# Patient Record
Sex: Male | Born: 1935 | Race: White | Hispanic: No | Marital: Married | Smoking: Former smoker
Health system: Southern US, Community
[De-identification: ages and names within clinical notes are randomized; demographics above are authoritative.]

## PROBLEM LIST (undated history)

## (undated) DIAGNOSIS — I10 Essential (primary) hypertension: Secondary | ICD-10-CM

## (undated) DIAGNOSIS — E785 Hyperlipidemia, unspecified: Secondary | ICD-10-CM

## (undated) DIAGNOSIS — K648 Other hemorrhoids: Secondary | ICD-10-CM

## (undated) DIAGNOSIS — N400 Enlarged prostate without lower urinary tract symptoms: Secondary | ICD-10-CM

## (undated) DIAGNOSIS — K449 Diaphragmatic hernia without obstruction or gangrene: Secondary | ICD-10-CM

## (undated) DIAGNOSIS — K219 Gastro-esophageal reflux disease without esophagitis: Secondary | ICD-10-CM

## (undated) DIAGNOSIS — J189 Pneumonia, unspecified organism: Secondary | ICD-10-CM

## (undated) DIAGNOSIS — K635 Polyp of colon: Secondary | ICD-10-CM

## (undated) DIAGNOSIS — R3129 Other microscopic hematuria: Secondary | ICD-10-CM

## (undated) HISTORY — PX: NOSE SURGERY: SHX723

## (undated) HISTORY — DX: Benign prostatic hyperplasia without lower urinary tract symptoms: N40.0

## (undated) HISTORY — DX: Pneumonia, unspecified organism: J18.9

## (undated) HISTORY — DX: Other microscopic hematuria: R31.29

## (undated) HISTORY — DX: Hyperlipidemia, unspecified: E78.5

## (undated) HISTORY — PX: TONSILLECTOMY: SUR1361

## (undated) HISTORY — DX: Diaphragmatic hernia without obstruction or gangrene: K44.9

## (undated) HISTORY — DX: Polyp of colon: K63.5

## (undated) HISTORY — PX: WISDOM TOOTH EXTRACTION: SHX21

## (undated) HISTORY — DX: Essential (primary) hypertension: I10

## (undated) HISTORY — DX: Other hemorrhoids: K64.8

## (undated) HISTORY — PX: COLONOSCOPY: SHX174

## (undated) HISTORY — DX: Gastro-esophageal reflux disease without esophagitis: K21.9

---

## 1996-08-25 ENCOUNTER — Encounter: Payer: Self-pay | Admitting: Internal Medicine

## 1999-03-27 DIAGNOSIS — R3129 Other microscopic hematuria: Secondary | ICD-10-CM

## 1999-03-27 HISTORY — DX: Other microscopic hematuria: R31.29

## 1999-09-04 ENCOUNTER — Encounter: Payer: Self-pay | Admitting: Internal Medicine

## 1999-10-05 ENCOUNTER — Encounter: Payer: Self-pay | Admitting: Internal Medicine

## 2001-03-26 HISTORY — PX: UPPER GASTROINTESTINAL ENDOSCOPY: SHX188

## 2001-06-23 ENCOUNTER — Encounter: Payer: Self-pay | Admitting: Internal Medicine

## 2001-07-03 ENCOUNTER — Encounter (INDEPENDENT_AMBULATORY_CARE_PROVIDER_SITE_OTHER): Payer: Self-pay | Admitting: Gastroenterology

## 2001-08-15 ENCOUNTER — Encounter: Payer: Self-pay | Admitting: Internal Medicine

## 2002-09-30 ENCOUNTER — Encounter: Payer: Self-pay | Admitting: Internal Medicine

## 2004-02-04 ENCOUNTER — Ambulatory Visit: Payer: Self-pay | Admitting: Internal Medicine

## 2004-03-14 ENCOUNTER — Ambulatory Visit: Payer: Self-pay | Admitting: Internal Medicine

## 2004-07-24 ENCOUNTER — Ambulatory Visit: Payer: Self-pay | Admitting: Internal Medicine

## 2004-12-11 ENCOUNTER — Ambulatory Visit: Payer: Self-pay | Admitting: Gastroenterology

## 2004-12-28 ENCOUNTER — Ambulatory Visit: Payer: Self-pay | Admitting: Gastroenterology

## 2005-01-04 ENCOUNTER — Ambulatory Visit: Payer: Self-pay | Admitting: Internal Medicine

## 2005-04-10 ENCOUNTER — Ambulatory Visit: Payer: Self-pay | Admitting: Internal Medicine

## 2005-04-11 ENCOUNTER — Encounter: Payer: Self-pay | Admitting: Internal Medicine

## 2005-04-11 ENCOUNTER — Encounter: Admission: RE | Admit: 2005-04-11 | Discharge: 2005-04-11 | Payer: Self-pay | Admitting: Internal Medicine

## 2005-05-14 ENCOUNTER — Ambulatory Visit: Payer: Self-pay | Admitting: Internal Medicine

## 2005-05-18 ENCOUNTER — Encounter: Admission: RE | Admit: 2005-05-18 | Discharge: 2005-05-18 | Payer: Self-pay | Admitting: Internal Medicine

## 2006-01-16 ENCOUNTER — Ambulatory Visit: Payer: Self-pay | Admitting: Internal Medicine

## 2006-02-11 ENCOUNTER — Ambulatory Visit: Payer: Self-pay | Admitting: Internal Medicine

## 2006-02-11 LAB — CONVERTED CEMR LAB
ALT: 90 units/L — ABNORMAL HIGH (ref 0–40)
AST: 74 units/L — ABNORMAL HIGH (ref 0–37)
Albumin: 3.2 g/dL — ABNORMAL LOW (ref 3.5–5.2)
Basophils Absolute: 0 10*3/uL (ref 0.0–0.1)
Hemoglobin: 12.3 g/dL — ABNORMAL LOW (ref 13.0–17.0)
Lymphocytes Relative: 21.6 % (ref 12.0–46.0)
MCHC: 32.8 g/dL (ref 30.0–36.0)
MCV: 93.2 fL (ref 78.0–100.0)
Monocytes Relative: 9.5 % (ref 3.0–11.0)
Neutro Abs: 3 10*3/uL (ref 1.4–7.7)
Platelets: 218 10*3/uL (ref 150–400)
RBC: 4.04 M/uL — ABNORMAL LOW (ref 4.22–5.81)
WBC: 4.8 10*3/uL (ref 4.5–10.5)

## 2006-02-18 ENCOUNTER — Ambulatory Visit: Payer: Self-pay | Admitting: Internal Medicine

## 2006-02-18 LAB — CONVERTED CEMR LAB
ALT: 41 units/L — ABNORMAL HIGH (ref 0–40)
Albumin: 3.3 g/dL — ABNORMAL LOW (ref 3.5–5.2)
Alkaline Phosphatase: 64 units/L (ref 39–117)
Basophils Absolute: 0.1 10*3/uL (ref 0.0–0.1)
Eosinophil percent: 4 % (ref 0.0–5.0)
HCT: 38.2 % — ABNORMAL LOW (ref 39.0–52.0)
Hemoglobin: 13 g/dL (ref 13.0–17.0)
Lymphocytes Relative: 29.4 % (ref 12.0–46.0)
MCHC: 34 g/dL (ref 30.0–36.0)
Monocytes Relative: 10.3 % (ref 3.0–11.0)
Neutro Abs: 3.3 10*3/uL (ref 1.4–7.7)
Neutrophils Relative %: 54.3 % (ref 43.0–77.0)
Platelets: 430 10*3/uL — ABNORMAL HIGH (ref 150–400)
RBC: 4.09 M/uL — ABNORMAL LOW (ref 4.22–5.81)
Total Bilirubin: 1 mg/dL (ref 0.3–1.2)
Total Protein: 6.2 g/dL (ref 6.0–8.3)

## 2006-03-07 ENCOUNTER — Ambulatory Visit: Payer: Self-pay | Admitting: Internal Medicine

## 2006-07-15 DIAGNOSIS — Z8709 Personal history of other diseases of the respiratory system: Secondary | ICD-10-CM | POA: Insufficient documentation

## 2006-07-15 DIAGNOSIS — I10 Essential (primary) hypertension: Secondary | ICD-10-CM | POA: Insufficient documentation

## 2006-07-15 DIAGNOSIS — E782 Mixed hyperlipidemia: Secondary | ICD-10-CM | POA: Insufficient documentation

## 2006-07-16 ENCOUNTER — Ambulatory Visit: Payer: Self-pay | Admitting: Internal Medicine

## 2006-07-16 LAB — CONVERTED CEMR LAB
Alkaline Phosphatase: 56 units/L (ref 39–117)
BUN: 26 mg/dL — ABNORMAL HIGH (ref 6–23)
Basophils Relative: 0 % (ref 0.0–1.0)
Bilirubin, Direct: 0.1 mg/dL (ref 0.0–0.3)
CO2: 30 meq/L (ref 19–32)
Chloride: 108 meq/L (ref 96–112)
Cholesterol: 156 mg/dL (ref 0–200)
Creatinine, Ser: 1.1 mg/dL (ref 0.4–1.5)
Eosinophils Absolute: 0.1 10*3/uL (ref 0.0–0.6)
Eosinophils Relative: 2.4 % (ref 0.0–5.0)
Folate: >20 ng/mL
GFR calc Af Amer: 84 mL/min
GFR calc non Af Amer: 70 mL/min
Glucose, Bld: 99 mg/dL (ref 70–99)
HCT: 41.2 % (ref 39.0–52.0)
HDL: 45 mg/dL (ref >39.0)
Iron: 137 ug/dL (ref 42–165)
MCV: 93.2 fL (ref 78.0–100.0)
Neutrophils Relative %: 46.5 % (ref 43.0–77.0)
PSA: 0.48 ng/mL (ref 0.10–4.00)
Platelets: 241 10*3/uL (ref 150–400)
Potassium: 4.6 meq/L (ref 3.5–5.1)
RBC: 4.42 M/uL (ref 4.22–5.81)
Saturation Ratios: 45.2 % (ref 20.0–50.0)
Sodium: 143 meq/L (ref 135–145)
Total CHOL/HDL Ratio: 3.5
Transferrin: 216.5 mg/dL (ref >=212.0)
VLDL: 13 mg/dL (ref 0–40)
WBC: 4.8 10*3/uL (ref 4.5–10.5)

## 2006-12-16 ENCOUNTER — Telehealth (INDEPENDENT_AMBULATORY_CARE_PROVIDER_SITE_OTHER): Payer: Self-pay | Admitting: *Deleted

## 2007-01-21 ENCOUNTER — Ambulatory Visit: Payer: Self-pay | Admitting: Internal Medicine

## 2007-01-30 ENCOUNTER — Ambulatory Visit: Payer: Self-pay | Admitting: Internal Medicine

## 2007-02-24 ENCOUNTER — Telehealth (INDEPENDENT_AMBULATORY_CARE_PROVIDER_SITE_OTHER): Payer: Self-pay | Admitting: *Deleted

## 2007-02-25 ENCOUNTER — Ambulatory Visit: Payer: Self-pay | Admitting: Internal Medicine

## 2007-02-26 ENCOUNTER — Encounter: Payer: Self-pay | Admitting: Internal Medicine

## 2007-07-09 ENCOUNTER — Ambulatory Visit: Payer: Self-pay | Admitting: Internal Medicine

## 2007-07-09 DIAGNOSIS — F528 Other sexual dysfunction not due to a substance or known physiological condition: Secondary | ICD-10-CM | POA: Insufficient documentation

## 2007-07-09 LAB — CONVERTED CEMR LAB
Bilirubin Urine: NEGATIVE
Cholesterol, target level: 200 mg/dL
Ketones, urine, test strip: NEGATIVE
LDL Goal: 130 mg/dL
Specific Gravity, Urine: 1.01
Urobilinogen, UA: NEGATIVE
pH: 6

## 2007-07-13 LAB — CONVERTED CEMR LAB
AST: 20 units/L (ref 0–37)
BUN: 21 mg/dL (ref 6–23)
Basophils Absolute: 0 10*3/uL (ref 0.0–0.1)
Basophils Relative: 0 % (ref 0.0–1.0)
Eosinophils Absolute: 0.1 10*3/uL (ref 0.0–0.7)
Eosinophils Relative: 2 % (ref 0.0–5.0)
HCT: 40.5 % (ref 39.0–52.0)
Hemoglobin: 13.8 g/dL (ref 13.0–17.0)
Lymphocytes Relative: 39.4 % (ref 12.0–46.0)
MCHC: 34 g/dL (ref 30.0–36.0)
Monocytes Absolute: 0.4 10*3/uL (ref 0.1–1.0)
Monocytes Relative: 10.2 % (ref 3.0–12.0)
PSA: 0.46 ng/mL (ref 0.10–4.00)
Potassium: 4.1 meq/L (ref 3.5–5.1)
RBC: 4.34 M/uL (ref 4.22–5.81)
TSH: 2.06 microintl units/mL (ref 0.35–5.50)
Total Bilirubin: 1 mg/dL (ref 0.3–1.2)
Triglycerides: 58 mg/dL (ref 0–149)
VLDL: 12 mg/dL (ref 0–40)

## 2007-07-14 ENCOUNTER — Encounter (INDEPENDENT_AMBULATORY_CARE_PROVIDER_SITE_OTHER): Payer: Self-pay | Admitting: *Deleted

## 2008-01-01 ENCOUNTER — Encounter (INDEPENDENT_AMBULATORY_CARE_PROVIDER_SITE_OTHER): Payer: Self-pay | Admitting: *Deleted

## 2008-03-26 DIAGNOSIS — K449 Diaphragmatic hernia without obstruction or gangrene: Secondary | ICD-10-CM

## 2008-03-26 HISTORY — DX: Diaphragmatic hernia without obstruction or gangrene: K44.9

## 2008-08-06 ENCOUNTER — Telehealth (INDEPENDENT_AMBULATORY_CARE_PROVIDER_SITE_OTHER): Payer: Self-pay | Admitting: *Deleted

## 2008-09-15 ENCOUNTER — Ambulatory Visit: Payer: Self-pay | Admitting: Internal Medicine

## 2008-09-15 DIAGNOSIS — R930 Abnormal findings on diagnostic imaging of skull and head, not elsewhere classified: Secondary | ICD-10-CM | POA: Insufficient documentation

## 2008-09-17 ENCOUNTER — Ambulatory Visit: Payer: Self-pay | Admitting: Internal Medicine

## 2008-09-20 ENCOUNTER — Telehealth (INDEPENDENT_AMBULATORY_CARE_PROVIDER_SITE_OTHER): Payer: Self-pay | Admitting: *Deleted

## 2008-09-23 ENCOUNTER — Encounter (INDEPENDENT_AMBULATORY_CARE_PROVIDER_SITE_OTHER): Payer: Self-pay | Admitting: *Deleted

## 2008-10-15 ENCOUNTER — Ambulatory Visit: Payer: Self-pay | Admitting: Internal Medicine

## 2008-10-15 DIAGNOSIS — R1319 Other dysphagia: Secondary | ICD-10-CM | POA: Insufficient documentation

## 2008-10-15 DIAGNOSIS — K219 Gastro-esophageal reflux disease without esophagitis: Secondary | ICD-10-CM | POA: Insufficient documentation

## 2008-10-28 ENCOUNTER — Ambulatory Visit: Payer: Self-pay | Admitting: Internal Medicine

## 2008-11-01 ENCOUNTER — Telehealth (INDEPENDENT_AMBULATORY_CARE_PROVIDER_SITE_OTHER): Payer: Self-pay | Admitting: *Deleted

## 2009-07-28 ENCOUNTER — Ambulatory Visit: Payer: Self-pay | Admitting: Internal Medicine

## 2009-07-29 ENCOUNTER — Ambulatory Visit: Payer: Self-pay | Admitting: Internal Medicine

## 2009-07-29 ENCOUNTER — Telehealth: Payer: Self-pay | Admitting: Internal Medicine

## 2009-07-29 LAB — CONVERTED CEMR LAB
Eosinophils Absolute: 0.1 10*3/uL (ref 0.0–0.7)
Eosinophils Relative: 2.6 % (ref 0.0–5.0)
HCT: 39.9 % (ref 39.0–52.0)
Lymphocytes Relative: 32.1 % (ref 12.0–46.0)
Lymphs Abs: 1.7 10*3/uL (ref 0.7–4.0)
MCHC: 34.7 g/dL (ref 30.0–36.0)
Monocytes Relative: 9.9 % (ref 3.0–12.0)
WBC: 5.2 10*3/uL (ref 4.5–10.5)

## 2009-08-01 ENCOUNTER — Ambulatory Visit: Payer: Self-pay | Admitting: Internal Medicine

## 2009-08-01 ENCOUNTER — Telehealth (INDEPENDENT_AMBULATORY_CARE_PROVIDER_SITE_OTHER): Payer: Self-pay | Admitting: *Deleted

## 2009-08-01 ENCOUNTER — Encounter: Admission: RE | Admit: 2009-08-01 | Discharge: 2009-08-01 | Payer: Self-pay | Admitting: Internal Medicine

## 2009-08-01 LAB — CONVERTED CEMR LAB
BUN: 24 mg/dL — ABNORMAL HIGH (ref 6–23)
Creatinine, Ser: 1.1 mg/dL (ref 0.4–1.5)

## 2009-08-09 ENCOUNTER — Ambulatory Visit: Payer: Self-pay | Admitting: Internal Medicine

## 2009-08-09 DIAGNOSIS — J479 Bronchiectasis, uncomplicated: Secondary | ICD-10-CM | POA: Insufficient documentation

## 2009-08-09 DIAGNOSIS — R918 Other nonspecific abnormal finding of lung field: Secondary | ICD-10-CM | POA: Insufficient documentation

## 2009-08-09 DIAGNOSIS — J984 Other disorders of lung: Secondary | ICD-10-CM

## 2009-10-03 ENCOUNTER — Ambulatory Visit: Payer: Self-pay | Admitting: Internal Medicine

## 2009-10-03 DIAGNOSIS — M545 Low back pain, unspecified: Secondary | ICD-10-CM | POA: Insufficient documentation

## 2009-10-04 ENCOUNTER — Telehealth: Payer: Self-pay | Admitting: Internal Medicine

## 2009-11-25 ENCOUNTER — Encounter: Payer: Self-pay | Admitting: Internal Medicine

## 2009-12-31 ENCOUNTER — Encounter: Payer: Self-pay | Admitting: Internal Medicine

## 2010-04-23 LAB — CONVERTED CEMR LAB
ALT: 19 units/L (ref 0–53)
ALT: 22 units/L (ref 0–53)
AST: 23 units/L (ref 0–37)
Albumin: 4.2 g/dL (ref 3.5–5.2)
Alkaline Phosphatase: 47 units/L (ref 39–117)
Alkaline Phosphatase: 54 units/L (ref 39–117)
BUN: 25 mg/dL — ABNORMAL HIGH (ref 6–23)
Basophils Relative: 1 % (ref 0.0–3.0)
Bilirubin, Direct: 0.1 mg/dL (ref 0.0–0.3)
CO2: 26 meq/L (ref 19–32)
Calcium: 9.3 mg/dL (ref 8.4–10.5)
Calcium: 9.3 mg/dL (ref 8.4–10.5)
Chloride: 107 meq/L (ref 96–112)
Cholesterol: 168 mg/dL (ref 0–200)
Creatinine, Ser: 1 mg/dL (ref 0.4–1.5)
Eosinophils Relative: 3 % (ref 0.0–5.0)
Glucose, Bld: 99 mg/dL (ref 70–99)
HDL: 44.1 mg/dL (ref >39.00)
Ketones, urine, test strip: NEGATIVE
LDL Cholesterol: 109 mg/dL — ABNORMAL HIGH (ref 0–99)
Lymphocytes Relative: 33.5 % (ref 12.0–46.0)
Lymphocytes Relative: 38 % (ref 12.0–46.0)
MCHC: 34.8 g/dL (ref 30.0–36.0)
MCV: 94 fL (ref 78.0–100.0)
Monocytes Relative: 10.2 % (ref 3.0–12.0)
Monocytes Relative: 9.3 % (ref 3.0–12.0)
Neutro Abs: 2.6 10*3/uL (ref 1.4–7.7)
Neutrophils Relative %: 49 % (ref 43.0–77.0)
Neutrophils Relative %: 52 % (ref 43.0–77.0)
Nitrite: NEGATIVE
Protein, U semiquant: NEGATIVE
RBC: 4.42 M/uL (ref 4.22–5.81)
RDW: 13.9 % (ref 11.5–14.6)
TSH: 1.52 microintl units/mL (ref 0.35–5.50)
TSH: 1.9 microintl units/mL (ref 0.35–5.50)
Total Bilirubin: 0.9 mg/dL (ref 0.3–1.2)
Total Bilirubin: 1.2 mg/dL (ref 0.3–1.2)
Total Protein: 6.4 g/dL (ref 6.0–8.3)
Total Protein: 7 g/dL (ref 6.0–8.3)
Triglycerides: 54 mg/dL (ref 0.0–149.0)
Triglycerides: 58 mg/dL (ref 0.0–149.0)
Urobilinogen, UA: NEGATIVE
VLDL: 11.6 mg/dL (ref 0.0–40.0)
WBC: 5 10*3/uL (ref 4.5–10.5)

## 2010-04-26 NOTE — Progress Notes (Signed)
----   Converted from flag ---- ---- 08/01/2009 8:55 AM, Okey Regal [place] wrote: ...08/01/09 called patient to schedule - patient wife said he was scheduled [id number] lab scheduled [id number]  ---- 08/01/2009 8:55 AM, Shonna Chock wrote: Please scheduled patient for labs DX:V72.84  ---- 08/01/2009 7:29 AM, Marga Melnick MD wrote: he would need BUN , creat  pre CT with contrast ------------------------------

## 2010-04-26 NOTE — Assessment & Plan Note (Signed)
Summary: rto after finished meds.cbs   Vital Signs:  Patient profile:   77 year old male Weight:      164 pounds Temp:     98.4 degrees F oral Pulse rate:   60 / minute Resp:     14 per minute BP sitting:   126 / 88  (left arm)  Vitals Entered By: Jeremy Johann CMA (Aug 09, 2009 4:32 PM) CC: f/u med complete Comments REVIEWED MED LIST, PATIENT AGREED DOSE AND INSTRUCTION CORRECT    Primary Care Provider:  Marga Melnick, MD  CC:  f/u med complete.  History of Present Illness: Bronchitis has resolved; residual PNDrainage which is chronic. CT scan done due to possible descending thoracic aneurysmon routine CXray. This revealed RUL nodules & RML & lingular bronchiectasis. Findings discussed; PET scan not recommended due to concept of "doubling time" of nodes.  Allergies (verified): No Known Drug Allergies  Review of Systems General:  Denies chills, fever, and sweats. ENT:  Denies nasal congestion and sinus pressure; Frontal headache, facial pain or purulence . Resp:  Complains of wheezing; denies chest pain with inspiration, cough, coughing up blood, shortness of breath, and sputum productive; Low grade wheezing.  Physical Exam  General:  Thin , appears younger than age,well-nourished,in no acute distress; alert,appropriate and cooperative throughout examination Ears:  External ear exam shows no significant lesions or deformities.  Otoscopic examination reveals clear canals, tympanic membranes are intact bilaterally without bulging, retraction, inflammation or discharge. Hearingaid on R Nose:  External nasal examination shows no deformity or inflammation. Nasal mucosa are pink and moist without lesions or exudates. Mouth:  Oral mucosa and oropharynx without lesions or exudates.  Teeth in good repair. Lungs:  normal respiratory effort, no intercostal retractions, no accessory muscle use, normal breath sounds, no dullness, no fremitus, no crackles, and no wheezes.  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 Once daily-need office visit 2)  Pravastatin Sodium 40 Mg Tabs (Pravastatin sodium) .... Once daily 3)  Tgt Omeprazole 20 Mg Tbec (Omeprazole) .Marland Kitchen.. 1 qam 30 min ac b'fast 4)  Adult Aspirin Low Strength 81 Mg Tbdp (Aspirin) .Marland Kitchen.. 1 by mouth once daily 5)  Claritin 10 Mg Tabs (Loratadine) .Marland Kitchen.. 1 by mouth once daily 6)  Multivitamins Tabs (Multiple vitamin) .Marland Kitchen.. 1 by mouth once daily 7)  Viagra 100 Mg Tabs (Sildenafil citrate) .... Once daily prn 8)  Zovirax 5 % Crea (Acyclovir) .... Apply as directed  Patient Instructions: 1)  Annual Flu shot & Pneumovax every 10 years. Treat lung infections early

## 2010-04-26 NOTE — Assessment & Plan Note (Signed)
Summary: cpx/kdc   Vital Signs:  Patient profile:   77 year old male Height:      70.5 inches Weight:      166.0 pounds BMI:     23.57 Temp:     98.3 degrees F oral Pulse rate:   64 / minute Resp:     14 per minute BP sitting:   128 / 80  (left arm) Cuff size:   large  Vitals Entered By: Shonna Chock (October 03, 2009 8:25 AM) CC: CPX with fasting labs , Back pain, Lipid Management Comments REVIEWED MED LIST, PATIENT AGREED DOSE AND INSTRUCTION CORRECT   **Patient states TD Vaccine UTD, Up dated at urgent care**   Primary Care Provider:  Marga Melnick, MD  CC:  CPX with fasting labs , Back pain, and Lipid Management.  History of Present Illness: Here for Medicare AWV: 1.Risk factors based on Past M, S, F history: dyslipidemia, HTN, LBP(active issue) 2.Physical Activities: see Entry data 3.Depression/mood: denied 4.Hearing: aid on R  from  [facility] ,seen annually 5.ADL's: no limitations 6.Fall Risk: none 7.Home Safety: no risk 8.Height, weight, &visual acuity:see VS;vision intact with lenses (read wall chart); Dr Hazle Quant seen annually 9.Counseling: Guidelines as to treatment of  LBP requested 10.Labs ordered based on risk factors: see Orders 11.Referral Coordination: see Orders 12.Care Plan: see Instructions; POA in place 13.Cognitive Assessment:Oriented X 3; memory & recall intact; mood /affect normal Hyperlipidemia Follow-Up      This is a 77 year old man who presents for Hyperlipidemia follow-up.  The patient denies muscle aches, GI upset, abdominal pain, flushing, itching, constipation, diarrhea, and fatigue.  The patient denies the following symptoms: chest pain/pressure, exercise intolerance, dypsnea, palpitations, syncope, and pedal edema.  Compliance with medications (by patient report) has been near 100%.  Dietary compliance has been good.  Adjunctive measures currently used by the patient include ASA.   Hypertension Follow-Up      The patient also presents for  Hypertension follow-up.  The patient denies lightheadedness, urinary frequency, headaches, and rash.  Compliance with medications (by patient report) has been near 100%.  Adjunctive measures currently used by the patient include salt restriction.  Back Pain      The patient also presents with Back pain for 3-5 months.  The patient reports rest pain, but denies fever, chills, weakness, loss of sensation, fecal incontinence, urinary incontinence, urinary retention, dysuria, and inability to care for self.  The pain is located in the mid low back.  The pain began at home and gradually.  The pain radiates to the right leg below the knee and left leg above  the knee.  The pain is made worse by lying down.  The pain is made better by activity and NSAID medications (ADVIL).  Risk factors for serious underlying conditions include age >= 50 years.    Lipid Management History:      Positive NCEP/ATP III risk factors include male age 47 years old or older and hypertension.  Negative NCEP/ATP III risk factors include non-diabetic, no family history for ischemic heart disease, non-tobacco-user status, no ASHD (atherosclerotic heart disease), no prior stroke/TIA, no peripheral vascular disease, and no history of aortic aneurysm.     Preventive Screening-Counseling & Management  Alcohol-Tobacco     Alcohol drinks/day: <1     Smoking Status: quit > 6 months     Packs/Day: 1.0     Year Started: 1953     Year Quit: 1967  Caffeine-Diet-Exercise  Caffeine use/day: 2 cups / day     Diet Comments: no specific diet     Does Patient Exercise: yes     Type of exercise: calisthentics, walking     Exercise (avg: min/session): 30 min     Times/week: 4     Exercise Counseling: not indicated; exercise is adequate  Hep-HIV-STD-Contraception     Dental Visit-last 6 months yes  Safety-Violence-Falls     Seat Belt Use: yes     Firearms in the Home: firearms in the home     Firearm Counseling: not indicated; uses  recommended firearm safety measures     Smoke Detectors: yes     Violence in the Home: no risk noted     Sexual Abuse: no     Fall Risk: none      Sexual History:  currently monogamous.        Drug Use:  never.        Blood Transfusions:  no.    Allergies (verified): No Known Drug Allergies  Past History:  Past Medical History: Hyperlipidemia: [place] Study LDL goal = < 130 Hypertension Benign prostatic hypertrophy GERD Pneumonia, hx of, (High School) with residual scarring on CXray (tiny nodules on CT scan 2007) Hemorrhoids  Past Surgical History: Endo : ERD 2003 Colonoscopy negative  2001 & 2006, Dr Victorino Dike ;Sinus surgery; Tonsillectomy  Social History: Caffeine use/day:  2 cups / day Smoking Status:  quit > 6 months Packs/Day:  1.0 Dental Care w/in 6 mos.:  yes Seat Belt Use:  yes Fall Risk:  none Sexual History:  currently monogamous Drug Use:  never Blood Transfusions:  no  Review of Systems  The patient denies anorexia, weight loss, weight gain, hoarseness, prolonged cough, hemoptysis, abdominal pain, melena, hematochezia, severe indigestion/heartburn, hematuria, incontinence, suspicious skin lesions, unusual weight change, abnormal bleeding, enlarged lymph nodes, and angioedema.    Physical Exam  General:  Appears younger than age,well-nourished; alert,appropriate and cooperative throughout examination Head:  Normocephalic and atraumatic without obvious abnormalities. No apparent alopecia  Eyes:  No corneal or conjunctival inflammation noted. Perrla. Funduscopic exam benign, without hemorrhages, exudates or papilledema. Vision grossly normal. Ears:  External ear exam shows no significant lesions or deformities.  Otoscopic examination reveals clear canals, tympanic membranes are intact bilaterally without bulging, retraction, inflammation or discharge. Aid on R Nose:  External nasal examination shows no deformity or inflammation. Nasal mucosa are pink  and moist without lesions or exudates. Mouth:  Oral mucosa and oropharynx without lesions or exudates.  Teeth in good repair. Neck:  No deformities, masses, or tenderness noted. Lungs:  Normal respiratory effort, chest expands symmetrically. Lungs are clear to auscultation, no crackles or wheezes. Heart:  Normal rate and regular rhythm. S1 and S2 normal without gallop, murmur, click, rub . S4 with slurring Abdomen:  Bowel sounds positive,abdomen soft and non-tender without masses, organomegaly or hernias noted. Rectal:  external hemorrhoidal tags Genitalia:  Testes bilaterally descended without nodularity, tenderness or masses. No scrotal masses or lesions. No penis lesions or urethral discharge. Prostate:  no nodules, no induration, and 1.5+ enlarged.   Pulses:  R and L carotid,radial,dorsalis pedis and posterior tibial pulses are full and equal bilaterally Extremities:  No clubbing, cyanosis, edema, or deformity noted with normal full range of motion of all joints.  Neg SLR Neurologic:  alert & oriented X3, strength normal in all extremities, gait normal, and DTRs symmetrical and normal.   Skin:  Intact without suspicious lesions or rashes Cervical  Nodes:  No lymphadenopathy noted Axillary Nodes:  No palpable lymphadenopathy Inguinal Nodes:  No significant adenopathy Psych:  memory intact for recent and remote, normally interactive, good eye contact, not anxious appearing, and not depressed appearing.     Impression & Recommendations:  Problem # 1:  PREVENTIVE HEALTH CARE (ICD-V70.0)  Orders: Subsequent annual wellness visit with prevention plan (Z6109) TLB-CBC Platelet - w/Differential (85025-CBCD) UA Dipstick W/ Micro (manual) (60454)  Problem # 2:  LOW BACK PAIN SYNDROME (ICD-724.2) No neuromuscular deficit; no evidence of disc rupture clinically,chronic recurrent LB Syndrome suggested. Imaging & referral  not indicated based on benign findings. Such will be pursued with change in  condition or findings. His updated medication list for this problem includes:    Adult Aspirin Low Strength 81 Mg Tbdp (Aspirin) .Marland Kitchen... 1 by mouth once daily    Tramadol Hcl 50 Mg Tabs (Tramadol hcl) .Marland Kitchen... 1 every 6 hrs as needed pain  Problem # 3:  HYPERTENSION (ICD-401.9)  controlled His updated medication list for this problem includes:    Amlodipine Besylate 5 Mg Tabs (Amlodipine besylate) ..... Once daily  Orders: EKG w/ Interpretation (93000) Venipuncture (09811) TLB-BMP (Basic Metabolic Panel-BMET) (80048-METABOL)  Problem # 4:  HYPERLIPIDEMIA (ICD-272.4)  His updated medication list for this problem includes:    Pravastatin Sodium 40 Mg Tabs (Pravastatin sodium) .Marland Kitchen... At bedtime  Orders: Venipuncture (91478) TLB-Lipid Panel (80061-LIPID) TLB-Hepatic/Liver Function Pnl (80076-HEPATIC) TLB-TSH (Thyroid Stimulating Hormone) (84443-TSH)  Complete Medication List: 1)  Amlodipine Besylate 5 Mg Tabs (Amlodipine besylate) .... Once daily 2)  Pravastatin Sodium 40 Mg Tabs (Pravastatin sodium) .... At bedtime 3)  Tgt Omeprazole 20 Mg Tbec (Omeprazole) .Marland Kitchen.. 1 qam 30 min ac b'fast 4)  Adult Aspirin Low Strength 81 Mg Tbdp (Aspirin) .Marland Kitchen.. 1 by mouth once daily 5)  Claritin 10 Mg Tabs (Loratadine) .Marland Kitchen.. 1 by mouth once daily 6)  Multivitamins Tabs (Multiple vitamin) .Marland Kitchen.. 1 by mouth once daily 7)  Viagra 100 Mg Tabs (Sildenafil citrate) .... Once daily prn 8)  Zovirax 5 % Crea (Acyclovir) .... Apply as directed 9)  Tramadol Hcl 50 Mg Tabs (Tramadol hcl) .Marland Kitchen.. 1 every 6 hrs as needed pain  Other Orders: Pneumococcal Vaccine (29562) Admin 1st Vaccine (13086)  Lipid Assessment/Plan:      Based on NCEP/ATP III, the patient's risk factor category is "0-1 risk factors".  The patient's lipid goals are as follows: Total cholesterol goal is 200; LDL cholesterol goal is 130; HDL cholesterol goal is 40; Triglyceride goal is 150.  His LDL cholesterol goal has been met.    Patient  Instructions: 1)  Complete stool cards.Call if back flares.It is important that you exercise regularly at least 20 minutes 5 times a week. If you develop chest pain, have severe difficulty breathing, or feel very tired , stop exercising immediately and seek medical attention. 2)  Take an 81 mg COATED  Aspirin every day with breakfast. call if the BP progresses; Xrays & referral would be considered. Prescriptions: TRAMADOL HCL 50 MG TABS (TRAMADOL HCL) 1 every 6 hrs as needed pain  #30 x 2   Entered and Authorized by:   Marga Melnick MD   Signed by:   Marga Melnick MD on 10/03/2009   Method used:   Faxed to ...       [facility]  [address] [phone]* (retail)       [address]       [place], [place]  [address]  Ph: [phone]       Fax: [phone]   RxID:   [id number] VIAGRA 100 MG  TABS (SILDENAFIL CITRATE) once daily prn  #6 x 6   Entered and Authorized by:   Marga Melnick MD   Signed by:   Marga Melnick MD on 10/03/2009   Method used:   Faxed to ...       [facility]  [address] [phone]* (retail)       [address]       [place], [place]  [address]       Ph: [phone]       Fax: [phone]   RxID:   [id number] OMEPRAZOLE 20 MG  TBEC (OMEPRAZOLE) 1 qam 30 min ac b'fast  #90 x 3   Entered and Authorized by:   Marga Melnick MD   Signed by:   Marga Melnick MD on 10/03/2009   Method used:   Faxed to ...       [facility]  [address] [phone]* (retail)       [address]       [place], [place]  [address]       Ph: [phone]       Fax: [phone]   RxID:   [id number] PRAVASTATIN SODIUM 40 MG TABS (PRAVASTATIN SODIUM) at bedtime  #90 x 3   Entered and Authorized by:   Marga Melnick MD   Signed by:   Marga Melnick MD on 10/03/2009   Method used:   Faxed to ...       [facility]  [address] [phone]* (retail)       [address]       [place], [place]  [address]       Ph: [phone]       Fax: [phone]   RxID:   [id number] AMLODIPINE BESYLATE 5 MG TABS (AMLODIPINE  BESYLATE) once daily  #90 x 3   Entered and Authorized by:   Marga Melnick MD   Signed by:   Marga Melnick MD on 10/03/2009   Method used:   Faxed to ...       [facility]  [address] [phone]* (retail)       [address]       [place], [place]  [address]       Ph: [phone]       Fax: [phone]   RxID:   [id number]    Immunizations Administered:  Pneumonia Vaccine:    Vaccine Type: Pneumovax (Medicare)    Site: right deltoid    Mfr: Merck    Dose: 0.5 ml    Route: IM    Given by: Shonna Chock    Exp. Date: 04/12/2011    Lot #: [id number]    Laboratory Results   Urine Tests   Date/Time Reported: October 03, 2009 11:13 AM   Routine Urinalysis   Color: yellow Appearance: Clear Glucose: negative   (Normal Range: Negative) Bilirubin: negative   (Normal Range: Negative) Ketone: negative   (Normal Range: Negative) Spec. Gravity: 1.020   (Normal Range: 1.003-1.035) Blood: negative   (Normal Range: Negative) pH: 5.0   (Normal Range: 5.0-8.0) Protein: negative   (Normal Range: Negative) Urobilinogen: negative   (Normal Range: 0-1) Nitrite: negative   (Normal Range: Negative) Leukocyte Esterace: negative   (Normal Range: Negative)    Comments: Floydene Flock  October 03, 2009 11:13 AM

## 2010-04-26 NOTE — Letter (Signed)
Summary: Colonoscopy Letter  [facility]  [address] [place], [place] [address]   Phone: [phone]  Fax: [phone]      November 25, 2009 MRN: [id number]   AKIA DESROCHES [address] [place], [place]  [address]   Dear Mr. DASS,   According to your medical record, it is time for you to schedule a Colonoscopy. The American Cancer Society recommends this procedure as a method to detect early colon cancer. Patients with a family history of colon cancer, or a personal history of colon polyps or inflammatory bowel disease are at increased risk.  This letter has been generated based on the recommendations made at the time of your procedure. If you feel that in your particular situation this may no longer apply, please contact our office.  Please call our office at [phone] to schedule this appointment or to update your records at your earliest convenience.  Thank you for cooperating with [place] to provide you with the very best care possible.   Sincerely,  Wilhemina Bonito. Marina Goodell, M.D.  [facility] [phone]

## 2010-04-26 NOTE — Progress Notes (Signed)
Summary: TD update  Phone Note Call from Patient Call back at Home Phone [phone]   Summary of Call: Patient called to notify that his last TD was 07/23/05 at [facility]. Updated chart and closed phone note. Initial call taken by: Lucious Groves,  October 04, 2009 2:29 PM          Preventive Care Screening  Last Tetanus Booster:    Date:  07/23/2005    Results:  Historical

## 2010-04-26 NOTE — Assessment & Plan Note (Signed)
Summary: spit up blood last night/cbs   Vital Signs:  Patient profile:   77 year old male Weight:      170 pounds BMI:     23.80 Temp:     98.5 degrees F oral Pulse rate:   64 / minute Resp:     17 per minute BP sitting:   124 / 86  (left arm) Cuff size:   large  Vitals Entered By: Shonna Chock (Jul 28, 2009 12:20 PM) CC: Spit up blood (bright red)-no other symptoms. Onse last Night Comments REVIEWED MED LIST, PATIENT AGREED DOSE AND INSTRUCTION CORRECT    Primary Care Provider:  Marga Melnick, MD  CC:  Spit up blood (bright red)-no other symptoms. Onse last Night.  History of Present Illness: Last night he noted deep thought congestion  ; when he had frank hemoptysis when  he cleared his throat.Total was 2 TBSP. He was not having any pulmonary symptoms. Similar episode 4 years ago in context of  RTI. he quit smoking in 1967. He smoked 14 years up to 1 ppd.PMH of PNA; he has had pneumovax in 2008. On 81 mg ASA once daily , no other anticoagulation  Allergies (verified): No Known Drug Allergies  Review of Systems General:  Denies chills, fever, sweats, and weight loss. ENT:  Complains of postnasal drainage and sinus pressure; denies nasal congestion and nosebleeds; No frontal headache, facial pain or purulence. CV:  Denies chest pain or discomfort. Resp:  Denies chest pain with inspiration, pleuritic, and shortness of breath; Occasional wheezing. No PMH of asthma. GI:  Denies bloody stools and dark tarry stools. GU:  Denies hematuria. Heme:  Denies abnormal bruising and bleeding.  Physical Exam  General:  Thin but well-nourished,in no acute distress; alert,appropriate and cooperative throughout examination; appears younger than age Ears:  External ear exam shows no significant lesions or deformities.  Otoscopic examination reveals clear canals, tympanic membranes are intact bilaterally without bulging, retraction, inflammation or discharge. Hearing aid on R Nose:  External  nasal examination shows no deformity or inflammation. Nasal mucosa are pink and moist without lesions or exudates. Mouth:  Oral mucosa and oropharynx without lesions or exudates.  Teeth in good repair. Lungs:  Normal respiratory effort, chest expands symmetrically. Lungs are clear to auscultation, no crackles or wheezes. Heart:  Normal rate and regular rhythm. S1 and S2 normal without gallop, murmur, click, rub. S4 with slurring Abdomen:  Bowel sounds positive,abdomen soft and non-tender without  organomegaly or hernias noted. Aorta palp[able, ? 3.5 cm @ umbilicus Skin:  Intact without suspicious lesions or rashes Cervical Nodes:  No lymphadenopathy noted Axillary Nodes:  No palpable lymphadenopathy Psych:  memory intact for recent and remote, normally interactive, and good eye contact.     Impression & Recommendations:  Problem # 1:  HEMOPTYSIS UNSPECIFIED (ICD-786.30)  Orders: Venipuncture (16109) TLB-CBC Platelet - w/Differential (85025-CBCD) TLB-PT (Protime) (85610-PTP) TLB-PTT (85730-PTTL) T-2 View CXR (71020TC) Prescription Created Electronically [phone])  Complete Medication List: 1)  Amlodipine Besylate 5 Mg Tabs (Amlodipine besylate) .... Once daily-need office visit 2)  Pravastatin Sodium 40 Mg Tabs (Pravastatin sodium) .... Once daily 3)  Tgt Omeprazole 20 Mg Tbec (Omeprazole) .Marland Kitchen.. 1 qam 30 min ac b'fast 4)  Adult Aspirin Low Strength 81 Mg Tbdp (Aspirin) .Marland Kitchen.. 1 by mouth once daily 5)  Claritin 10 Mg Tabs (Loratadine) .Marland Kitchen.. 1 by mouth once daily 6)  Multivitamins Tabs (Multiple vitamin) .Marland Kitchen.. 1 by mouth once daily 7)  Viagra 100 Mg Tabs (Sildenafil  citrate) .... Once daily prn 8)  Zovirax 5 % Crea (Acyclovir) .... Apply as directed 9)  Azithromycin 250 Mg Tabs (Azithromycin) .... As per pack  Patient Instructions: 1)  To ER if symptoms recur or progress. Stop aspirin . 2)  Drink as much fluid as you can tolerate for the next few days. Prescriptions: AZITHROMYCIN 250 MG  TABS (AZITHROMYCIN) as per pack  #1 x 0   Entered and Authorized by:   Marga Melnick MD   Signed by:   Marga Melnick MD on 07/28/2009   Method used:   Faxed to ...       [facility]  [address] [phone]* (retail)       [address]       [place], [place]  [address]       Ph: [phone]       Fax: [phone]   RxID:   [id number]

## 2010-04-26 NOTE — Progress Notes (Signed)
Summary: Lab Results  Phone Note Outgoing Call Call back at [facility] Phone [phone]   Call placed by: Shonna Chock,  Jul 29, 2009 7:54 AM Call placed to: Patient Summary of Call: Spoke with patient, Blood counts & clotting studies are normal. Hasn't he had his chest Xray yet?  Patient aware labs normal and said he didnt get the chance to have chest xray yesterday and will go get it done today.   Ricardo Sloan  Jul 29, 2009 7:54 AM     ..  Follow-up for Phone Call        noted Follow-up by: Marga Melnick MD,  Jul 29, 2009 1:17 PM

## 2010-04-26 NOTE — Miscellaneous (Signed)
Summary: Flu Vaccine / [facility]  Flu Vaccine / [facility]   Imported By: Lennie Odor 01/27/2010 16:13:42  _____________________________________________________________________  External Attachment:    Type:   Image     Comment:   External Document

## 2010-04-26 NOTE — Miscellaneous (Signed)
Summary: Orders Update  Clinical Lists Changes  Orders: Added new Referral order of Misc. Referral (Misc. Ref) - Signed 

## 2010-08-11 NOTE — Assessment & Plan Note (Signed)
[facility]                        [place] OFFICE NOTE   Ricardo Sloan                     MRN:          [id number]  DATE:07/16/2006                            DOB:          08-11-1931    Ricardo Sloan was seen July 16, 2006 for a comprehensive medical  evaluation.  Active symptoms included mild intermittent abdominal pain.  In February he had several weeks of abdominal discomfort extending from  the pelvic rim up to the ribs and to the flank.  It was not severe  enough to prompt an office visit and he felt it had similar symptoms  over the years which had resolved spontaneously.  The symptoms have  essentially resolved.  At this time he has no genitourinary or GI  symptoms.   He was evaluated in November 2007 for rash and fever associated with  elevated hepatic enzymes.  The rash and enzyme elevation resolved;  Hemoccult studies were negative at that time.  He did exhibit a  borderline anemia with a hematocrit 38.2.   PAST MEDICAL HISTORY:  1. Nasal surgery in the 60s.  2. Tonsillectomy.  3. Pneumonia as an outpatient in high school.  4. Two negative colonoscopies.  5. He did exhibit esophageal reflux findings on endoscopy in 2003.  6. He has had history of prostatic hypertrophy and dyslipidemia.   FAMILY HISTORY:  Includes diabetes in his mother in her 15s.  His  daughter unfortunately died of a subarachnoid hemorrhage.   He quit smoking in 1967; he drinks minimally.  He is physically active,  particularly seasonally.  He will walk 2 miles per day, 3-5 days per  week with no cardiopulmonary symptoms.  He will work in his yard up to 6  hours.   He denies any constitutional symptoms.   He also denies polyuria, polyphagia, polydipsia.  He will have nocturia  one time nightly.   There are no neurologic or musculoskeletal symptoms of significance.   1. Eighty one mg of aspirin daily.  2. Multivitamin.  3. Pravachol  40 mg one half daily.  4. He does take Tagamet if needed.  5. Claritin as needed.   HE HAS NO KNOWN DRUG ALLERGIES.   He is 5 foot 11, weighs 171; this is down approximately 5 pounds.  Pulse  was 72, respiratory rate 14, and blood pressure 120/80.  There are AV  crossing changes on fundal exam.  He wears his hearing aid on the right.  There is some dullness of the tympanic membranes.  Dental hygiene is  excellent.  The thyroid is normal to palpation;there is no  lymphadenopathy about the head, neck, or axilla.  He has a slow heart rhythm with no significant murmurs or gallops.  CHEST:  Clear.  He has no abdominal tenderness, masses.  EXTERNAL GENITALIA:  Normal.  Prostate is within normal limits; Hemoccult testing is negative.  He has mild crepitus in the knees but no significant joint symptoms.  NEUROPSYCHIATRIC:  Evaluation is negative.  SKIN:  Clear of any significant lesions.  EKG does reveal a sinus bradycardia.   In addition to  the routine labs, a B-12 level, folate, and iron studies  will be performed because of the mild anemia noted in November 2007.   Further recommendations will depend on the findings from this exam.  If  the abdominal pain or flank pain recurs then imaging will be pursued  with possible CT urogram.  Routine urinalysis will be collected.     Ricardo Sloan. Ricardo Ren, MD,FACP,FCCP  Electronically Signed    [facility]  DD: 07/16/2006  DT: 07/16/2006  Job #: [id number]

## 2010-08-11 NOTE — Assessment & Plan Note (Signed)
[facility]                        [place] OFFICE NOTE   Ricardo Sloan, Ricardo Sloan                     MRN:          [id number]  DATE:02/11/2006                            DOB:          10-Feb-1932    [name] was seen February 11, 2006 for evaluation of chills,  arthralgias, myalgias and fever.  This occurred while he was in [place]  with onset February 07, 2006 as achiness in his head and neck associated  with chills and fever that afternoon.  He did not check his temperature  when he had the chills, but felt hot.  He also had intermittent earache.  He denied any purulence from the respiratory tract, either upper or  lower.  He had no pulmonary symptoms.  He also developed a diffuse rash  on February 09, 2006, which was progressive.  It was non-pruritic, and  had begun to improve when seen February 11, 2006.  He denied any tick  exposure. He was taking Airborne and aspirin, up to 3 to 4 aspirin per  day.   Weight was actually up 8 pounds to 176.  He was afebrile at 98.6.  Pulse  72 and regular.  Respiratory rate was 15 and unlabored.  Blood pressure  was 130/64.  He had full extraocular motion.  He had no scleral icterus.  There was  no jaundice.  He wore a hearing aid on the right.  There were no  significant tympanic membrane changes.  He had no conjunctival lesions,  and no nailbed changes to suggest endocarditis.  Neck was supple.  No murmur was noted.  He had no organomegaly or lymphadenopathy.  Brudzinski's and Kernig's signs were negative.  There was a diffuse, faint, erythematous rash over his extremities which  blanched with pressure.   The findings suggest a recent infectious process associated with  vasculitis, which was improving.   CBC and differential revealed a white count of 4800.  His hematocrit was  37.6; in February it had been 41.5.  He had eosinophil count of 6.2;  this was unchanged from a value of 6.1 in  February.  Sed rate was mildly  elevated at 32.  Significantly, SGOT was 74 and SGPT 90.  These had been  normal in February.  Albumin was slightly low at 3.2.   These findings were discussed by phone on February 16, 2006.  He stated  that the rash had essentially resolved since his visit February 11, 2006.On February 12, 2006 he began to have some lumbosacral pain with  extension into the iliosacral joints.  He also had the pain on February 13, 2006.  He had no low back pain on February 16, 2006 or the 25th.  He  denied any Coca [employer]a-colored urine or white stool.  He also denied  melena.   Followup labs will be scheduled for February 18, 2006 to include hepatic  function, BUN, creatinine, potassium, CBC and differential, and  hepatitis panel as well as stool cards.     Titus Dubin. Alwyn Ren, MD,FACP,FCCP  Electronically Signed    [facility]  DD: 02/16/2006  DT: 02/16/2006  Job #: [id number]

## 2010-09-30 ENCOUNTER — Encounter: Payer: Self-pay | Admitting: Internal Medicine

## 2010-10-05 ENCOUNTER — Ambulatory Visit (INDEPENDENT_AMBULATORY_CARE_PROVIDER_SITE_OTHER): Payer: Medicare Other | Admitting: Internal Medicine

## 2010-10-05 ENCOUNTER — Encounter: Payer: Self-pay | Admitting: Internal Medicine

## 2010-10-05 VITALS — BP 130/80 | HR 64 | Temp 98.3°F | Resp 12 | Ht 70.5 in | Wt 161.8 lb

## 2010-10-05 DIAGNOSIS — M545 Low back pain, unspecified: Secondary | ICD-10-CM

## 2010-10-05 DIAGNOSIS — K219 Gastro-esophageal reflux disease without esophagitis: Secondary | ICD-10-CM

## 2010-10-05 DIAGNOSIS — Z0000 Encounter for general adult medical examination without abnormal findings: Secondary | ICD-10-CM

## 2010-10-05 DIAGNOSIS — E785 Hyperlipidemia, unspecified: Secondary | ICD-10-CM

## 2010-10-05 DIAGNOSIS — R1031 Right lower quadrant pain: Secondary | ICD-10-CM

## 2010-10-05 DIAGNOSIS — I10 Essential (primary) hypertension: Secondary | ICD-10-CM

## 2010-10-05 DIAGNOSIS — J984 Other disorders of lung: Secondary | ICD-10-CM

## 2010-10-05 LAB — CBC WITH DIFFERENTIAL/PLATELET
Basophils Absolute: 0 10*3/uL (ref 0.0–0.1)
Eosinophils Relative: 4.2 % (ref 0.0–5.0)
MCHC: 34.1 g/dL (ref 30.0–36.0)
MCV: 94.7 fl (ref 78.0–100.0)
Neutro Abs: 2.2 10*3/uL (ref 1.4–7.7)
Neutrophils Relative %: 44.7 % (ref 43.0–77.0)
Platelets: 203 10*3/uL (ref 150.0–400.0)
RBC: 4.21 Mil/uL — ABNORMAL LOW (ref 4.22–5.81)
RDW: 13.8 % (ref 11.5–14.6)
WBC: 4.9 10*3/uL (ref 4.5–10.5)

## 2010-10-05 LAB — LIPID PANEL
Cholesterol: 164 mg/dL (ref 0–200)
LDL Cholesterol: 107 mg/dL — ABNORMAL HIGH (ref 0–99)
VLDL: 7.4 mg/dL (ref 0.0–40.0)

## 2010-10-05 LAB — HEPATIC FUNCTION PANEL: Total Protein: 6.8 g/dL (ref 6.0–8.3)

## 2010-10-05 NOTE — Patient Instructions (Addendum)
Preventive Health Care: Exercise at least 30-45 minutes a day,  3-4 days a week.  Eat a low-fat diet with lots of fruits and vegetables, up to 7-9 servings per day. Consume less than 40 grams of sugar per day from foods & drinks with High Fructose Corn Sugar as # 1,2,3 or # 4 on label. Please complete stool cards.Screening Colonoscopy as per GI

## 2010-10-05 NOTE — Progress Notes (Signed)
Subjective:    Patient ID: Ricardo Sloan, male    DOB: [birth year], 77 y.o.   MRN: [id number]  HPI Medicare Wellness Visit:  The following psychosocial & medical history were reviewed as required by Medicare.   Social history: caffeine: 2 cups coffee/ day , alcohol:  2-3 drinks/ month ,  tobacco use : quit 1967  & exercise : gardening & calisthentics.   Home & personal  safety / fall risk: no issues, activities of daily living: no limitations , seatbelt use : yes , and smoke alarm employment : yes .  Power of Attorney/Living Will status : in place  Vision ( as recorded per Nurse) & Hearing  evaluation :  Wall chart read @ 6 ft;hearing aid on R, decreased hearing on L to whisper @ 6 ft(he declined aid on L). Orientation :oriented X3 , memory & recall :excellent, spelling or math testing: good,and mood & affect : normal . Depression / anxiety: denied Travel history : 2007 [place] , immunization status :up to date , transfusion history:  no, and preventive health surveillance ( colonoscopies, BMD , etc as per protocol/ [facility]): he has received recall for colonoscopy, Dental care:  Every 6 mos . Chart reviewed &  Updated. Active issues reviewed & addressed.       Review of Systems Patient reports no vision  changes,anorexia, weight change, fever ,adenopathy, persistant / recurrent hoarseness, swallowing issues, chest pain,palpitations, edema,persistant / recurrent cough, hemoptysis, dyspnea(rest, exertional, paroxysmal nocturnal), gastrointestinal  bleeding (melena, rectal bleeding),  excessive heart burn, GU symptoms( dysuria, hematuria, pyuria, voiding/incontinence  issues) syncope, focal weakness, memory loss,numbness & tingling, skin/hair/nail changes,depression, anxiety, or abnormal bruising/bleeding.   He describes intermittent burning discomfort in the right lower quadrant. This hs been a  problem for years but worse in the last several months. This is in the context of right lumbosacral/flank  discomfort. He's had chronic low back symptoms associated with some discomfort into the anterior  thighs. As noted he has no significant GI symptoms. He's been contacted for followup colonoscopy. The 2 prior colonoscopies in 2001 2006 were negative except for internal hemorrhoids.     Objective:   Physical Exam Gen.: Thin but healthy in appearance. Alert, appropriate and cooperative throughout exam. Head: Normocephalic without obvious abnormalities;  no alopecia  Eyes: No corneal or conjunctival inflammation noted. Pupils equal round reactive to light and accommodation. Fundal exam is benign without hemorrhages, exudate, papilledema. Extraocular motion intact.  Ears: External  ear exam reveals no significant lesions or deformities. Canals clear .TMs normal. Hearing as noted above Nose: External nasal exam reveals no deformity or inflammation. Nasal mucosa are pink and moist. No lesions or exudates noted.  Mouth: Oral mucosa and oropharynx reveal no lesions or exudates. Teeth in good repair. Neck: No deformities, masses, or tenderness noted. Range of motion & . Thyroid normal. Lungs: Normal respiratory effort; chest expands symmetrically. Lungs are clear to auscultation without rales, wheezes, or increased work of breathing. Heart: Normal rate and rhythm. Normal S1 and S2. No gallop, click, or rub. S4 w/o  murmur. Abdomen: Bowel sounds normal; abdomen soft and nontender. No masses, organomegaly or hernias noted. Genitalia/DRE: Mild prostatic hypertrophy is present; prostate is soft without nodules. There is no right adnexal tenderness or mass.  Marland Kitchen  Musculoskeletal/extremities: No deformity or scoliosis noted of  the thoracic or lumbar spine but asymmetry of thoracic muscles, R > L. No clubbing, cyanosis, edema, or deformity noted. Range of motion  normal .Neg SLR.Tone & strength  Normal.Joints:minor OA DIP changes R > L..  Nail health  good. Vascular: Carotid, radial artery, dorsalis pedis and  posterior tibial pulses are full and equal. No bruits present. Neurologic: Alert and oriented x3. Deep tendon reflexes symmetrical and normal.         Skin: Intact without suspicious lesions or rashes. Lymph: No cervical, axillary, or inguinal lymphadenopathy present. Psych: Mood and affect are normal. Normally interactive                                                                                         Assessment & Plan:  #1 Medicare Wellness Exam; criteria met ; data entered #2 Problem List reviewed ; Assessment/ Recommendations made  #3 right lower quadrant burning in the context of chronic low back syndrome; probable spinal stenosis with lumbar radiculopathy in the L2-L3 area. Plan: see Orders  . If the right lower quadrant and thigh symptoms progress, imaging would be pursued. Tramadol  has been of benefit and this will be continued.

## 2010-10-06 ENCOUNTER — Ambulatory Visit (INDEPENDENT_AMBULATORY_CARE_PROVIDER_SITE_OTHER)
Admission: RE | Admit: 2010-10-06 | Discharge: 2010-10-06 | Disposition: A | Discharge status: Home / Self Care | Payer: Medicare Other | Source: Ambulatory Visit | Attending: Internal Medicine | Admitting: Internal Medicine

## 2010-10-06 DIAGNOSIS — J984 Other disorders of lung: Secondary | ICD-10-CM

## 2010-10-24 ENCOUNTER — Other Ambulatory Visit: Payer: Self-pay | Admitting: Internal Medicine

## 2011-01-03 ENCOUNTER — Other Ambulatory Visit: Payer: Self-pay | Admitting: Internal Medicine

## 2011-02-02 ENCOUNTER — Other Ambulatory Visit: Payer: Self-pay | Admitting: Internal Medicine

## 2011-10-04 ENCOUNTER — Other Ambulatory Visit: Payer: Self-pay | Admitting: Internal Medicine

## 2011-10-04 NOTE — Telephone Encounter (Signed)
Refill done. Pt has future appt.  

## 2011-10-08 ENCOUNTER — Ambulatory Visit (INDEPENDENT_AMBULATORY_CARE_PROVIDER_SITE_OTHER)
Admission: RE | Admit: 2011-10-08 | Discharge: 2011-10-08 | Disposition: A | Discharge status: Home / Self Care | Payer: Medicare Other | Source: Ambulatory Visit | Attending: Internal Medicine | Admitting: Internal Medicine

## 2011-10-08 ENCOUNTER — Ambulatory Visit (INDEPENDENT_AMBULATORY_CARE_PROVIDER_SITE_OTHER): Payer: Medicare Other | Admitting: Internal Medicine

## 2011-10-08 ENCOUNTER — Encounter: Payer: Self-pay | Admitting: Internal Medicine

## 2011-10-08 VITALS — BP 128/84 | HR 62 | Temp 97.6°F | Resp 12 | Ht 70.5 in | Wt 161.8 lb

## 2011-10-08 DIAGNOSIS — R918 Other nonspecific abnormal finding of lung field: Secondary | ICD-10-CM

## 2011-10-08 DIAGNOSIS — Z0000 Encounter for general adult medical examination without abnormal findings: Secondary | ICD-10-CM

## 2011-10-08 DIAGNOSIS — K219 Gastro-esophageal reflux disease without esophagitis: Secondary | ICD-10-CM

## 2011-10-08 DIAGNOSIS — I10 Essential (primary) hypertension: Secondary | ICD-10-CM

## 2011-10-08 DIAGNOSIS — R1310 Dysphagia, unspecified: Secondary | ICD-10-CM

## 2011-10-08 DIAGNOSIS — R9389 Abnormal findings on diagnostic imaging of other specified body structures: Secondary | ICD-10-CM

## 2011-10-08 DIAGNOSIS — E785 Hyperlipidemia, unspecified: Secondary | ICD-10-CM

## 2011-10-08 LAB — LIPID PANEL
Cholesterol: 173 mg/dL (ref 0–200)
VLDL: 13.4 mg/dL (ref 0.0–40.0)

## 2011-10-08 LAB — CBC WITH DIFFERENTIAL/PLATELET
Basophils Absolute: 0.1 10*3/uL (ref 0.0–0.1)
Basophils Relative: 0.9 % (ref 0.0–3.0)
HCT: 43 % (ref 39.0–52.0)
Hemoglobin: 14.2 g/dL (ref 13.0–17.0)
Lymphs Abs: 2.3 10*3/uL (ref 0.7–4.0)
MCHC: 33 g/dL (ref 30.0–36.0)
Monocytes Relative: 8.9 % (ref 3.0–12.0)
Neutro Abs: 2.7 10*3/uL (ref 1.4–7.7)
RDW: 14 % (ref 11.5–14.6)

## 2011-10-08 LAB — BASIC METABOLIC PANEL
CO2: 29 mEq/L (ref 19–32)
GFR: 70.99 mL/min (ref >60.00)
Glucose, Bld: 99 mg/dL (ref 70–99)
Potassium: 4.6 mEq/L (ref 3.5–5.1)
Sodium: 142 mEq/L (ref 135–145)

## 2011-10-08 LAB — HEPATIC FUNCTION PANEL
AST: 21 U/L (ref 0–37)
Albumin: 4.3 g/dL (ref 3.5–5.2)
Total Protein: 7 g/dL (ref 6.0–8.3)

## 2011-10-08 NOTE — Assessment & Plan Note (Signed)
He describes dysphagia; Gastroenterology followup indicated

## 2011-10-08 NOTE — Assessment & Plan Note (Signed)
Blood pressure is well controlled; no change in medicines indicated.

## 2011-10-08 NOTE — Patient Instructions (Addendum)
Preventive Health Care: Exercise at least 30-45 minutes a day,  3-4 days a week.  Eat a low-fat diet with lots of fruits and vegetables, up to 7-9 servings per day.  Consume less than 40 grams of sugar per day from foods & drinks with High Fructose Corn Sugar as # 1,2,3 or # 4 on label. The triggers for reflux  include stress; the "aspirin family" ; alcohol; peppermint; and caffeine (coffee, tea, cola, and chocolate). The aspirin family would include aspirin and the nonsteroidal agents such as ibuprofen &  Naproxen. Tylenol would not cause reflux. If having symptoms ; food & drink should be avoided for @ least 2 hours before going to bed.  Please try to go on My Chart within the next 24 hours to allow me to release the results directly to you.

## 2011-10-08 NOTE — Assessment & Plan Note (Signed)
Fasting lipids, TSH, and liver functions will be checked.

## 2011-10-08 NOTE — Progress Notes (Signed)
Subjective:    Patient ID: MUAD NOGA, male    DOB: Jul 24, 1931, 78 y.o.   MRN: [id number]  HPI Medicare Wellness Visit:  The following psychosocial & medical history were reviewed as required by Medicare.   Social history: caffeine: 2 cups coffee/ day , alcohol:  rarely ,  tobacco use : quit 1967 & exercise : yard work, physically active.   Home & personal  safety / fall risk: no issues, activities of daily living: no limitations , seatbelt use : yes , and smoke alarm employment : yes .  Power of Attorney/Living Will status : in place  Vision ( as recorded per Nurse) & Hearing  evaluation :  Ophth exam < 6 mos ago; hearing aid checked & cleaned 2 mos ago. Orientation :oriented X 3 , memory & recall :good,  math testing: good,and mood & affect : normal. Depression / anxiety: denied Travel history : last 32  [place] , immunization status :up to date , transfusion history:  no, and preventive health surveillance ( colonoscopies, BMD , etc as per protocol/ [facility]): last colonoscopy 2006, Dental care:  Every 6 mos . Chart reviewed &  Updated. Active issues reviewed & addressed.       Review of Systems HYPERTENSION: Disease Monitoring: Blood pressure range-128-134/78-84  Chest pain, palpitations- rare fluttering       Dyspnea- no Medications: Compliance- yes  Lightheadedness,Syncope-no   Edema- no  HYPERLIPIDEMIA: Disease Monitoring: See symptoms for Hypertension Medications: Compliance- yes  Abd pain, bowel changes-no   Muscle aches- no  GERD: He describes dysphagia with dry foods such as cereal or bread. He's had no unexplained weight loss,abdominal pain , melena, rectal bleeding. He has had an endoscopy in the past; it did not reveal a stricture       Objective:   Physical Exam Gen.: Appears younger than stated age ;thin but  well-nourished in appearance. Alert, appropriate and cooperative throughout exam. Head: Normocephalic without obvious abnormalities;  no alopecia    Eyes: No corneal or conjunctival inflammation noted.  Extraocular motion intact. Vision grossly normal with lenses. Ears: External  ear exam reveals no significant lesions or deformities.  Hearing aid on RNose: External nasal exam reveals no deformity or inflammation. Nasal mucosa are pink and  moist. No lesions or exudates noted.  Mouth: Oral mucosa and oropharynx reveal no lesions or exudates. Teeth in good repair. Neck: No deformities, masses, or tenderness noted. Range of motion & Thyroid normal Lungs: Normal respiratory effort; chest expands symmetrically. Lungs are clear to auscultation without rales, wheezes, or increased work of breathing. Heart: Slow rate and regular rhythm. Normal S1 and S2. No gallop, click, or rub. S4 w/o  murmur. Abdomen: Bowel sounds normal; abdomen soft and nontender. No masses, organomegaly or hernias noted. Genitalia/ DRE: Genitalia normal except for small left varices . There is mild asymmetry of the prostate; right lobe minimally larger than the left. No nodularity present. Musculoskeletal/extremities: No deformity or scoliosis noted of  the thoracic or lumbar spine;but there is minimmal asymmetry of the posterior thoracic musculature suggesting possible mild  occult scoliosis. . No clubbing, cyanosis, edema, or deformity noted. Range of motion  normal .Tone & strength  normal.Joints normal. Nail health  good. Vascular: Carotid, radial artery, dorsalis pedis and  posterior tibial pulses are full and equal. No bruits present. Neurologic: Alert and oriented x3. Deep tendon reflexes symmetrical and normal.          Skin: Intact without suspicious lesions or rashes.Keratosis L  posterior thorax Lymph: No cervical, axillary, or inguinal lymphadenopathy present. Psych: Mood and affect are normal. Normally interactive                                                                                         Assessment & Plan:  #1 Medicare Wellness Exam; criteria met  ; data entered #2 Problem List reviewed ; Assessment/ Recommendations made Plan: see Orders

## 2011-10-08 NOTE — Addendum Note (Signed)
Addended by: Maurice Small on: 10/08/2011 10:59 AM   Modules accepted: Orders

## 2011-10-09 ENCOUNTER — Encounter: Payer: Self-pay | Admitting: Internal Medicine

## 2011-10-14 ENCOUNTER — Other Ambulatory Visit: Payer: Self-pay | Admitting: Internal Medicine

## 2011-10-20 ENCOUNTER — Other Ambulatory Visit: Payer: Self-pay | Admitting: Internal Medicine

## 2011-11-08 ENCOUNTER — Encounter: Payer: Self-pay | Admitting: Internal Medicine

## 2011-11-08 ENCOUNTER — Ambulatory Visit (INDEPENDENT_AMBULATORY_CARE_PROVIDER_SITE_OTHER): Payer: Medicare Other | Admitting: Internal Medicine

## 2011-11-08 VITALS — BP 128/64 | HR 62 | Ht 70.5 in | Wt 164.0 lb

## 2011-11-08 DIAGNOSIS — Z860100 Personal history of colon polyps, unspecified: Secondary | ICD-10-CM

## 2011-11-08 DIAGNOSIS — K219 Gastro-esophageal reflux disease without esophagitis: Secondary | ICD-10-CM

## 2011-11-08 DIAGNOSIS — R1310 Dysphagia, unspecified: Secondary | ICD-10-CM

## 2011-11-08 MED ORDER — OMEPRAZOLE 40 MG PO CPDR: 40.0000 mg | DELAYED_RELEASE_CAPSULE | Freq: Every day | ORAL | Status: DC

## 2011-11-08 NOTE — Patient Instructions (Addendum)
You have been scheduled for a Barium Esophogram at [facility] (1st floor of the hospital) on 11-14-11 at 9:30. Please arrive 15 minutes prior to your appointment for registration.  If you need to reschedule for any reason, please contact radiology at [phone] to do so.

## 2011-11-11 ENCOUNTER — Encounter: Payer: Self-pay | Admitting: Internal Medicine

## 2011-11-11 NOTE — Progress Notes (Signed)
HISTORY OF PRESENT ILLNESS:  Ricardo Sloan is a 78 y.o. male with hypertension, hyperlipidemia, and GERD. He presents today regarding dysphagia. He recently saw Dr. Alwyn Ren for a wellness visit in mid July. I have reviewed encounter. He complained of dysphagia. Referral made. Review of laboratories shows normal CBC and comprehensive metabolic panel. Patient was evaluated in July of 2010 regarding a similar complaint of intermittent solid food dysphagia that seems to hang up in the right side of his neck. Previous GI care with Dr. Corinda Gubler. Upper endoscopy in 2003 was unremarkable. Repeat upper endoscopy 10/28/2008 was normal except for a sliding hiatal hernia. Due to the nature of his complaint, a barium esophagram was recommended. He decided not to follow through with this recommendation. He states that his problems seem to resolve until the past several months. I reviewed his our office note with him. It seems a complaints are the same as that time. He does take omeprazole 20 mg daily for GERD. Does report some breakthrough symptoms as well as some pharyngeal symptoms such as throat clearing.Marland Kitchen He also had a remote history of a diminutive colon polyp which was destroyed (no pathology) in 2001. Negative exam in 2006. He did receive a recall letter in 2011, but did not follow through. No documented history of adenomatous polyps. Review of systems is otherwise entirely negative. His chronic medical problems are reported to be stable.  REVIEW OF SYSTEMS:  All non-GI ROS negative except for back pain and hearing problems (hearing aid)  Past Medical History  Diagnosis Date  . Hyperlipidemia   . Hypertension   . BPH (benign prostatic hypertrophy)   . GERD (gastroesophageal reflux disease)     Upper endoscopy 2003, Dr. Corinda Gubler  . Hemorrhoids   . Microscopic hematuria 2001    Dr.  Vic Blackbird  . Pneumonia      X 2 in high school  . Hiatal hernia 2010  . Internal hemorrhoids     Past Surgical  History  Procedure Date  . Tonsillectomy        . Colonoscopy [id number]    Internal hemorrhoids, Dr. Corinda Gubler  . Nose surgery     Submucosal resection in the 1960s  . Upper gastrointestinal endoscopy 2003    GERD  . Wisdom tooth extraction     Social History SHYNE LEHRKE  reports that he quit smoking about 46 years ago. He has never used smokeless tobacco. He reports that he drinks alcohol. He reports that he does not use illicit drugs.  family history includes Diabetes in his mother; Lung cancer in his brother; and Subarachnoid hemorrhage in his daughter.  There is no history of Heart disease, and Stroke, and Hypertension, and Colon cancer, .  No Known Allergies     PHYSICAL EXAMINATION:  Vital signs: BP 128/64  Pulse 62  Ht 5' 10.5" (1.791 m)  Wt 164 lb (74.39 kg)  BMI 23.20 kg/m2 General: Well-developed, well-nourished, no acute distress HEENT: Sclerae are anicteric, conjunctiva pink. Oral mucosa intact. Normal posterior pharynx.. Normal neck with no adenopathy. Lungs: Clear Heart: Regular Abdomen: soft, nontender, nondistended, no obvious ascites, no peritoneal signs, normal bowel sounds. No organomegaly. Extremities: No edema Psychiatric: alert and oriented x3. Cooperative     ASSESSMENT:  #1. Vague dysphagia as described. Rule out Zenker's diverticulum. Rule out hypertensive cricopharyngeus. Prior negative endoscopies as outlined #2. GERD. Some breakthrough symptoms as well as pharyngeal symptoms despite omeprazole 20 mg daily. Possibly could explain some of his swallowing complaints, or  certainly pharyngeal symptoms such as throat clearing #3. Colon cancer screening. Up-to-date. Last exam 2006 negative. Asymptomatic with normal laboratories   PLAN:  #1. Barium swallow with tablet to further evaluate swallowing complaints #2. Increase omeprazole from 20 mg daily to 40 mg daily to address occasional breakthrough symptoms as well as pharyngeal complaints.  Prescribed #3. The plans for further routine screening colonoscopy given his age and the results of his exam in 2006 #4. Ongoing general medical care with Dr. Alwyn Ren

## 2011-11-14 ENCOUNTER — Telehealth (HOSPITAL_COMMUNITY): Payer: Self-pay | Admitting: Radiology

## 2011-11-14 ENCOUNTER — Ambulatory Visit (HOSPITAL_COMMUNITY)
Admission: RE | Admit: 2011-11-14 | Discharge: 2011-11-14 | Disposition: A | Discharge status: Home / Self Care | Payer: Medicare Other | Source: Ambulatory Visit | Attending: Internal Medicine | Admitting: Internal Medicine

## 2011-11-14 DIAGNOSIS — K219 Gastro-esophageal reflux disease without esophagitis: Secondary | ICD-10-CM

## 2011-11-14 DIAGNOSIS — R1310 Dysphagia, unspecified: Secondary | ICD-10-CM | POA: Insufficient documentation

## 2011-12-18 ENCOUNTER — Encounter: Payer: Self-pay | Admitting: Internal Medicine

## 2011-12-21 ENCOUNTER — Ambulatory Visit (INDEPENDENT_AMBULATORY_CARE_PROVIDER_SITE_OTHER): Payer: Medicare Other

## 2011-12-21 DIAGNOSIS — Z23 Encounter for immunization: Secondary | ICD-10-CM

## 2012-04-16 ENCOUNTER — Telehealth: Payer: Self-pay | Admitting: Internal Medicine

## 2012-04-16 MED ORDER — AMLODIPINE BESYLATE 5 MG PO TABS: ORAL_TABLET | ORAL | Status: DC

## 2012-04-16 NOTE — Telephone Encounter (Signed)
RX sent

## 2012-04-16 NOTE — Telephone Encounter (Signed)
Refill: Amlodipine besylate 5 mg tab. Take 1 tablet by mouth once daily. #90. *Request sent from [facility]*

## 2012-07-23 ENCOUNTER — Telehealth: Payer: Self-pay | Admitting: Internal Medicine

## 2012-07-23 MED ORDER — PRAVASTATIN SODIUM 40 MG PO TABS: ORAL_TABLET | ORAL | Status: DC

## 2012-07-23 NOTE — Telephone Encounter (Signed)
Would like refill to go to [facility] [address] [facility] for Pravastatine 40 mg tablets NOT [facility]

## 2012-07-23 NOTE — Telephone Encounter (Signed)
RX sent

## 2012-08-31 ENCOUNTER — Other Ambulatory Visit: Payer: Self-pay | Admitting: Internal Medicine

## 2012-10-12 ENCOUNTER — Other Ambulatory Visit: Payer: Self-pay | Admitting: Internal Medicine

## 2012-10-15 ENCOUNTER — Ambulatory Visit (INDEPENDENT_AMBULATORY_CARE_PROVIDER_SITE_OTHER)
Admission: RE | Admit: 2012-10-15 | Discharge: 2012-10-15 | Disposition: A | Discharge status: Home / Self Care | Payer: Medicare Other | Source: Ambulatory Visit | Attending: Internal Medicine | Admitting: Internal Medicine

## 2012-10-15 ENCOUNTER — Other Ambulatory Visit: Payer: Medicare Other

## 2012-10-15 ENCOUNTER — Ambulatory Visit (INDEPENDENT_AMBULATORY_CARE_PROVIDER_SITE_OTHER): Payer: Medicare Other | Admitting: Internal Medicine

## 2012-10-15 ENCOUNTER — Encounter: Payer: Self-pay | Admitting: Internal Medicine

## 2012-10-15 VITALS — BP 126/72 | HR 61 | Temp 97.8°F | Resp 12 | Ht 70.5 in | Wt 159.0 lb

## 2012-10-15 DIAGNOSIS — R9389 Abnormal findings on diagnostic imaging of other specified body structures: Secondary | ICD-10-CM

## 2012-10-15 DIAGNOSIS — E785 Hyperlipidemia, unspecified: Secondary | ICD-10-CM

## 2012-10-15 DIAGNOSIS — R918 Other nonspecific abnormal finding of lung field: Secondary | ICD-10-CM

## 2012-10-15 DIAGNOSIS — M25819 Other specified joint disorders, unspecified shoulder: Secondary | ICD-10-CM

## 2012-10-15 DIAGNOSIS — Z0000 Encounter for general adult medical examination without abnormal findings: Secondary | ICD-10-CM

## 2012-10-15 DIAGNOSIS — I10 Essential (primary) hypertension: Secondary | ICD-10-CM

## 2012-10-15 DIAGNOSIS — K219 Gastro-esophageal reflux disease without esophagitis: Secondary | ICD-10-CM

## 2012-10-15 DIAGNOSIS — M7542 Impingement syndrome of left shoulder: Secondary | ICD-10-CM

## 2012-10-15 LAB — BASIC METABOLIC PANEL
BUN: 16 mg/dL (ref 6–23)
CO2: 27 mEq/L (ref 19–32)
Calcium: 9.4 mg/dL (ref 8.4–10.5)
Creatinine, Ser: 1.1 mg/dL (ref 0.4–1.5)
Glucose, Bld: 92 mg/dL (ref 70–99)

## 2012-10-15 LAB — CBC WITH DIFFERENTIAL/PLATELET
Basophils Absolute: 0 10*3/uL (ref 0.0–0.1)
Eosinophils Absolute: 0.2 10*3/uL (ref 0.0–0.7)
Lymphocytes Relative: 39.7 % (ref 12.0–46.0)
MCHC: 33.5 g/dL (ref 30.0–36.0)
MCV: 95 fl (ref 78.0–100.0)
Monocytes Absolute: 0.5 10*3/uL (ref 0.1–1.0)
Neutrophils Relative %: 47.3 % (ref 43.0–77.0)
RDW: 14 % (ref 11.5–14.6)

## 2012-10-15 LAB — HEPATIC FUNCTION PANEL
Albumin: 4.2 g/dL (ref 3.5–5.2)
Alkaline Phosphatase: 59 U/L (ref 39–117)
Bilirubin, Direct: 0.1 mg/dL (ref 0.0–0.3)

## 2012-10-15 LAB — LIPID PANEL
HDL: 48.9 mg/dL (ref >39.00)
Total CHOL/HDL Ratio: 3
Triglycerides: 57 mg/dL (ref 0.0–149.0)
VLDL: 11.4 mg/dL (ref 0.0–40.0)

## 2012-10-15 LAB — TSH: TSH: 1.84 u[IU]/mL (ref 0.35–5.50)

## 2012-10-15 MED ORDER — PRAVASTATIN SODIUM 40 MG PO TABS: ORAL_TABLET | ORAL | Status: DC

## 2012-10-15 MED ORDER — AMLODIPINE BESYLATE 5 MG PO TABS: ORAL_TABLET | ORAL | Status: DC

## 2012-10-15 MED ORDER — OMEPRAZOLE 40 MG PO CPDR: DELAYED_RELEASE_CAPSULE | ORAL | Status: DC

## 2012-10-15 NOTE — Patient Instructions (Addendum)
Order for x-rays entered into  the computer; these will be performed at [address] across from [facility]. No appointment is necessary. 

## 2012-10-15 NOTE — Progress Notes (Signed)
Subjective:    Patient ID: Ricardo Sloan, male    DOB: 1931/08/23, 79 y.o.   MRN: [id number]  HPI  Medicare Wellness Visit:  Psychosocial & medical history were reviewed as required by Medicare (abuse,antisocial behavioral risks,firearm risk).  Social history: caffeine:2 cups/ day  , alcohol: rarely   ,  tobacco use:  Quit 1968  Exercise :  See below Home & personal  safety / fall risk: no Limitations of activities of daily living: no Seatbelt  and smoke alarm use: yes  Power of Attorney/Living Will status : in place Ophthalmology exam status : current Hearing evaluation status: current Orientation :oriented X 3 Memory & recall : good Math testing: good Active depression / anxiety: denied Foreign travel history : last 49 [place] Immunization status for Shingles /Flu/ PNA/ tetanus : current Transfusion history:  no Preventive health surveillance status of colonoscopy as per protocol/ SOC: current Dental care:   Every 6 mos Chart reviewed &  Updated. Active issues reviewed & addressed.      Review of Systems He is on a heart healthy diet; he exercises in yard & house work daily without symptoms. Specifically he denies chest pain, palpitations, dyspnea, or claudication. Family history is negative for premature coronary disease. Compliance with statin w/o GI , memory or myalgias except @ L shoulder & trapezius.  ? Injured lifting mower in April.    Objective:   Physical Exam Gen.: Thin but healthy and well-nourished in appearance. Alert, appropriate and cooperative throughout exam.Appears younger than stated age  Head: Normocephalic without obvious abnormalities; no alopecia  Eyes: No corneal or conjunctival inflammation noted.  Extraocular motion intact. Vision grossly normal with lenses Ears: External  ear exam reveals no significant lesions or deformities. Hearing aid on R. Nose: External nasal exam reveals no deformity or inflammation. Nasal mucosa are pink and moist. No  lesions or exudates noted.   Mouth: Oral mucosa and oropharynx reveal no lesions or exudates. Teeth in good repair. Neck: No deformities, masses, or tenderness noted. Range of motion & Thyroid normal. Lungs: Normal respiratory effort; chest expands symmetrically. Lungs are clear to auscultation without rales, wheezes, or increased work of breathing. Heart: Normal rate and rhythm. Normal S1 and S2. No gallop, click, or rub.  S 4 w/o murmur. Abdomen: Bowel sounds normal; abdomen soft and nontender. No masses, organomegaly or hernias noted. Genitalia: deferred due to age                             Musculoskeletal/extremities: No deformity or scoliosis noted of  the thoracic or lumbar spine.  No clubbing, cyanosis, edema, or significant extremity  deformity noted. Pain with range of motion L shoulder .Tone & strength  Normal. Joints  reveal mild  DJD DIP changes. Nail health good. Able to lie down & sit up w/o help. Negative SLR bilaterally Vascular: Carotid, radial artery, dorsalis pedis and  posterior tibial pulses are full and equal. No bruits present. Neurologic: Alert and oriented x3. Deep tendon reflexes symmetrical and normal.       Skin: Intact without suspicious lesions or rashes. Lymph: No cervical, axillary lymphadenopathy present. Psych: Mood and affect are normal. Normally interactive  Assessment & Plan:  #1 Medicare Wellness Exam; criteria met ; data entered #2 Problem List/Diagnoses reviewed Plan:  Assessments made/ Orders entered  

## 2012-12-19 ENCOUNTER — Ambulatory Visit (INDEPENDENT_AMBULATORY_CARE_PROVIDER_SITE_OTHER): Payer: Medicare Other | Admitting: Radiology

## 2012-12-19 DIAGNOSIS — Z23 Encounter for immunization: Secondary | ICD-10-CM

## 2013-04-09 ENCOUNTER — Other Ambulatory Visit: Payer: Self-pay | Admitting: Internal Medicine

## 2013-04-09 ENCOUNTER — Telehealth: Payer: Self-pay | Admitting: *Deleted

## 2013-04-09 ENCOUNTER — Other Ambulatory Visit: Payer: Self-pay | Admitting: *Deleted

## 2013-04-09 DIAGNOSIS — R911 Solitary pulmonary nodule: Secondary | ICD-10-CM

## 2013-04-09 NOTE — Telephone Encounter (Signed)
Dr. Linna Darner decided to set patient up for CT scan. Patient contacted and informed via phone. JG//CMA

## 2013-04-09 NOTE — Telephone Encounter (Signed)
Dr. Linna Darner verbalized that a chest x-ray needs to be repeated first. JG//CMA

## 2013-04-09 NOTE — Telephone Encounter (Signed)
Ok to make referral

## 2013-04-09 NOTE — Telephone Encounter (Signed)
Patient called and stated that back in July he had chest x-ray done and they found a nodule on his right upper lobe. He stated that dr hopper wanted him to have a scan done and he would like to get that set up. Also a refill request on his Viagra medication.   Pharmacy [id number] - [place], [place]

## 2013-04-14 ENCOUNTER — Ambulatory Visit
Admission: RE | Admit: 2013-04-14 | Discharge: 2013-04-14 | Disposition: A | Discharge status: Home / Self Care | Payer: Medicare HMO | Source: Ambulatory Visit | Attending: Internal Medicine | Admitting: Internal Medicine

## 2013-04-14 DIAGNOSIS — R911 Solitary pulmonary nodule: Secondary | ICD-10-CM

## 2013-04-20 NOTE — Telephone Encounter (Signed)
Patient called requesting refill on generic viagra. He originally requested this on 04/09/13. [facility]

## 2013-04-21 ENCOUNTER — Other Ambulatory Visit: Payer: Self-pay | Admitting: *Deleted

## 2013-04-21 MED ORDER — SILDENAFIL CITRATE 100 MG PO TABS: 100.0000 mg | ORAL_TABLET | Freq: Every day | ORAL | Status: DC | PRN

## 2013-04-21 NOTE — Telephone Encounter (Signed)
Medication e-scribed to pharmacy. JG//CMA 

## 2013-10-19 ENCOUNTER — Other Ambulatory Visit (INDEPENDENT_AMBULATORY_CARE_PROVIDER_SITE_OTHER): Payer: Medicare HMO

## 2013-10-19 ENCOUNTER — Other Ambulatory Visit: Payer: Self-pay | Admitting: Internal Medicine

## 2013-10-19 ENCOUNTER — Ambulatory Visit (INDEPENDENT_AMBULATORY_CARE_PROVIDER_SITE_OTHER): Payer: Medicare HMO | Admitting: Internal Medicine

## 2013-10-19 ENCOUNTER — Encounter: Payer: Self-pay | Admitting: Internal Medicine

## 2013-10-19 VITALS — BP 146/86 | HR 60 | Temp 97.8°F | Ht 71.0 in | Wt 164.4 lb

## 2013-10-19 DIAGNOSIS — K2100 Gastro-esophageal reflux disease with esophagitis, without bleeding: Secondary | ICD-10-CM

## 2013-10-19 DIAGNOSIS — R9389 Abnormal findings on diagnostic imaging of other specified body structures: Secondary | ICD-10-CM

## 2013-10-19 DIAGNOSIS — I10 Essential (primary) hypertension: Secondary | ICD-10-CM

## 2013-10-19 DIAGNOSIS — E785 Hyperlipidemia, unspecified: Secondary | ICD-10-CM

## 2013-10-19 LAB — BASIC METABOLIC PANEL
BUN: 18 mg/dL (ref 6–23)
CALCIUM: 9.4 mg/dL (ref 8.4–10.5)
CO2: 28 mEq/L (ref 19–32)
CREATININE: 1.1 mg/dL (ref 0.4–1.5)
Chloride: 106 mEq/L (ref 96–112)
GFR: 70.62 mL/min (ref >60.00)
GLUCOSE: 96 mg/dL (ref 70–99)
Potassium: 4.2 mEq/L (ref 3.5–5.1)
Sodium: 141 mEq/L (ref 135–145)

## 2013-10-19 LAB — HEPATIC FUNCTION PANEL
ALT: 21 U/L (ref 0–53)
AST: 20 U/L (ref 0–37)
Albumin: 4.1 g/dL (ref 3.5–5.2)
Alkaline Phosphatase: 62 U/L (ref 39–117)
BILIRUBIN DIRECT: 0.1 mg/dL (ref 0.0–0.3)
BILIRUBIN TOTAL: 1 mg/dL (ref 0.2–1.2)
Total Protein: 6.7 g/dL (ref 6.0–8.3)

## 2013-10-19 LAB — CBC WITH DIFFERENTIAL/PLATELET
BASOS ABS: 0 10*3/uL (ref 0.0–0.1)
Basophils Relative: 0.4 % (ref 0.0–3.0)
Eosinophils Absolute: 0.3 10*3/uL (ref 0.0–0.7)
Eosinophils Relative: 4.5 % (ref 0.0–5.0)
HEMATOCRIT: 40.1 % (ref 39.0–52.0)
Hemoglobin: 13.5 g/dL (ref 13.0–17.0)
LYMPHS ABS: 1.9 10*3/uL (ref 0.7–4.0)
LYMPHS PCT: 33.4 % (ref 12.0–46.0)
MCHC: 33.7 g/dL (ref 30.0–36.0)
MCV: 92.7 fl (ref 78.0–100.0)
MONOS PCT: 9.5 % (ref 3.0–12.0)
Monocytes Absolute: 0.5 10*3/uL (ref 0.1–1.0)
NEUTROS PCT: 52.2 % (ref 43.0–77.0)
Neutro Abs: 2.9 10*3/uL (ref 1.4–7.7)
PLATELETS: 236 10*3/uL (ref 150.0–400.0)
RBC: 4.32 Mil/uL (ref 4.22–5.81)
RDW: 13.9 % (ref 11.5–15.5)
WBC: 5.5 10*3/uL (ref 4.0–10.5)

## 2013-10-19 LAB — TSH: TSH: 2.19 u[IU]/mL (ref 0.35–4.50)

## 2013-10-19 LAB — CK: CK TOTAL: 94 U/L (ref 7–232)

## 2013-10-19 MED ORDER — SILDENAFIL CITRATE 20 MG PO TABS: ORAL_TABLET | ORAL | Status: DC

## 2013-10-19 NOTE — Progress Notes (Signed)
   Subjective:    Patient ID: Ricardo Sloan, male    DOB: [birth year], 80 y.o.   MRN: [id number]  HPI  He is here to assess active health issues & conditions. PMH, FH, & Social history verified & updated   A heart healthy diet is followed; exercise encompasses  15-30 minutes 5  times per week as calisthentics without symptoms.    Family history is neg for premature coronary disease. Advanced cholesterol testing indicated due to age.There is medication compliance with the statin.  Low dose ASA taken. BP averages 130/78-80 @ home.  Review of Systems  Specifically denied are  chest pain, palpitations, dyspnea, or claudication.  Significant abdominal symptoms or  memory deficit. Intermittent myalgias noted in upper buttocks. Unexplained weight loss, abdominal pain, significant dyspepsia, dysphagia, melena, rectal bleeding, or persistently small caliber stools are denied.      Objective:   Physical Exam Gen.: Healthy and well-nourished in appearance. Alert, appropriate and cooperative throughout exam. Appears younger than stated age  Head: Normocephalic without obvious abnormalities; no alopecia  Eyes: No corneal or conjunctival inflammation noted. Pupils equal round reactive to light and accommodation. Extraocular motion intact.  Ears: External  ear exam reveals no significant lesions or deformities. Hearing aid on R only. Nose: External nasal exam reveals no deformity or inflammation. Nasal mucosa are pink and moist. No lesions or exudates noted.   Mouth: Oral mucosa and oropharynx reveal no lesions or exudates. Teeth in good repair. Neck: No deformities, masses, or tenderness noted. Range of motion & Thyroid normal. Lungs: Normal respiratory effort; chest expands symmetrically. Lungs are clear to auscultation without rales, wheezes, or increased work of breathing. Heart: Normal rate and rhythm. Normal S1 and S2. No gallop, click, or rub. No murmur. Abdomen: Bowel sounds normal; abdomen  soft and nontender. No masses, organomegaly or hernias noted. Genitalia: deferred due to age                           Musculoskeletal/extremities: No deformity or scoliosis noted of  the thoracic or lumbar spine.  No clubbing, cyanosis, edema, or significant extremity  deformity noted. Range of motion normal .Tone & strength normal. Hand joints normal  Fingernail health good. Able to lie down & sit up w/o help. Negative SLR bilaterally Vascular: Carotid, radial artery, dorsalis pedis and  posterior tibial pulses are full and equal. No bruits present. Neurologic: Alert and oriented x3. Deep tendon reflexes symmetrical and normal.  Gait normal .      Skin: Intact without suspicious lesions or rashes. Lymph: No cervical, axillary lymphadenopathy present. Psych: Mood and affect are normal. Normally interactive                                                                                        Assessment & Plan:  See Current Assessment & Plan in Problem List under specific Diagnosis

## 2013-10-19 NOTE — Progress Notes (Signed)
Pre visit review using our clinic review tool, if applicable. No additional management support is needed unless otherwise documented below in the visit note. 

## 2013-10-19 NOTE — Assessment & Plan Note (Deleted)
CXray 

## 2013-10-19 NOTE — Assessment & Plan Note (Signed)
NMR Lipoprofile, LFTs, TSH ,CK 

## 2013-10-19 NOTE — Patient Instructions (Signed)

## 2013-10-19 NOTE — Assessment & Plan Note (Signed)
Blood pressure goals reviewed. BMET 

## 2013-10-20 LAB — NMR LIPOPROFILE WITH LIPIDS
Cholesterol, Total: 158 mg/dL (ref <200)
HDL PARTICLE NUMBER: 30.2 umol/L — AB (ref >=30.5)
HDL SIZE: 8.8 nm — AB (ref >=9.2)
HDL-C: 49 mg/dL (ref >=40)
LDL CALC: 95 mg/dL (ref <100)
LDL Particle Number: 1168 nmol/L — ABNORMAL HIGH (ref <1000)
LDL SIZE: 20.7 nm (ref >20.5)
LP-IR SCORE: 35 (ref <=45)
Large HDL-P: 4.9 umol/L (ref >=4.8)
Large VLDL-P: <0.8 nmol/L (ref <=2.7)
Small LDL Particle Number: 368 nmol/L (ref <=527)
TRIGLYCERIDES: 68 mg/dL (ref <150)
VLDL Size: 42.8 nm (ref <=46.6)

## 2013-10-24 ENCOUNTER — Encounter: Payer: Self-pay | Admitting: Internal Medicine

## 2013-11-09 ENCOUNTER — Other Ambulatory Visit: Payer: Self-pay

## 2013-11-09 MED ORDER — OMEPRAZOLE 40 MG PO CPDR: DELAYED_RELEASE_CAPSULE | ORAL | Status: DC

## 2013-12-11 ENCOUNTER — Encounter: Payer: Self-pay | Admitting: Gastroenterology

## 2013-12-11 ENCOUNTER — Encounter: Payer: Self-pay | Admitting: Internal Medicine

## 2013-12-28 ENCOUNTER — Ambulatory Visit (INDEPENDENT_AMBULATORY_CARE_PROVIDER_SITE_OTHER): Payer: Medicare HMO | Admitting: *Deleted

## 2013-12-28 DIAGNOSIS — Z23 Encounter for immunization: Secondary | ICD-10-CM

## 2014-01-04 ENCOUNTER — Other Ambulatory Visit: Payer: Self-pay

## 2014-01-04 DIAGNOSIS — I10 Essential (primary) hypertension: Secondary | ICD-10-CM

## 2014-01-04 DIAGNOSIS — E785 Hyperlipidemia, unspecified: Secondary | ICD-10-CM

## 2014-01-04 MED ORDER — PRAVASTATIN SODIUM 40 MG PO TABS: ORAL_TABLET | ORAL | Status: DC

## 2014-01-04 MED ORDER — AMLODIPINE BESYLATE 5 MG PO TABS: ORAL_TABLET | ORAL | Status: DC

## 2014-03-30 ENCOUNTER — Telehealth: Payer: Self-pay | Admitting: Internal Medicine

## 2014-03-30 NOTE — Telephone Encounter (Signed)
Patient is requesting pharmacy on file to be changed to [facility] in [place].

## 2014-03-30 NOTE — Telephone Encounter (Signed)
Done

## 2014-04-01 ENCOUNTER — Other Ambulatory Visit: Payer: Self-pay | Admitting: Internal Medicine

## 2014-04-13 ENCOUNTER — Other Ambulatory Visit: Payer: Self-pay | Admitting: Internal Medicine

## 2014-06-14 ENCOUNTER — Ambulatory Visit (INDEPENDENT_AMBULATORY_CARE_PROVIDER_SITE_OTHER): Payer: Medicare HMO | Admitting: Internal Medicine

## 2014-06-14 ENCOUNTER — Encounter: Payer: Self-pay | Admitting: Internal Medicine

## 2014-06-14 ENCOUNTER — Other Ambulatory Visit (INDEPENDENT_AMBULATORY_CARE_PROVIDER_SITE_OTHER): Payer: Medicare HMO

## 2014-06-14 VITALS — BP 140/88 | HR 67 | Temp 97.7°F | Ht 71.0 in | Wt 163.2 lb

## 2014-06-14 DIAGNOSIS — R5383 Other fatigue: Secondary | ICD-10-CM

## 2014-06-14 DIAGNOSIS — I499 Cardiac arrhythmia, unspecified: Secondary | ICD-10-CM

## 2014-06-14 LAB — CBC WITH DIFFERENTIAL/PLATELET
BASOS ABS: 0 10*3/uL (ref 0.0–0.1)
Basophils Relative: 0.5 % (ref 0.0–3.0)
Eosinophils Absolute: 0.1 10*3/uL (ref 0.0–0.7)
Eosinophils Relative: 2.2 % (ref 0.0–5.0)
HEMATOCRIT: 42.3 % (ref 39.0–52.0)
Hemoglobin: 14.3 g/dL (ref 13.0–17.0)
Lymphocytes Relative: 31.1 % (ref 12.0–46.0)
Lymphs Abs: 1.9 10*3/uL (ref 0.7–4.0)
MCHC: 33.7 g/dL (ref 30.0–36.0)
MCV: 90.6 fl (ref 78.0–100.0)
Monocytes Absolute: 0.5 10*3/uL (ref 0.1–1.0)
Monocytes Relative: 8.7 % (ref 3.0–12.0)
NEUTROS ABS: 3.5 10*3/uL (ref 1.4–7.7)
Neutrophils Relative %: 57.5 % (ref 43.0–77.0)
PLATELETS: 247 10*3/uL (ref 150.0–400.0)
RBC: 4.66 Mil/uL (ref 4.22–5.81)
RDW: 14.4 % (ref 11.5–15.5)
WBC: 6 10*3/uL (ref 4.0–10.5)

## 2014-06-14 LAB — BASIC METABOLIC PANEL
BUN: 23 mg/dL (ref 6–23)
CHLORIDE: 106 meq/L (ref 96–112)
CO2: 24 meq/L (ref 19–32)
CREATININE: 1.12 mg/dL (ref 0.40–1.50)
Calcium: 9.4 mg/dL (ref 8.4–10.5)
GFR: 66.88 mL/min (ref >60.00)
Glucose, Bld: 104 mg/dL — ABNORMAL HIGH (ref 70–99)
Potassium: 4.4 mEq/L (ref 3.5–5.1)
Sodium: 139 mEq/L (ref 135–145)

## 2014-06-14 LAB — T4, FREE: FREE T4: 0.8 ng/dL (ref 0.60–1.60)

## 2014-06-14 LAB — TSH: TSH: 1.86 u[IU]/mL (ref 0.35–4.50)

## 2014-06-14 LAB — T3, FREE: T3, Free: 2.6 pg/mL (ref 2.3–4.2)

## 2014-06-14 NOTE — Progress Notes (Signed)
   Subjective:    Patient ID: Ricardo Sloan, male    DOB: 1931-07-09, 81 y.o.   MRN: [id number]  HPI For approximately a month he describes "skipping" of his heart with exercise. He has noted his heart rate as low as 41. He's noted some of the skipping with meal intake as well. He has no active GERD symptoms.  He does calisthenics as well as walking and all the housework. There is no associated exertional dyspnea, chest pain, dizziness, lightheadedness. He does have some fatigue but again only with the skipping.  His only stimulant intake is 2 cups of coffee a day.  GI ROS negative and cardiopulmonary symptoms are otherwise negative.   Review of Systems  Chest pain,  tachycardia, exertional dyspnea, paroxysmal nocturnal dyspnea, claudication or edema are absent.  Unexplained weight loss, abdominal pain, significant dyspepsia, dysphagia, melena, rectal bleeding, or persistently small caliber stools are denied.  No blurred, double ,loss of vision No constipation; diarrhea;hoarseness No change in nails,hair,skin No polyuria, polydipsia, or polyphagia No numbness or tingling; tremor No anxiety; depression; panic attacks No temperature intolerance to heat ,cold       Objective:   Physical Exam  Gen.:  Adequately nourished; in no acute distress Eyes: Extraocular motion intact; no lid lag , proptosis , or nystagmus Neck: full ROM; no masses ; thyroid normal  Heart: Normal rhythm and rate without significant murmur, gallop, or extra heart sounds Lungs: Chest clear to auscultation without rales,rales, wheezes Neuro:Deep tendon reflexes are equal and within normal limits; no tremor  Skin/Nails: Warm and dry without significant lesions or rashes; no onycholysis Lymphatic: no cervical or axillary LA Psych: Normally communicative and interactive; no abnormal mood or affect clinically.         Assessment & Plan:  #1 palpitations with exercise. PACs on EKG. EKG reveals poor R wave  progression leads V1-3. There are no ischemic ST-T changes progress  #2 fatigue also with exercise  Plan: See labs. Stress test will be requested.

## 2014-06-14 NOTE — Progress Notes (Signed)
Pre visit review using our clinic review tool, if applicable. No additional management support is needed unless otherwise documented below in the visit note. 

## 2014-06-14 NOTE — Patient Instructions (Signed)
  Your next office appointment will be determined based upon review of your pending labs   Those instructions will be transmitted to you through My Chart   Critical values will be called.  Followup as needed for any active or acute issue. Please report any significant change in your symptoms. 

## 2014-06-15 ENCOUNTER — Telehealth: Payer: Self-pay

## 2014-06-15 ENCOUNTER — Other Ambulatory Visit (INDEPENDENT_AMBULATORY_CARE_PROVIDER_SITE_OTHER): Payer: Medicare HMO

## 2014-06-15 DIAGNOSIS — R7309 Other abnormal glucose: Secondary | ICD-10-CM

## 2014-06-15 LAB — HEMOGLOBIN A1C: HEMOGLOBIN A1C: 6.1 % (ref 4.6–6.5)

## 2014-06-15 NOTE — Telephone Encounter (Signed)
Request for add on has been faxed to [facility] 

## 2014-06-15 NOTE — Telephone Encounter (Signed)
-----   Message from Hendricks Limes, MD sent at 06/14/2014  6:19 PM EDT ----- Please add A1c (R73.9)

## 2014-07-05 ENCOUNTER — Telehealth: Payer: Self-pay

## 2014-07-05 NOTE — Telephone Encounter (Signed)
Patient called to educate on Medicare Wellness apt. LVM for the patient to call back to educate and schedule for wellness visit.   

## 2014-07-06 ENCOUNTER — Other Ambulatory Visit: Payer: Self-pay | Admitting: Internal Medicine

## 2014-07-06 NOTE — Telephone Encounter (Signed)
Pt called in and said that he already has a Wellness visit setup in July ?  He said if that needs to change then call him back at home

## 2014-07-12 NOTE — Telephone Encounter (Signed)
Call placed to Ricardo Sloan to explain Medicare Wellness visit; the patient agreed to come in April 28th at 8:30am

## 2014-07-14 ENCOUNTER — Telehealth (HOSPITAL_COMMUNITY): Payer: Self-pay

## 2014-07-14 ENCOUNTER — Other Ambulatory Visit (HOSPITAL_COMMUNITY): Payer: Self-pay | Admitting: Internal Medicine

## 2014-07-14 DIAGNOSIS — I499 Cardiac arrhythmia, unspecified: Secondary | ICD-10-CM

## 2014-07-14 NOTE — Telephone Encounter (Signed)
Encounter complete. 

## 2014-07-15 ENCOUNTER — Telehealth (HOSPITAL_COMMUNITY): Payer: Self-pay

## 2014-07-15 NOTE — Telephone Encounter (Signed)
Encounter complete. 

## 2014-07-16 ENCOUNTER — Ambulatory Visit (HOSPITAL_COMMUNITY)
Admission: RE | Admit: 2014-07-16 | Discharge: 2014-07-16 | Disposition: A | Discharge status: Home / Self Care | Payer: Medicare HMO | Source: Ambulatory Visit | Attending: Physician Assistant | Admitting: Physician Assistant

## 2014-07-16 DIAGNOSIS — I499 Cardiac arrhythmia, unspecified: Secondary | ICD-10-CM

## 2014-07-16 NOTE — Procedures (Signed)
Exercise Treadmill Test Test  Exercise Tolerance Test Ordering MD: Unice Cobble, MD    Unique Test No: 1  Treadmill:  1  Indication for ETT: Palpitations and Fatigue  Contraindication to ETT: No   Stress Modality: exercise - treadmill  Cardiac Imaging Performed: non   Protocol: standard Bruce - maximal  Max BP:  189/90  Max MPHR (bpm):  139 85% MPR (bpm):  118  MPHR obtained (bpm):  150 % MPHR obtained:  108  Reached 85% MPHR (min:sec):  2:40 Total Exercise Time (min-sec):  6  Workload in METS:  7.0 Borg Scale: 14  Reason ETT Terminated:  dyspnea    ST Segment Analysis At Rest: NSR, cannot R/O prior septal MI With Exercise: significant ischemic ST depression  Other Information Arrhythmia:  Occasional PAC\ Angina during ETT:  absent (0) Quality of ETT:  diagnostic  ETT Interpretation:  abnormal - evidence of ST depression consistent with ischemia  Comments: ETT with no chest pain; normal BP response; total exercise time 6:00; ST depression in the inferior lateral leads at peak exercise; suggest stress nuclear study for further evaluation.  Kirk Ruths

## 2014-07-17 ENCOUNTER — Other Ambulatory Visit: Payer: Self-pay | Admitting: Internal Medicine

## 2014-07-17 DIAGNOSIS — R002 Palpitations: Secondary | ICD-10-CM

## 2014-07-17 DIAGNOSIS — R9431 Abnormal electrocardiogram [ECG] [EKG]: Secondary | ICD-10-CM

## 2014-07-22 ENCOUNTER — Ambulatory Visit (INDEPENDENT_AMBULATORY_CARE_PROVIDER_SITE_OTHER): Payer: Medicare HMO

## 2014-07-22 VITALS — BP 138/78 | Ht 71.0 in | Wt 166.8 lb

## 2014-07-22 DIAGNOSIS — Z0000 Encounter for general adult medical examination without abnormal findings: Secondary | ICD-10-CM

## 2014-07-22 DIAGNOSIS — Z23 Encounter for immunization: Secondary | ICD-10-CM

## 2014-07-22 NOTE — Patient Instructions (Signed)
  Mr. Lall , Thank you for taking time to come for your Medicare Wellness Visit. I appreciate your ongoing commitment to your health goals. Please review the following plan we discussed and let me know if I can assist you in the future.   These are the goals we discussed: Goals    . <enter goal here>     To continue to care for wife; Is working on transport for wife (w/c) to conserve her energy so they can go to the park and do other things together so she will have less pain Will also continue to spend time in the garden, which is his joy. Continue to exercise  Read Being Mortal, which has inspired him at this juncture in his life.       This is a list of the screening recommended for you and due dates:  Health Maintenance  Topic Date Due  . Pneumonia vaccines (2 of 2 - PCV13) 10/04/2010  . Flu Shot  10/25/2014  . Colon Cancer Screening  12/29/2014  . Tetanus Vaccine  06/24/2017  . Shingles Vaccine  Completed    Prevnar given today Will discuss colonoscopy with Dr. Linna Darner as it is due in Oct but the patient has aged out I will fup on long term care options and email the patient via mychart.

## 2014-07-22 NOTE — Progress Notes (Signed)
Subjective:   Ricardo Sloan is a 81 y.o. male who presents for Medicare Annual/Subsequent preventive examination.  Review of Systems:  HRA assessment completed during visit  Health better, the same or worse than last year? same  Health described as excellent; very good; good; fair or poor? Very good  Exercise: Gardening ; care for wife; community work  Current dietary : states he eat healthy BMI normal  Psychosocial changes in the last year; moves; losses of family;spouse has entered another "crossroads" where current treatment is no longer as effective and will be reviewing options for ongoing treatment as well as possibly getting a w/c or transport chair to support energy conservation and travel  Vision: Vision test annually    Generalized Safety in the home reviewed  Review Firearm safety as appropriate;   Emergency Plan for illness or other ; in progress Driving : no issue Sun protection         Objective:    Vitals: BP 138/78 mmHg  Ht 5\' 11"  (1.803 m)  Wt 166 lb 12 oz (75.637 kg)  BMI 23.27 kg/m2  Tobacco History  Smoking status  . Former Smoker  . Quit date: 03/26/1965  Smokeless tobacco  . Never Used    Comment: smoked 1953-1968, up to 1 ppd     Counseling given: Not Answered   Past Medical History  Diagnosis Date  . Hyperlipidemia   . Hypertension   . BPH (benign prostatic hypertrophy)   . GERD (gastroesophageal reflux disease)     Upper endoscopy 2003, Dr. Velora Heckler  . Hemorrhoids   . Microscopic hematuria 2001    Dr.  Luanne Bras  . Pneumonia      X 2 in high school  . Hiatal hernia 2010  . Internal hemorrhoids    Past Surgical History  Procedure Laterality Date  . Tonsillectomy         . Colonoscopy  [id number]    Internal hemorrhoids, Dr. Velora Heckler  . Nose surgery      Submucosal resection in the 1960s  . Upper gastrointestinal endoscopy  2003    GERD  . Wisdom tooth extraction     Family History  Problem Relation Age  of Onset  . Diabetes Mother   . Subarachnoid hemorrhage Daughter   . Lung cancer Brother     smoker  . Heart disease Neg Hx   . Stroke Neg Hx   . Hypertension Neg Hx   . Colon cancer Neg Hx    History  Sexual Activity  . Sexual Activity: Not on file    Outpatient Encounter Prescriptions as of 07/22/2014  Medication Sig  . amLODipine (NORVASC) 5 MG tablet TAKE 1 TABLET BY MOUTH EVERY DAY  . Aspirin (ADULT ASPIRIN LOW STRENGTH) 81 MG EC tablet Take 81 mg by mouth daily.    Marland Kitchen loratadine (CLARITIN) 10 MG tablet Take 10 mg by mouth daily.    . Multiple Vitamin (MULTIVITAMIN) tablet Take 1 tablet by mouth daily.    Marland Kitchen omeprazole (PRILOSEC) 40 MG capsule TAKE 1 CAPSULE BY MOUTH EVERY DAY  . pravastatin (PRAVACHOL) 40 MG tablet TAKE 1/2 TABLET BY MOUTH AT BEDTIME  . sildenafil (REVATIO) 20 MG tablet 3 qd prn (Patient not taking: Reported on 07/22/2014)    Activities of Daily Living In your present state of health, do you have any difficulty performing the following activities: 07/22/2014 06/14/2014  Hearing? N Y  Vision? N Y  Difficulty concentrating or making decisions? N N  Walking or climbing stairs? N N  Dressing or bathing? N N  Doing errands, shopping? N N  Preparing Food and eating ? N -  Using the Toilet? N -  In the past six months, have you accidently leaked urine? N -  Do you have problems with loss of bowel control? N -  Managing your Medications? N -  Managing your Finances? N -  Housekeeping or managing your Housekeeping? N -    Patient Care Team: Hendricks Limes, MD as PCP - General Irene Shipper, MD as Consulting Physician (Gastroenterology) Darlin Coco, MD as Consulting Physician (Cardiology)   Assessment:    CC risk: currently being worked up for irregualar heart rate that was no present today. The patient is to fup with cardiology and apt is June 3rd.  Reviewed stress test and educated ongoing eval indicated per cardiology consult  Personalized  Education given regarding: the patient requested information on long term care options/ will review and email him information via my chart Pt determined a personalized goal: The patient is happy with his life; good family support; enjoys grand-children; Has adjusted to retirement although it is not what he had envisioned when he retired  Assessment included   Stress: His personal stress is very low   Risk for hepatitis or high risk social behavior: The patient reviewed screened and denied risk  Risk for Depression: Caregiver risk but the patient denies any depression or mood changes    Safety assessed (driving issues; vision; home; environment; support)  Cognition assessed by AD8; Score 0 (A score of 2 or greater would indicate the MMSE be completed)    Need for Immunizations or other screenings identified;  (CDC recommmend Prevnar at 65 followed by pnuemovax 23 in one year or 5 years after the last dose. The patient agreed to take the [place] today  Health Maintenance up to date and a Preventive Wellness Plan was given to the patient  Exercise Activities and Dietary recommendations Current Exercise Habits:: Home exercise routine (likes gardening; very mobile; )  Goals    . <enter goal here>     To continue to care for wife; Is working on transport for wife (w/c) to conserve her energy so they can go to the park and do other things together so she will have less pain Will also continue to spend time in the garden, which is his joy. Continue to exercise  Read Being Mortal, which has inspired him at this juncture in his life.      Fall Risk Fall Risk  07/22/2014 06/14/2014 10/15/2012  Falls in the past year? - No No  Risk for fall due to : (No Data) - -  Risk for fall due to (comments): no fall risk identified - -   Depression Screen PHQ 2/9 Scores 07/22/2014 06/14/2014 10/15/2012  PHQ - 2 Score 0 0 0    Cognitive Testing MMSE - Mini Mental State Exam 07/22/2014  Not  completed: Unable to complete    Immunization History  Administered Date(s) Administered  . Influenza Split 12/21/2011  . Influenza Whole 01/21/2007  . Influenza,inj,Quad PF,36+ Mos 12/19/2012, 12/28/2013  . Pneumococcal Polysaccharide-23 03/14/2004, 10/03/2009  . Td 03/27/1995, 07/23/2005  . Tdap 06/25/2007  . Zoster 01/30/2007   Screening Tests Health Maintenance  Topic Date Due  . PNA vac Low Risk Adult (2 of 2 - PCV13) 10/04/2010  . INFLUENZA VACCINE  10/25/2014  . COLONOSCOPY  12/29/2014  . TETANUS/TDAP  06/24/2017  . ZOSTAVAX  Completed      Plan:    Plan   The patient agrees to: Continue to find time to garden Will discuss need for colonoscopy with Dr. Linna Darner.  Advanced directive: completed; does need to do estate planning  During the course of the visit the patient was educated and counseled about the following appropriate screening and preventive services:   Vaccines to include Pneumoccal, Influenza, Hepatitis B, Td, Zostavax, HCV/   Prevnar completed   Electrocardiogram- referral underway with cardiology  Cardiovascular Disease; no s/s; BP < 728 systolic today; under evaluation by cardiology  Colorectal cancer screening - to discuss with Dr. Linna Darner due to age  Diabetes screening; n/a  Prostate Cancer Screening deferred  Glaucoma screening eye checked biannually  Nutrition counseling - eats well; no issue identified at this time  Smoking cessation counseling  I will email the patient the information he requested on long term care planning  VTVNR,WCHJS, RN  07/22/2014

## 2014-07-29 ENCOUNTER — Other Ambulatory Visit (INDEPENDENT_AMBULATORY_CARE_PROVIDER_SITE_OTHER): Payer: Medicare HMO

## 2014-07-29 ENCOUNTER — Ambulatory Visit (INDEPENDENT_AMBULATORY_CARE_PROVIDER_SITE_OTHER): Payer: Medicare HMO | Admitting: Internal Medicine

## 2014-07-29 ENCOUNTER — Telehealth: Payer: Self-pay

## 2014-07-29 ENCOUNTER — Encounter: Payer: Self-pay | Admitting: Internal Medicine

## 2014-07-29 VITALS — BP 150/86 | HR 79 | Temp 97.7°F | Ht 71.0 in | Wt 162.5 lb

## 2014-07-29 DIAGNOSIS — R1011 Right upper quadrant pain: Secondary | ICD-10-CM

## 2014-07-29 DIAGNOSIS — A09 Infectious gastroenteritis and colitis, unspecified: Secondary | ICD-10-CM

## 2014-07-29 DIAGNOSIS — R197 Diarrhea, unspecified: Secondary | ICD-10-CM

## 2014-07-29 DIAGNOSIS — R34 Anuria and oliguria: Secondary | ICD-10-CM | POA: Diagnosis not present

## 2014-07-29 LAB — BASIC METABOLIC PANEL
BUN: 19 mg/dL (ref 6–23)
CHLORIDE: 102 meq/L (ref 96–112)
CO2: 26 mEq/L (ref 19–32)
Calcium: 9.3 mg/dL (ref 8.4–10.5)
Creatinine, Ser: 1.27 mg/dL (ref 0.40–1.50)
GFR: 57.83 mL/min — ABNORMAL LOW (ref >60.00)
GLUCOSE: 119 mg/dL — AB (ref 70–99)
POTASSIUM: 4.5 meq/L (ref 3.5–5.1)
Sodium: 134 mEq/L — ABNORMAL LOW (ref 135–145)

## 2014-07-29 LAB — CBC WITH DIFFERENTIAL/PLATELET
BASOS PCT: 0.4 % (ref 0.0–3.0)
Basophils Absolute: 0.1 10*3/uL (ref 0.0–0.1)
EOS PCT: 0.5 % (ref 0.0–5.0)
Eosinophils Absolute: 0.1 10*3/uL (ref 0.0–0.7)
HEMATOCRIT: 43.3 % (ref 39.0–52.0)
Hemoglobin: 15 g/dL (ref 13.0–17.0)
LYMPHS ABS: 1.6 10*3/uL (ref 0.7–4.0)
Lymphocytes Relative: 12.1 % (ref 12.0–46.0)
MCHC: 34.6 g/dL (ref 30.0–36.0)
MCV: 89.6 fl (ref 78.0–100.0)
MONO ABS: 1.3 10*3/uL — AB (ref 0.1–1.0)
Monocytes Relative: 9.5 % (ref 3.0–12.0)
Neutro Abs: 10.2 10*3/uL — ABNORMAL HIGH (ref 1.4–7.7)
Neutrophils Relative %: 77.5 % — ABNORMAL HIGH (ref 43.0–77.0)
Platelets: 221 10*3/uL (ref 150.0–400.0)
RBC: 4.83 Mil/uL (ref 4.22–5.81)
RDW: 14.4 % (ref 11.5–15.5)
WBC: 13.2 10*3/uL — ABNORMAL HIGH (ref 4.0–10.5)

## 2014-07-29 LAB — HEPATIC FUNCTION PANEL
ALT: 16 U/L (ref 0–53)
AST: 16 U/L (ref 0–37)
Albumin: 3.8 g/dL (ref 3.5–5.2)
Alkaline Phosphatase: 71 U/L (ref 39–117)
BILIRUBIN TOTAL: 0.7 mg/dL (ref 0.2–1.2)
Bilirubin, Direct: 0.2 mg/dL (ref 0.0–0.3)
Total Protein: 6.7 g/dL (ref 6.0–8.3)

## 2014-07-29 MED ORDER — CIPROFLOXACIN HCL 500 MG PO TABS: 500.0000 mg | ORAL_TABLET | Freq: Two times a day (BID) | ORAL | Status: DC

## 2014-07-29 MED ORDER — METRONIDAZOLE 500 MG PO TABS: 500.0000 mg | ORAL_TABLET | Freq: Three times a day (TID) | ORAL | Status: DC

## 2014-07-29 NOTE — Progress Notes (Signed)
Pre visit review using our clinic review tool, if applicable. No additional management support is needed unless otherwise documented below in the visit note. 

## 2014-07-29 NOTE — Telephone Encounter (Signed)
Call to speak to Mr. Ricardo Sloan, but wife stated he was coming in to see Dr Linna Darner today at 10:30  Prepared information regarding request for Long term care access and options and will give to the patient today. Information taken from the [employer]; [email or web address]  Gave number of "options counselor" with the [employer] for overview.

## 2014-07-29 NOTE — Progress Notes (Signed)
   Subjective:    Patient ID: Ricardo Sloan, male    DOB: Dec 11, 1931, 81 y.o.   MRN: [id number]  HPI His symptoms began in the evening of 5/1 /16 as pasty stools which progressed to loose stools. Over the next several days this progressed and was associated with bloody diarrhea 5/3.  He notes some decreased urine volume.He also had dyspepsia for which he took Tagamet. There've been no trigger for this. He's had no antibodies in the last 3 months. He does not ingest well water and is not exposed to sick pets. There is no suspicious food ingestion, travel or exposure to ill individuals.   He has associated cramping abdominal pain which lasts minutes. This is worse in the right lower quadrant.    He had a remote history of colon polyps. Colonoscopy was negative in 2006. There is some question about whether follow-up was necessary based on a letter he received in 2010 from Dr. Henrene Pastor. He has no history of diverticulosis.  Family history is negative for GI disease.   Review of Systems  He denies fever, chills, sweats. There's been no melenous character to his stools.    Objective:   Physical Exam  Pertinent or positive findings include: he has a hearing aid  on the right ; hearing is still impaired. Tongue is moist.  He is slightly tender in the RIGHT  upper quadrant to palpation. Scant mucoid stool is present with specks of red. This is Hemoccult positive on testing.  Prostate is normal.  There is slight tenting of the skin.  Cherry angiomata present over the upper abdomen.  He has isolated DIP arthritic changes.   He has minimal crepitus of the knees.  General appearance :adequately nourished; in no distress. Eyes: No conjunctival inflammation or scleral icterus is present. Oral exam:  Lips and gums are healthy appearing.There is no oropharyngeal erythema or exudate noted. Dental hygiene is good. Heart:  Normal rate and regular rhythm. S1 and S2 normal without gallop, murmur, click, rub  or other extra sounds   Lungs:Chest clear to auscultation; no wheezes, rhonchi,rales ,or rubs present.No increased work of breathing.  Abdomen: bowel sounds normal, soft  without masses, organomegaly or hernias noted.  No guarding or rebound.  Vascular : all pulses equal ; no bruits present. Skin:Warm & dry.  Intact without suspicious lesions or rashes ; no jaundice  Lymphatic: No lymphadenopathy is noted about the head, neck, axilla, or inguinal areas.  Neuro: Strength, tone & DTRs normal.        Assessment & Plan:   #1 diarrhea ; probable acute diverticulitis    #2 reported decreased urine volume   Plan: See orders and recommendation

## 2014-07-29 NOTE — Patient Instructions (Signed)
Stay on clear liquids for 48-72 hours or until bowels are normal.This would include  jello, sherbert (NOT ice cream), Lipton's chicken noodle soup(NOT cream based soups),Gatorade Lite, flat Ginger ale (without High Fructose Corn Syrup),dry toast or crackers, baked potato.No milk , dairy or grease until bowels are formed. Florastor OR Align , a [employer] , daily if stools are loose. Immodium AD for frankly watery stool. Go to ER if experiencing increasing pain, fever or progressive rectal bleeding

## 2014-08-04 ENCOUNTER — Other Ambulatory Visit: Payer: Self-pay | Admitting: Internal Medicine

## 2014-08-04 ENCOUNTER — Encounter: Payer: Medicare HMO | Admitting: Physician Assistant

## 2014-08-04 ENCOUNTER — Telehealth: Payer: Self-pay | Admitting: Internal Medicine

## 2014-08-04 DIAGNOSIS — K5732 Diverticulitis of large intestine without perforation or abscess without bleeding: Secondary | ICD-10-CM

## 2014-08-04 NOTE — Telephone Encounter (Signed)
Phone call to patient. He states diverticulitis is fine. No blood discharge since Saturday. He would like to know what foods to avoid? What can he take to get his bowels going?

## 2014-08-04 NOTE — Telephone Encounter (Signed)
Patient advised.

## 2014-08-04 NOTE — Telephone Encounter (Signed)
Pt called and had a few question for nurse.  He was suppose to check back in hop to let him know how he was doing.     Best number [phone]

## 2014-08-04 NOTE — Telephone Encounter (Signed)
That's great; recheck CBC & see me to outline long term follow up

## 2014-08-05 ENCOUNTER — Other Ambulatory Visit: Payer: Self-pay | Admitting: Internal Medicine

## 2014-08-05 ENCOUNTER — Other Ambulatory Visit (INDEPENDENT_AMBULATORY_CARE_PROVIDER_SITE_OTHER): Payer: Medicare HMO

## 2014-08-05 DIAGNOSIS — K5732 Diverticulitis of large intestine without perforation or abscess without bleeding: Secondary | ICD-10-CM

## 2014-08-05 DIAGNOSIS — K5733 Diverticulitis of large intestine without perforation or abscess with bleeding: Secondary | ICD-10-CM

## 2014-08-05 LAB — CBC WITH DIFFERENTIAL/PLATELET
Basophils Absolute: 0.1 10*3/uL (ref 0.0–0.1)
Basophils Relative: 0.7 % (ref 0.0–3.0)
EOS ABS: 0.2 10*3/uL (ref 0.0–0.7)
Eosinophils Relative: 1.9 % (ref 0.0–5.0)
HEMATOCRIT: 37.5 % — AB (ref 39.0–52.0)
HEMOGLOBIN: 12.8 g/dL — AB (ref 13.0–17.0)
LYMPHS ABS: 1.8 10*3/uL (ref 0.7–4.0)
Lymphocytes Relative: 22.2 % (ref 12.0–46.0)
MCHC: 34.1 g/dL (ref 30.0–36.0)
MCV: 89.5 fl (ref 78.0–100.0)
MONOS PCT: 12.2 % — AB (ref 3.0–12.0)
Monocytes Absolute: 1 10*3/uL (ref 0.1–1.0)
NEUTROS ABS: 5 10*3/uL (ref 1.4–7.7)
Neutrophils Relative %: 63 % (ref 43.0–77.0)
Platelets: 324 10*3/uL (ref 150.0–400.0)
RBC: 4.19 Mil/uL — ABNORMAL LOW (ref 4.22–5.81)
RDW: 14 % (ref 11.5–15.5)
WBC: 7.9 10*3/uL (ref 4.0–10.5)

## 2014-08-05 NOTE — Telephone Encounter (Signed)
Natural interventions to treat or prevent constipation would include drinking to thirst, up to 32 ounces of fluids daily; eating 7-9 servings of fresh fruits or vegetables a day; and increasing roughage in the diet such as whole grains. The OTC  fiber products (Example: Metamucil, etc) can be employed if these natural maneuvers do not correct the issue. Finally MiraLax every third day as needed is an option.

## 2014-08-05 NOTE — Telephone Encounter (Signed)
Patient has been advised

## 2014-08-05 NOTE — Telephone Encounter (Signed)
Patient states that his questions were not answered. He was hoping that you could call and leave a VM for him with the answer to the following question :  - What should he take if he has problem starting a BM after diverticulitis treatment

## 2014-08-26 ENCOUNTER — Ambulatory Visit (INDEPENDENT_AMBULATORY_CARE_PROVIDER_SITE_OTHER): Payer: Medicare HMO | Admitting: Cardiology

## 2014-08-26 ENCOUNTER — Encounter: Payer: Self-pay | Admitting: Cardiology

## 2014-08-26 VITALS — BP 142/70 | HR 60 | Ht 71.0 in | Wt 162.4 lb

## 2014-08-26 DIAGNOSIS — I10 Essential (primary) hypertension: Secondary | ICD-10-CM

## 2014-08-26 DIAGNOSIS — R002 Palpitations: Secondary | ICD-10-CM | POA: Diagnosis not present

## 2014-08-26 DIAGNOSIS — R9439 Abnormal result of other cardiovascular function study: Secondary | ICD-10-CM | POA: Diagnosis not present

## 2014-08-26 NOTE — Patient Instructions (Signed)
Medication Instructions:  Your physician recommends that you continue on your current medications as directed. Please refer to the Current Medication list given to you today.   Labwork: NONE   Testing/Procedures: Your physician has requested that you have en exercise stress myoview. For further information please visit [email or web address]. Please follow instruction sheet, as given.  Follow-Up: AS NEEDED   

## 2014-08-26 NOTE — Progress Notes (Signed)
Cardiology Office Note   Date:  08/26/2014   ID:  Ricardo, Sloan 09-Oct-1931, MRN [id number]  PCP:  Unice Cobble, MD  Cardiologist: Darlin Coco MD  Chief Complaint  Patient presents with  . Appointment    new pt, palps      History of Present Illness: Ricardo Sloan is a 81 y.o. male who presents for cardiology evaluation.  This pleasant 81 year old gentleman is a long-time patient of Dr. Unice Cobble.  The patient is being seen for evaluation of palpitations and he also had a recent abnormal stress test.  He has not been experiencing any chest discomfort.  He does have a long history of intermittent palpitations.  His palpitations are of several types.  One is an occasional skip or premature beat.  The other type is where every other beat appears to be a PVC.  These episodes tend to occur when he is at rest such as reading a book or preparing for bed.  When he is physically active he is not aware of his heart skipping.  He has generally good exercise tolerance.  He denies any chest pain.  He does have shortness of breath walking up hills.  He has not had any syncopal episodes.  He had a Bruce protocol treadmill stress test on 07/16/14 which was abnormal because of the development of painless ST segment depression in the inferolateral leads.  He was able to walk 6 minutes on the treadmill. His family history reveals that his father died at age 3 of pneumonia.  His mother died at age 77 of old age.  There is no history of premature coronary disease in the family. The patient smoked for 15 years starting in high school.  He quit in about 1967.  He drinks an occasional glass of beer or wine.  He drinks 2 cups of regular coffee in the morning and no additional caffeine His social history reveals that he is married.  He has lived in [place] most of his adult life.  He and his wife have 2 children.  His daughter died tragically of a cerebral hemorrhage at age 54.  She had 2  children with a foam are doing well.  Patient also has a son who lives in [place] and is in good health.  The patient is married.  His wife has severe crippling rheumatoid arthritis.  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 Past medical history reveals that the patient has a history of hypertension.  He has a history of diverticulosis and had a recent episode of diverticulitis treated successfully with ciprofloxacin and Atrovent is old.  He is not diabetic.  He does have a history of hypercholesterolemia and is on pravastatin.    Past Medical History  Diagnosis Date  . Hyperlipidemia   . Hypertension   . BPH (benign prostatic hypertrophy)   . GERD (gastroesophageal reflux disease)     Upper endoscopy 2003, Dr. Velora Heckler  . Hemorrhoids   . Microscopic hematuria 2001    Dr.  Luanne Bras  . Pneumonia      X 2 in high school  . Hiatal hernia 2010  . Internal hemorrhoids     Past Surgical History  Procedure Laterality Date  . Tonsillectomy         . Colonoscopy  [id number]    Internal hemorrhoids, Dr. Velora Heckler  . Nose surgery      Submucosal resection in the 1960s  .  Upper gastrointestinal endoscopy  2003    GERD  . Wisdom tooth extraction       Current Outpatient Prescriptions  Medication Sig Dispense Refill  . amLODipine (NORVASC) 5 MG tablet TAKE 1 TABLET BY MOUTH EVERY DAY 90 tablet 1  . Aspirin (ADULT ASPIRIN LOW STRENGTH) 81 MG EC tablet Take 81 mg by mouth daily.      Marland Kitchen loratadine (CLARITIN) 10 MG tablet Take 10 mg by mouth daily.      . Multiple Vitamin (MULTIVITAMIN) tablet Take 1 tablet by mouth daily.      Marland Kitchen omeprazole (PRILOSEC) 40 MG capsule TAKE 1 CAPSULE BY MOUTH EVERY DAY 30 capsule 5  . pravastatin (PRAVACHOL) 40 MG tablet TAKE 1/2 TABLET BY MOUTH AT BEDTIME 45 tablet 1   No current facility-administered medications for this visit.    Allergies:   Review of patient's allergies indicates no known allergies.    Social  History:  The patient  reports that he quit smoking about 49 years ago. He has never used smokeless tobacco. He reports that he drinks alcohol. He reports that he does not use illicit drugs.   Family History:  The patient's family history includes Diabetes in his mother; Lung cancer in his brother; Subarachnoid hemorrhage in his daughter. There is no history of Heart disease, Stroke, Hypertension, or Colon cancer.    ROS:  Please see the history of present illness.   Otherwise, review of systems are positive for none.   All other systems are reviewed and negative.    PHYSICAL EXAM: VS:  BP 142/70 mmHg  Pulse 60  Ht 5\' 11"  (1.803 m)  Wt 162 lb 6.4 oz (73.664 kg)  BMI 22.66 kg/m2 , BMI Body mass index is 22.66 kg/(m^2). GEN: Well nourished, well developed, in no acute distress HEENT: normal Neck: no JVD, carotid bruits, or masses Cardiac: RRR; no murmurs, rubs, or gallops,no edema  Respiratory:  clear to auscultation bilaterally, normal work of breathing GI: soft, nontender, nondistended, + BS MS: no deformity or atrophy Skin: warm and dry, no rash Neuro:  Strength and sensation are intact Psych: euthymic mood, full affect   EKG:  EKG is ordered today. The ekg ordered today demonstrates normal sinus rhythm with poor R-wave progression V1 through V2 and no ischemic changes at rest.  Low voltage limb leads.   Recent Labs: 06/14/2014: TSH 1.86 07/29/2014: ALT 16; BUN 19; Creatinine 1.27; Potassium 4.5; Sodium 134* 08/05/2014: Hemoglobin 12.8*; Platelets 324.0    Lipid Panel    Component Value Date/Time   CHOL 158 10/19/2013 1038   CHOL 157 10/15/2012 1026   TRIG 68 10/19/2013 1038   TRIG 57.0 10/15/2012 1026   HDL 49 10/19/2013 1038   HDL 48.90 10/15/2012 1026   CHOLHDL 3 10/15/2012 1026   VLDL 11.4 10/15/2012 1026   LDLCALC 95 10/19/2013 1038   LDLCALC 97 10/15/2012 1026      Wt Readings from Last 3 Encounters:  08/26/14 162 lb 6.4 oz (73.664 kg)  07/29/14 162 lb 8 oz  (73.71 kg)  07/22/14 166 lb 12 oz (75.637 kg)      Other studies Reviewed: Additional studies/ records that were reviewed today include: Chest x-ray on 10/15/12. Review of the above records demonstrates: Normal heart size   ASSESSMENT AND PLAN:  1.  Abnormal treadmill stress test suggestive of inferolateral ischemia but may be a false positive. 2.  Essential hypertension 3.  Intermittent palpitations predominantly at rest  Recommendation: We will proceed with  a treadmill Myoview stress test to evaluate him further.  At a later date if his symptoms of palpitations persist we may wish to have him wear a 30 day monitor to evaluate his arrhythmia further. Many thanks for the opportunity to see this pleasant gentleman with you.  I will be in touch with you regarding the results of his Myoview stress test.   Current medicines are reviewed at length with the patient today.  The patient does not have concerns regarding medicines.  The following changes have been made:  no change  Labs/ tests ordered today include:  Orders Placed This Encounter  Procedures  . Myocardial Perfusion Imaging  . EKG 12-Lead    Signed, Darlin Coco MD 08/26/2014 4:56 PM    [facility] [address], [place], [place]  [address] Phone: [phone]; Fax: [phone]

## 2014-08-31 ENCOUNTER — Ambulatory Visit (INDEPENDENT_AMBULATORY_CARE_PROVIDER_SITE_OTHER): Payer: Medicare HMO | Admitting: Internal Medicine

## 2014-08-31 ENCOUNTER — Encounter: Payer: Self-pay | Admitting: Internal Medicine

## 2014-08-31 VITALS — BP 138/72 | HR 72 | Ht 71.0 in | Wt 161.8 lb

## 2014-08-31 DIAGNOSIS — K529 Noninfective gastroenteritis and colitis, unspecified: Secondary | ICD-10-CM

## 2014-08-31 NOTE — Patient Instructions (Addendum)
Please follow up as needed 

## 2014-08-31 NOTE — Progress Notes (Signed)
HISTORY OF PRESENT ILLNESS:  Ricardo Sloan is a 81 y.o. male with past medical history as listed below. He is referred today by his primary care physician Dr. Linna Darner regarding recent problems with change in bowel habit and rectal bleeding. Patient was last seen in this office 3 years ago to evaluate swallowing difficulties. The current history is that of abrupt onset of loose bowels approximately 4 weeks ago. Subsequently developed the passage of mucus and minor bleeding per rectum. Saw Dr. Linna Darner was given the presumptive diagnosis of diverticulitis. He was treated with ciprofloxacin and metronidazole. Advised to use Imodium. After taking one dose of Imodium, his abnormalities of bowel habits and bleeding abruptly resolved. Since that time the patient felt a bit fatigued, but this has resolved. He is now back at baseline. He denies abdominal pain or weight loss. He has had multiple colonoscopies in the pastwithout significant abnormalities. Last colonoscopy October 2006 was normal except for internal hemorrhoids.  REVIEW OF SYSTEMS:  All non-GI ROS negative upon comprehensive review today  Past Medical History  Diagnosis Date  . Hyperlipidemia   . Hypertension   . BPH (benign prostatic hypertrophy)   . GERD (gastroesophageal reflux disease)     Upper endoscopy 2003, Dr. Velora Heckler  . Hemorrhoids   . Microscopic hematuria 2001    Dr.  Luanne Bras  . Pneumonia      X 2 in high school  . Hiatal hernia 2010  . Internal hemorrhoids   . Colon polyps     Past Surgical History  Procedure Laterality Date  . Tonsillectomy         . Colonoscopy  [id number]    Internal hemorrhoids, Dr. Velora Heckler  . Nose surgery      Submucosal resection in the 1960s  . Upper gastrointestinal endoscopy  2003    GERD  . Wisdom tooth extraction      Social History [name]  reports that he quit smoking about 49 years ago. He has never used smokeless tobacco. He reports that he drinks alcohol. He  reports that he does not use illicit drugs.  family history includes Diabetes in his mother; Lung cancer in his brother; Subarachnoid hemorrhage in his daughter. There is no history of Heart disease, Stroke, Hypertension, or Colon cancer.  No Known Allergies     PHYSICAL EXAMINATION: Vital signs: BP 138/72 mmHg  Pulse 72  Ht 5\' 11"  (1.803 m)  Wt 161 lb 12.8 oz (73.392 kg)  BMI 22.58 kg/m2 General: Well-developed, well-nourished, no acute distress HEENT: Sclerae are anicteric, conjunctiva pink. Oral mucosa intact Lungs: Clear Heart: Regular Abdomen: soft, nontender, nondistended, no obvious ascites, no peritoneal signs, normal bowel sounds. No organomegaly. Extremities: No edema Psychiatric: alert and oriented x3. Cooperative    ASSESSMENT:  #1. Acute gastroenteritis with loose stools and transient mucus with blood per rectum. Resolved #2. Colonoscopy 2006 normal   PLAN:  #1. Expectant management. No further workup or treatment required at this time. However, if he has recurrent difficulties he has been instructed to contact the office.

## 2014-09-14 ENCOUNTER — Telehealth (HOSPITAL_COMMUNITY): Payer: Self-pay | Admitting: *Deleted

## 2014-09-14 NOTE — Telephone Encounter (Signed)
Left message on voicemail in reference to upcoming appointment scheduled for 09/16/14. Phone number given for a call back so details instructions can be given. Ricardo Sloan, Ricardo Sloan

## 2014-09-14 NOTE — Telephone Encounter (Signed)
Patient given detailed instructions per Myocardial Perfusion Study Information Sheet for test on 09/16/14 at 0815. Patient Notified to arrive 15 minutes early, and that it is imperative to arrive on time for appointment to keep from having the test rescheduled. Patient verbalized understanding. Maigan Bittinger, Ranae Palms

## 2014-09-16 ENCOUNTER — Ambulatory Visit (HOSPITAL_COMMUNITY): Payer: Medicare HMO | Attending: Cardiology

## 2014-09-16 DIAGNOSIS — R0602 Shortness of breath: Secondary | ICD-10-CM | POA: Diagnosis not present

## 2014-09-16 DIAGNOSIS — R0609 Other forms of dyspnea: Secondary | ICD-10-CM | POA: Diagnosis not present

## 2014-09-16 DIAGNOSIS — I10 Essential (primary) hypertension: Secondary | ICD-10-CM | POA: Diagnosis not present

## 2014-09-16 DIAGNOSIS — R9439 Abnormal result of other cardiovascular function study: Secondary | ICD-10-CM

## 2014-09-16 DIAGNOSIS — R002 Palpitations: Secondary | ICD-10-CM | POA: Diagnosis not present

## 2014-09-16 LAB — MYOCARDIAL PERFUSION IMAGING
CHL CUP MPHR: 139 {beats}/min
CHL CUP NUCLEAR SDS: 2
CHL CUP RESTING HR STRESS: 56 {beats}/min
CSEPED: 6 min
CSEPEDS: 10 s
CSEPHR: 109 %
CSEPPHR: 151 {beats}/min
Estimated workload: 7.2 METS
LV dias vol: 104 mL
LV sys vol: 49 mL
RATE: 0.27
RPE: 15
SRS: 0
SSS: 2
TID: 0.96

## 2014-09-16 MED ORDER — TECHNETIUM TC 99M SESTAMIBI GENERIC - CARDIOLITE
33.0000 | Freq: Once | INTRAVENOUS | Status: AC | PRN
  Administered 2014-09-16: 33 via INTRAVENOUS

## 2014-09-16 MED ORDER — TECHNETIUM TC 99M SESTAMIBI GENERIC - CARDIOLITE
11.0000 | Freq: Once | INTRAVENOUS | Status: AC | PRN
  Administered 2014-09-16: 11 via INTRAVENOUS

## 2014-09-20 ENCOUNTER — Telehealth: Payer: Self-pay | Admitting: Cardiology

## 2014-09-20 MED ORDER — METOPROLOL SUCCINATE ER 25 MG PO TB24: 25.0000 mg | ORAL_TABLET | Freq: Every day | ORAL | Status: DC

## 2014-09-20 NOTE — Telephone Encounter (Signed)
New message  ° ° °Patient calling for test results.   °

## 2014-09-20 NOTE — Telephone Encounter (Signed)
Notified of stress test results.  Will send Rx into [facility] for Gen Toprol XL 25 mg.

## 2014-09-21 ENCOUNTER — Telehealth: Payer: Self-pay

## 2014-09-21 NOTE — Telephone Encounter (Signed)
Patient called wanting to know if he could increase his BP med that he is on instead of starting a new med

## 2014-09-21 NOTE — Telephone Encounter (Deleted)
Patient called for a refill on Imdur and states that her BP will run low and she wanted to know if she should get Imdur filled. After talking to Dr Tamala Julian he stated to let her stay on her current dose and to take some BP reading for a couple of weeks and to call back with the readings and he would make a decision then.Patient verbal understood

## 2014-09-21 NOTE — Telephone Encounter (Signed)
It would be better to add the new medication to help control his exertional systolic hypertension

## 2014-09-21 NOTE — Telephone Encounter (Signed)
Left message to call back  

## 2014-09-22 ENCOUNTER — Encounter: Payer: Self-pay | Admitting: Cardiology

## 2014-09-22 NOTE — Telephone Encounter (Signed)
Santo Held at 09/22/2014 9:18 AM     Status: Signed       Expand All Collapse All   New Message  Pr returned call

## 2014-09-22 NOTE — Telephone Encounter (Signed)
This encounter was created in error - please disregard.

## 2014-09-22 NOTE — Telephone Encounter (Signed)
New Message  Pr returned call

## 2014-09-22 NOTE — Telephone Encounter (Signed)
NO ANSWER, WILL TRY AGAIN TOMORROW

## 2014-09-22 NOTE — Telephone Encounter (Signed)
Follow UP  Pt returning [place] phone call.

## 2014-09-23 ENCOUNTER — Encounter: Payer: Self-pay | Admitting: Cardiology

## 2014-09-23 NOTE — Telephone Encounter (Signed)
Spoke with patient and gave him  Dr. Sherryl Barters recommendations, verbalized understanding

## 2014-09-23 NOTE — Telephone Encounter (Signed)
Tried to call patient on home number and cell number, no answer  Ricardo Sloan at 09/23/2014 8:52 AM     Status: Signed       Expand All Collapse All   Follow Up  Pt is returning call.

## 2014-09-23 NOTE — Telephone Encounter (Signed)
Follow Up: ° ° ° ° °Pt is returning call. °

## 2014-09-23 NOTE — Telephone Encounter (Signed)
This encounter was created in error - please disregard.

## 2014-10-21 ENCOUNTER — Ambulatory Visit (INDEPENDENT_AMBULATORY_CARE_PROVIDER_SITE_OTHER): Payer: Medicare HMO | Admitting: Internal Medicine

## 2014-10-21 ENCOUNTER — Other Ambulatory Visit (INDEPENDENT_AMBULATORY_CARE_PROVIDER_SITE_OTHER): Payer: Medicare HMO

## 2014-10-21 ENCOUNTER — Encounter: Payer: Self-pay | Admitting: Internal Medicine

## 2014-10-21 VITALS — BP 126/86 | HR 59 | Temp 97.8°F | Resp 16 | Ht 71.0 in | Wt 159.0 lb

## 2014-10-21 DIAGNOSIS — R7303 Prediabetes: Secondary | ICD-10-CM | POA: Insufficient documentation

## 2014-10-21 DIAGNOSIS — D649 Anemia, unspecified: Secondary | ICD-10-CM

## 2014-10-21 DIAGNOSIS — R739 Hyperglycemia, unspecified: Secondary | ICD-10-CM | POA: Diagnosis not present

## 2014-10-21 DIAGNOSIS — I10 Essential (primary) hypertension: Secondary | ICD-10-CM

## 2014-10-21 DIAGNOSIS — E785 Hyperlipidemia, unspecified: Secondary | ICD-10-CM

## 2014-10-21 LAB — IBC PANEL
IRON: 114 ug/dL (ref 42–165)
Saturation Ratios: 36 % (ref 20.0–50.0)
TRANSFERRIN: 226 mg/dL (ref 212.0–360.0)

## 2014-10-21 LAB — LIPID PANEL
Cholesterol: 145 mg/dL (ref 0–200)
HDL: 43.7 mg/dL (ref >39.00)
LDL CALC: 88 mg/dL (ref 0–99)
NonHDL: 100.87
Total CHOL/HDL Ratio: 3
Triglycerides: 63 mg/dL (ref 0.0–149.0)
VLDL: 12.6 mg/dL (ref 0.0–40.0)

## 2014-10-21 LAB — CBC WITH DIFFERENTIAL/PLATELET
BASOS PCT: 0.5 % (ref 0.0–3.0)
Basophils Absolute: 0 10*3/uL (ref 0.0–0.1)
EOS PCT: 2.6 % (ref 0.0–5.0)
Eosinophils Absolute: 0.1 10*3/uL (ref 0.0–0.7)
HCT: 38.4 % — ABNORMAL LOW (ref 39.0–52.0)
HEMOGLOBIN: 13.1 g/dL (ref 13.0–17.0)
Lymphocytes Relative: 36.3 % (ref 12.0–46.0)
Lymphs Abs: 1.9 10*3/uL (ref 0.7–4.0)
MCHC: 34.1 g/dL (ref 30.0–36.0)
MCV: 92.1 fl (ref 78.0–100.0)
MONOS PCT: 11.3 % (ref 3.0–12.0)
Monocytes Absolute: 0.6 10*3/uL (ref 0.1–1.0)
NEUTROS ABS: 2.6 10*3/uL (ref 1.4–7.7)
Neutrophils Relative %: 49.3 % (ref 43.0–77.0)
PLATELETS: 218 10*3/uL (ref 150.0–400.0)
RBC: 4.17 Mil/uL — AB (ref 4.22–5.81)
RDW: 14.5 % (ref 11.5–15.5)
WBC: 5.3 10*3/uL (ref 4.0–10.5)

## 2014-10-21 NOTE — Patient Instructions (Signed)
  Your next office appointment will be determined based upon review of your pending labs. Those written interpretation of the lab results and instructions will be transmitted to you by My Chart  Critical results will be called.   Followup as needed for any active or acute issue. Please report any significant change in your symptoms. 

## 2014-10-21 NOTE — Progress Notes (Signed)
   Subjective:    Patient ID: Ricardo Sloan, male    DOB: Jul 17, 1931, 81 y.o.   MRN: [id number]  HPI  The patient is here to assess status of active health conditions.  PMH, FH, & Social History reviewed & updated.No change in Rew as recorded.  He has been compliant with his medicines without adverse effects. He is on a heart healthy diet. He does calisthenics almost every day for 15-30 minutes with no cardiopulmonary symptoms. He has had an extensive cardiac evaluation for palpitations. He was also seen by gastroenterology. No endoscopic procedures were pursued.  He did have significant anemia; hematocrit dropped from 43.3 down to 37.5. He also has demonstrated hyperglycemia; glucose was 119 but A1c was 6.1 in March of this year. Lipids have not been checked since 7/15.  At this time he is asymptomatic.  Review of Systems Chest pain, palpitations, tachycardia, exertional dyspnea, paroxysmal nocturnal dyspnea, claudication or edema are absent. No unexplained weight loss, abdominal pain, significant dyspepsia, dysphagia, melena, rectal bleeding, or persistently small caliber stools. Dysuria, pyuria, hematuria, frequency, nocturia or polyuria are denied. Change in hair, skin, nails denied. No bowel changes of constipation or diarrhea. No intolerance to heat or cold. Epistaxis or hemoptysis denied.There is no abnormal bruising , bleeding, or difficulty stopping bleeding with injury.      Objective:   Physical Exam  Pertinent or positive findings include:. He has minor DIP osteoarthritic changes.Hearing aid on R General appearance :adequately nourished; in no distress. Appears younger than stated age  Eyes: No conjunctival inflammation or scleral icterus is present.  Oral exam:  Lips and gums are healthy appearing.There is no oropharyngeal erythema or exudate noted. Dental hygiene is good.  Heart:  Normal rate and regular rhythm. S1 and S2 normal without gallop, murmur, click, rub or  other extra sounds    Lungs:Chest clear to auscultation; no wheezes, rhonchi,rales ,or rubs present.No increased work of breathing.   Abdomen: bowel sounds normal, soft and non-tender without masses, organomegaly or hernias noted.  No guarding or rebound. No flank tenderness to percussion.  Vascular : all pulses equal ; no bruits present.  Skin:Warm & dry.  Intact without suspicious lesions or rashes ; no tenting or jaundice   Lymphatic: No lymphadenopathy is noted about the head, neck, axilla.   Neuro: Strength, tone & DTRs normal.         Assessment & Plan:  See Current Assessment & Plan in Problem List under specific Diagnosis

## 2014-10-21 NOTE — Assessment & Plan Note (Signed)
Lipids, LFTs, TSH ,CK 

## 2014-10-21 NOTE — Progress Notes (Signed)
Pre visit review using our clinic review tool, if applicable. No additional management support is needed unless otherwise documented below in the visit note. 

## 2014-10-21 NOTE — Assessment & Plan Note (Signed)
Labs current. Blood pressure is controlled.

## 2014-10-21 NOTE — Assessment & Plan Note (Signed)
A1c

## 2014-10-21 NOTE — Assessment & Plan Note (Signed)
CBC & dif Iron panel 

## 2014-11-04 ENCOUNTER — Other Ambulatory Visit: Payer: Self-pay | Admitting: Internal Medicine

## 2014-12-27 DIAGNOSIS — Z23 Encounter for immunization: Secondary | ICD-10-CM | POA: Diagnosis not present

## 2015-01-05 ENCOUNTER — Other Ambulatory Visit: Payer: Self-pay | Admitting: Internal Medicine

## 2015-01-06 ENCOUNTER — Other Ambulatory Visit: Payer: Self-pay | Admitting: Emergency Medicine

## 2015-01-06 MED ORDER — PRAVASTATIN SODIUM 40 MG PO TABS: 20.0000 mg | ORAL_TABLET | Freq: Every day | ORAL | Status: DC

## 2015-01-12 ENCOUNTER — Other Ambulatory Visit: Payer: Self-pay | Admitting: Emergency Medicine

## 2015-01-12 ENCOUNTER — Telehealth: Payer: Self-pay | Admitting: Internal Medicine

## 2015-01-12 MED ORDER — AMLODIPINE BESYLATE 5 MG PO TABS: 5.0000 mg | ORAL_TABLET | Freq: Every day | ORAL | Status: DC

## 2015-01-12 NOTE — Telephone Encounter (Signed)
Pt called in and needs refill on his   amLODipine (NORVASC) 5 MG tablet [[id number]]        [facility]

## 2015-01-13 NOTE — Telephone Encounter (Signed)
Refill was sent yesterday by Dr. Linna Darner closing encounter...Johny Chess

## 2015-03-08 ENCOUNTER — Telehealth: Payer: Self-pay | Admitting: Internal Medicine

## 2015-03-08 NOTE — Telephone Encounter (Signed)
Pt called in and said he would like for you to call him when you get a chance?

## 2015-03-10 ENCOUNTER — Telehealth: Payer: Self-pay

## 2015-03-10 NOTE — Telephone Encounter (Signed)
Mr Madray called to inquire of doctor's taking patients p dr. Levell July. Stated Dr. Sharlet Salina, Dr Quay Burow or Terri Piedra. Will outreach Dr. Linna Darner per my chart for further information.  Encouraged to schedule apt for the new doctor to establish care for both he and his wife.

## 2015-03-29 ENCOUNTER — Ambulatory Visit (INDEPENDENT_AMBULATORY_CARE_PROVIDER_SITE_OTHER): Payer: Medicare HMO | Admitting: Family

## 2015-03-29 ENCOUNTER — Encounter: Payer: Self-pay | Admitting: Family

## 2015-03-29 ENCOUNTER — Ambulatory Visit (INDEPENDENT_AMBULATORY_CARE_PROVIDER_SITE_OTHER)
Admission: RE | Admit: 2015-03-29 | Discharge: 2015-03-29 | Disposition: A | Discharge status: Home / Self Care | Payer: Medicare HMO | Source: Ambulatory Visit | Attending: Family | Admitting: Family

## 2015-03-29 VITALS — BP 134/88 | HR 62 | Temp 97.5°F | Resp 18 | Ht 71.0 in | Wt 169.4 lb

## 2015-03-29 DIAGNOSIS — R938 Abnormal findings on diagnostic imaging of other specified body structures: Secondary | ICD-10-CM | POA: Diagnosis not present

## 2015-03-29 DIAGNOSIS — R918 Other nonspecific abnormal finding of lung field: Secondary | ICD-10-CM | POA: Diagnosis not present

## 2015-03-29 DIAGNOSIS — K2100 Gastro-esophageal reflux disease with esophagitis, without bleeding: Secondary | ICD-10-CM

## 2015-03-29 DIAGNOSIS — I10 Essential (primary) hypertension: Secondary | ICD-10-CM | POA: Diagnosis not present

## 2015-03-29 DIAGNOSIS — R9389 Abnormal findings on diagnostic imaging of other specified body structures: Secondary | ICD-10-CM

## 2015-03-29 NOTE — Assessment & Plan Note (Signed)
Hypertension is stable and below goal 140/90 with current regimen. Denies adverse side effects. Continue to monitor blood pressure at home. Continue current dosage of amlodipine and metoprolol. Follow-up in 6 months or sooner if needed.

## 2015-03-29 NOTE — Assessment & Plan Note (Signed)
Stable with current regimen of omeprazole and denies adverse side effects. Continue current dosage of omeprazole. Obtain methylmalonic acid with routine physical. Follow-up if symptoms worsen or no longer controlled with current regimen.

## 2015-03-29 NOTE — Progress Notes (Signed)
Pre visit review using our clinic review tool, if applicable. No additional management support is needed unless otherwise documented below in the visit note. 

## 2015-03-29 NOTE — Progress Notes (Signed)
Subjective:    Patient ID: Ricardo Sloan, male    DOB: 02-17-1932, 82 y.o.   MRN: [id number]  Chief Complaint  Patient presents with  . Establish Care    states that last year it was discovered by hopp that he had a place on his upper right lobe of his lung and he would like it checked out     HPI:  Ricardo Sloan is a 82 y.o. male who  has a past medical history of Hyperlipidemia; Hypertension; BPH (benign prostatic hypertrophy); GERD (gastroesophageal reflux disease); Hemorrhoids; Microscopic hematuria (2001); Pneumonia; Hiatal hernia (2010); Internal hemorrhoids; and Colon polyps. and presents today for a follow up office visit.   1.) Abnormal chest x-ray - Previously noted to have an abnormal chest x-ray that showed COPD changes with questionable with a right upper lobe nodular density. The follow up CT scan from 04/14/13 showed clustered nodular densities in the inferior right upper lobe. The nodular and bronchiectassis in the the right middle lobe lingula. The findings were stable since the previous study. Denies any chest pains, shortness of breath, fevers, or loss of weight.   2.) Hypertension - Currently maintained on metoprolol and amlodipine. Takes the medication as prescribed and denies adverse side effects or hypotension. Blood pressure at home is stable and reported to be well controlled. Denies any symptoms of end organ damage.  BP Readings from Last 3 Encounters:  03/29/15 134/88  10/21/14 126/86  08/31/14 138/72    3.) Omeprazole - Currently maintained on omeprazole. Takes the medication as prescribed and denies adverse side effects. Reports that his symptoms are well controlled with the current medication.   No Known Allergies   Current Outpatient Prescriptions on File Prior to Visit  Medication Sig Dispense Refill  . amLODipine (NORVASC) 5 MG tablet Take 1 tablet (5 mg total) by mouth daily. 90 tablet 2  . Aspirin (ADULT ASPIRIN LOW STRENGTH) 81 MG EC tablet  Take 81 mg by mouth daily.      Marland Kitchen loratadine (CLARITIN) 10 MG tablet Take 10 mg by mouth daily.      . metoprolol succinate (TOPROL XL) 25 MG 24 hr tablet Take 1 tablet (25 mg total) by mouth daily. 90 tablet 3  . Multiple Vitamin (MULTIVITAMIN) tablet Take 1 tablet by mouth daily.      Marland Kitchen omeprazole (PRILOSEC) 40 MG capsule TAKE 1 CAPSULE BY MOUTH EVERY DAY 30 capsule 5  . pravastatin (PRAVACHOL) 40 MG tablet Take 0.5 tablets (20 mg total) by mouth at bedtime. 45 tablet 1   No current facility-administered medications on file prior to visit.     Past Surgical History  Procedure Laterality Date  . Tonsillectomy         . Colonoscopy  [id number]    Internal hemorrhoids, Dr. Velora Heckler  . Nose surgery      Submucosal resection in the 1960s  . Upper gastrointestinal endoscopy  2003    GERD  . Wisdom tooth extraction      Past Medical History  Diagnosis Date  . Hyperlipidemia   . Hypertension   . BPH (benign prostatic hypertrophy)   . GERD (gastroesophageal reflux disease)     Upper endoscopy 2003, Dr. Velora Heckler  . Hemorrhoids   . Microscopic hematuria 2001    Dr.  Luanne Bras  . Pneumonia      X 2 in high school  . Hiatal hernia 2010  . Internal hemorrhoids   . Colon polyps  Review of Systems  Constitutional: Negative for fever, chills, appetite change and unexpected weight change.  Eyes:       Negative for changes in vision  Respiratory: Negative for cough, chest tightness, shortness of breath and wheezing.   Cardiovascular: Negative for chest pain, palpitations and leg swelling.  Gastrointestinal: Negative for nausea, vomiting, abdominal pain, diarrhea, constipation and blood in stool.  Neurological: Negative for headaches.      Objective:    BP 134/88 mmHg  Pulse 62  Temp(Src) 97.5 F (36.4 C) (Oral)  Resp 18  Ht 5\' 11"  (1.803 m)  Wt 169 lb 6.4 oz (76.839 kg)  BMI 23.64 kg/m2  SpO2 97% Nursing note and vital signs reviewed.  Physical Exam    Constitutional: He is oriented to person, place, and time. He appears well-developed and well-nourished. No distress.  Cardiovascular: Normal rate, regular rhythm, normal heart sounds and intact distal pulses.   Pulmonary/Chest: Effort normal and breath sounds normal.  Neurological: He is alert and oriented to person, place, and time.  Skin: Skin is warm and dry.  Psychiatric: He has a normal mood and affect. His behavior is normal. Judgment and thought content normal.       Assessment & Plan:   Problem List Items Addressed This Visit      Cardiovascular and Mediastinum   Essential hypertension    Hypertension is stable and below goal 140/90 with current regimen. Denies adverse side effects. Continue to monitor blood pressure at home. Continue current dosage of amlodipine and metoprolol. Follow-up in 6 months or sooner if needed.        Digestive   GERD    Stable with current regimen of omeprazole and denies adverse side effects. Continue current dosage of omeprazole. Obtain methylmalonic acid with routine physical. Follow-up if symptoms worsen or no longer controlled with current regimen.        Other   Abnormal chest x-ray - Primary    Previously noted to have abnormal chest x-ray with lobe nodular densities noted on CT scan. Obtain chest x-ray for follow-up. Currently stable with no symptoms. Continue to monitor at this time follow-up and additional imaging pending x-ray results as needed.      Relevant Orders   DG Chest 2 View

## 2015-03-29 NOTE — Assessment & Plan Note (Signed)
Previously noted to have abnormal chest x-ray with lobe nodular densities noted on CT scan. Obtain chest x-ray for follow-up. Currently stable with no symptoms. Continue to monitor at this time follow-up and additional imaging pending x-ray results as needed.

## 2015-03-29 NOTE — Patient Instructions (Signed)
Thank you for choosing [facility].  Summary/Instructions:  Please stop by radiology on the basement level of the building for your x-rays. Your results will be released to [place] (or called to you) after review, usually within 72 hours after test completion. If any treatments or changes are necessary, you will be notified at that same time.  If your symptoms worsen or fail to improve, please contact our office for further instruction, or in case of emergency go directly to the emergency room at the closest medical facility.   Please continue to take your medications as prescribed.

## 2015-03-30 ENCOUNTER — Encounter: Payer: Self-pay | Admitting: Family

## 2015-05-03 ENCOUNTER — Other Ambulatory Visit: Payer: Self-pay | Admitting: Internal Medicine

## 2015-05-04 DIAGNOSIS — R69 Illness, unspecified: Secondary | ICD-10-CM | POA: Diagnosis not present

## 2015-06-29 ENCOUNTER — Other Ambulatory Visit: Payer: Self-pay | Admitting: Internal Medicine

## 2015-07-12 DIAGNOSIS — H40013 Open angle with borderline findings, low risk, bilateral: Secondary | ICD-10-CM | POA: Diagnosis not present

## 2015-07-12 DIAGNOSIS — H25813 Combined forms of age-related cataract, bilateral: Secondary | ICD-10-CM | POA: Diagnosis not present

## 2015-07-12 DIAGNOSIS — H524 Presbyopia: Secondary | ICD-10-CM | POA: Diagnosis not present

## 2015-08-24 ENCOUNTER — Other Ambulatory Visit: Payer: Self-pay | Admitting: Family

## 2015-09-22 ENCOUNTER — Other Ambulatory Visit: Payer: Self-pay

## 2015-09-22 MED ORDER — METOPROLOL SUCCINATE ER 25 MG PO TB24: 25.0000 mg | ORAL_TABLET | Freq: Every day | ORAL | Status: DC

## 2015-09-22 NOTE — Telephone Encounter (Signed)
Rx(s) sent to pharmacy electronically.  

## 2015-09-27 ENCOUNTER — Other Ambulatory Visit: Payer: Self-pay | Admitting: Family

## 2015-09-27 ENCOUNTER — Other Ambulatory Visit: Payer: Self-pay | Admitting: Internal Medicine

## 2015-10-03 DIAGNOSIS — D239 Other benign neoplasm of skin, unspecified: Secondary | ICD-10-CM | POA: Diagnosis not present

## 2015-10-03 DIAGNOSIS — L570 Actinic keratosis: Secondary | ICD-10-CM | POA: Diagnosis not present

## 2015-10-03 DIAGNOSIS — L821 Other seborrheic keratosis: Secondary | ICD-10-CM | POA: Diagnosis not present

## 2015-10-03 DIAGNOSIS — L820 Inflamed seborrheic keratosis: Secondary | ICD-10-CM | POA: Diagnosis not present

## 2015-10-03 DIAGNOSIS — C44319 Basal cell carcinoma of skin of other parts of face: Secondary | ICD-10-CM | POA: Diagnosis not present

## 2015-10-03 DIAGNOSIS — D485 Neoplasm of uncertain behavior of skin: Secondary | ICD-10-CM | POA: Diagnosis not present

## 2015-10-03 DIAGNOSIS — C44519 Basal cell carcinoma of skin of other part of trunk: Secondary | ICD-10-CM | POA: Diagnosis not present

## 2015-10-24 ENCOUNTER — Other Ambulatory Visit (INDEPENDENT_AMBULATORY_CARE_PROVIDER_SITE_OTHER): Payer: Medicare HMO

## 2015-10-24 ENCOUNTER — Ambulatory Visit (INDEPENDENT_AMBULATORY_CARE_PROVIDER_SITE_OTHER): Payer: Medicare HMO | Admitting: Family

## 2015-10-24 ENCOUNTER — Encounter: Payer: Self-pay | Admitting: Family

## 2015-10-24 VITALS — BP 110/72 | HR 57 | Temp 97.6°F | Resp 14 | Ht 71.0 in | Wt 166.0 lb

## 2015-10-24 DIAGNOSIS — I10 Essential (primary) hypertension: Secondary | ICD-10-CM | POA: Diagnosis not present

## 2015-10-24 DIAGNOSIS — E785 Hyperlipidemia, unspecified: Secondary | ICD-10-CM | POA: Diagnosis not present

## 2015-10-24 DIAGNOSIS — Z0000 Encounter for general adult medical examination without abnormal findings: Secondary | ICD-10-CM | POA: Diagnosis not present

## 2015-10-24 DIAGNOSIS — Z125 Encounter for screening for malignant neoplasm of prostate: Secondary | ICD-10-CM | POA: Diagnosis not present

## 2015-10-24 DIAGNOSIS — R938 Abnormal findings on diagnostic imaging of other specified body structures: Secondary | ICD-10-CM | POA: Diagnosis not present

## 2015-10-24 DIAGNOSIS — R9389 Abnormal findings on diagnostic imaging of other specified body structures: Secondary | ICD-10-CM

## 2015-10-24 DIAGNOSIS — R918 Other nonspecific abnormal finding of lung field: Secondary | ICD-10-CM | POA: Insufficient documentation

## 2015-10-24 LAB — COMPREHENSIVE METABOLIC PANEL
ALT: 23 U/L (ref 0–53)
AST: 21 U/L (ref 0–37)
Albumin: 4.1 g/dL (ref 3.5–5.2)
Alkaline Phosphatase: 69 U/L (ref 39–117)
BILIRUBIN TOTAL: 0.8 mg/dL (ref 0.2–1.2)
BUN: 22 mg/dL (ref 6–23)
CALCIUM: 9.4 mg/dL (ref 8.4–10.5)
CHLORIDE: 107 meq/L (ref 96–112)
CO2: 28 meq/L (ref 19–32)
CREATININE: 1.13 mg/dL (ref 0.40–1.50)
GFR: 65.98 mL/min (ref >60.00)
GLUCOSE: 96 mg/dL (ref 70–99)
Potassium: 4.5 mEq/L (ref 3.5–5.1)
SODIUM: 142 meq/L (ref 135–145)
Total Protein: 6.4 g/dL (ref 6.0–8.3)

## 2015-10-24 LAB — CBC
HCT: 39 % (ref 39.0–52.0)
Hemoglobin: 13 g/dL (ref 13.0–17.0)
MCHC: 33.4 g/dL (ref 30.0–36.0)
MCV: 92 fl (ref 78.0–100.0)
Platelets: 220 10*3/uL (ref 150.0–400.0)
RBC: 4.24 Mil/uL (ref 4.22–5.81)
RDW: 13.6 % (ref 11.5–15.5)
WBC: 5.2 10*3/uL (ref 4.0–10.5)

## 2015-10-24 LAB — LIPID PANEL
CHOL/HDL RATIO: 3
Cholesterol: 145 mg/dL (ref 0–200)
HDL: 46.6 mg/dL (ref >39.00)
LDL CALC: 84 mg/dL (ref 0–99)
NONHDL: 98.68
TRIGLYCERIDES: 72 mg/dL (ref 0.0–149.0)
VLDL: 14.4 mg/dL (ref 0.0–40.0)

## 2015-10-24 LAB — PSA: PSA: 0.8 ng/mL (ref 0.10–4.00)

## 2015-10-24 NOTE — Patient Instructions (Signed)
Thank you for choosing [facility].  Summary/Instructions:  They will call to schedule your CT scan.   Your prescription(s) have been submitted to your pharmacy or been printed and provided for you. Please take as directed and contact our office if you believe you are having problem(s) with the medication(s) or have any questions.  Please stop by the lab on the lower level of the building for your blood work. Your results will be released to [place] (or called to you) after review, usually within 72 hours after test completion. If any changes need to be made, you will be notified at that same time.  1. The lab is open from 7:30am to 5:30 pm Monday-Friday  2. No appointment is necessary  3. Fasting (if needed) is 6-8 hours after food and drink; black  coffee and water are okay   Health Maintenance  Topic Date Due  . INFLUENZA VACCINE  10/25/2015  . TETANUS/TDAP  06/24/2017  . ZOSTAVAX  Completed  . PNA vac Low Risk Adult  Completed   Health Maintenance, Male A healthy lifestyle and preventative care can promote health and wellness.  Maintain regular health, dental, and eye exams.  Eat a healthy diet. Foods like vegetables, fruits, whole grains, low-fat dairy products, and lean protein foods contain the nutrients you need and are low in calories. Decrease your intake of foods high in solid fats, added sugars, and salt. Get information about a proper diet from your health care provider, if necessary.  Regular physical exercise is one of the most important things you can do for your health. Most adults should get at least 150 minutes of moderate-intensity exercise (any activity that increases your heart rate and causes you to sweat) each week. In addition, most adults need muscle-strengthening exercises on 2 or more days a week.   Maintain a healthy weight. The body mass index (BMI) is a screening tool to identify possible weight problems. It provides an estimate of body fat based on  height and weight. Your health care provider can find your BMI and can help you achieve or maintain a healthy weight. For males 20 years and older:  A BMI below 18.5 is considered underweight.  A BMI of 18.5 to 24.9 is normal.  A BMI of 25 to 29.9 is considered overweight.  A BMI of 30 and above is considered obese.  Maintain normal blood lipids and cholesterol by exercising and minimizing your intake of saturated fat. Eat a balanced diet with plenty of fruits and vegetables. Blood tests for lipids and cholesterol should begin at age 41 and be repeated every 5 years. If your lipid or cholesterol levels are high, you are over age 73, or you are at high risk for heart disease, you may need your cholesterol levels checked more frequently.Ongoing high lipid and cholesterol levels should be treated with medicines if diet and exercise are not working.  If you smoke, find out from your health care provider how to quit. If you do not use tobacco, do not start.  Lung cancer screening is recommended for adults aged 29-80 years who are at high risk for developing lung cancer because of a history of smoking. A yearly low-dose CT scan of the lungs is recommended for people who have at least a 30-pack-year history of smoking and are current smokers or have quit within the past 15 years. A pack year of smoking is smoking an average of 1 pack of cigarettes a day for 1 year (for example,  a 30-pack-year history of smoking could mean smoking 1 pack a day for 30 years or 2 packs a day for 15 years). Yearly screening should continue until the smoker has stopped smoking for at least 15 years. Yearly screening should be stopped for people who develop a health problem that would prevent them from having lung cancer treatment.  If you choose to drink alcohol, do not have more than 2 drinks per day. One drink is considered to be 12 oz (360 mL) of beer, 5 oz (150 mL) of wine, or 1.5 oz (45 mL) of liquor.  Avoid the use of  street drugs. Do not share needles with anyone. Ask for help if you need support or instructions about stopping the use of drugs.  High blood pressure causes heart disease and increases the risk of stroke. High blood pressure is more likely to develop in:  People who have blood pressure in the end of the normal range (100-139/85-89 mm Hg).  People who are overweight or obese.  People who are African American.  If you are 4-71 years of age, have your blood pressure checked every 3-5 years. If you are 32 years of age or older, have your blood pressure checked every year. You should have your blood pressure measured twice--once when you are at a hospital or clinic, and once when you are not at a hospital or clinic. Record the average of the two measurements. To check your blood pressure when you are not at a hospital or clinic, you can use:  An automated blood pressure machine at a pharmacy.  A home blood pressure monitor.  If you are 34-51 years old, ask your health care provider if you should take aspirin to prevent heart disease.  Diabetes screening involves taking a blood sample to check your fasting blood sugar level. This should be done once every 3 years after age 45 if you are at a normal weight and without risk factors for diabetes. Testing should be considered at a younger age or be carried out more frequently if you are overweight and have at least 1 risk factor for diabetes.  Colorectal cancer can be detected and often prevented. Most routine colorectal cancer screening begins at the age of 43 and continues through age 57. However, your health care provider may recommend screening at an earlier age if you have risk factors for colon cancer. On a yearly basis, your health care provider may provide home test kits to check for hidden blood in the stool. A small camera at the end of a tube may be used to directly examine the colon (sigmoidoscopy or colonoscopy) to detect the earliest forms  of colorectal cancer. Talk to your health care provider about this at age 53 when routine screening begins. A direct exam of the colon should be repeated every 5-10 years through age 92, unless early forms of precancerous polyps or small growths are found.  People who are at an increased risk for hepatitis B should be screened for this virus. You are considered at high risk for hepatitis B if:  You were born in a country where hepatitis B occurs often. Talk with your health care provider about which countries are considered high risk.  Your parents were born in a high-risk country and you have not received a shot to protect against hepatitis B (hepatitis B vaccine).  You have HIV or AIDS.  You use needles to inject street drugs.  You live with, or have sex with, someone  who has hepatitis B.  You are a man who has sex with other men (MSM).  You get hemodialysis treatment.  You take certain medicines for conditions like cancer, organ transplantation, and autoimmune conditions.  Hepatitis C blood testing is recommended for all people born from [birth year] through 1963 and any individual with known risk factors for hepatitis C.  Healthy men should no longer receive prostate-specific antigen (PSA) blood tests as part of routine cancer screening. Talk to your health care provider about prostate cancer screening.  Testicular cancer screening is not recommended for adolescents or adult males who have no symptoms. Screening includes self-exam, a health care provider exam, and other screening tests. Consult with your health care provider about any symptoms you have or any concerns you have about testicular cancer.  Practice safe sex. Use condoms and avoid high-risk sexual practices to reduce the spread of sexually transmitted infections (STIs).  You should be screened for STIs, including gonorrhea and chlamydia if:  You are sexually active and are younger than 24 years.  You are older than 24 years,  and your health care provider tells you that you are at risk for this type of infection.  Your sexual activity has changed since you were last screened, and you are at an increased risk for chlamydia or gonorrhea. Ask your health care provider if you are at risk.  If you are at risk of being infected with HIV, it is recommended that you take a prescription medicine daily to prevent HIV infection. This is called pre-exposure prophylaxis (PrEP). You are considered at risk if:  You are a man who has sex with other men (MSM).  You are a heterosexual man who is sexually active with multiple partners.  You take drugs by injection.  You are sexually active with a partner who has HIV.  Talk with your health care provider about whether you are at high risk of being infected with HIV. If you choose to begin PrEP, you should first be tested for HIV. You should then be tested every 3 months for as long as you are taking PrEP.  Use sunscreen. Apply sunscreen liberally and repeatedly throughout the day. You should seek shade when your shadow is shorter than you. Protect yourself by wearing long sleeves, pants, a wide-brimmed hat, and sunglasses year round whenever you are outdoors.  Tell your health care provider of new moles or changes in moles, especially if there is a change in shape or color. Also, tell your health care provider if a mole is larger than the size of a pencil eraser.  A one-time screening for abdominal aortic aneurysm (AAA) and surgical repair of large AAAs by ultrasound is recommended for men aged 74-75 years who are current or former smokers.  Stay current with your vaccines (immunizations).   This information is not intended to replace advice given to you by your health care provider. Make sure you discuss any questions you have with your health care provider.   Document Released: 09/08/2007 Document Revised: 04/02/2014 Document Reviewed: 08/07/2010 Elsevier Interactive Patient  Education [employer].

## 2015-10-24 NOTE — Progress Notes (Signed)
Subjective:    Patient ID: Ricardo Sloan, male    DOB: 11/25/1931, 82 y.o.   MRN: [id number]  Chief Complaint  Patient presents with  . CPE    fasting    HPI:  Ricardo Sloan is a 82 y.o. male who presents today for an annual wellness visit.   1) Health Maintenance -   Diet - Averages about 3 meals per day consisting of a regular diet; Occasional processed/fast food; Caffeine intake of about 2-3 cups per day.  Exercise - walking around the house and taking care of his wife with RA  2) Preventative Exams / Immunizations:  Dental -- Up to date   Vision -- Up to date.    Health Maintenance  Topic Date Due  . INFLUENZA VACCINE  10/25/2015  . TETANUS/TDAP  06/24/2017  . ZOSTAVAX  Completed  . PNA vac Low Risk Adult  Completed     Immunization History  Administered Date(s) Administered  . Influenza Split 12/21/2011  . Influenza Whole 01/21/2007  . Influenza,inj,Quad PF,36+ Mos 12/19/2012, 12/28/2013  . Influenza-Unspecified 12/27/2014  . Pneumococcal Conjugate-13 07/22/2014  . Pneumococcal Polysaccharide-23 03/14/2004, 10/03/2009  . Td 03/27/1995, 07/23/2005  . Tdap 06/25/2007  . Zoster 01/30/2007    RISK FACTORS  Tobacco History  Smoking Status  . Former Smoker  . Quit date: 03/26/1965  Smokeless Tobacco  . Never Used    Comment: smoked 1953-1968, up to 1 ppd     Cardiac risk factors: dyslipidemia and hypertension.  Depression Screen  Q1: Over the past two weeks, have you felt down, depressed or hopeless? No  Q2: Over the past two weeks, have you felt little interest or pleasure in doing things? No  Have you lost interest or pleasure in daily life? No  Do you often feel hopeless? No  Do you cry easily over simple problems? No  Depression screen PHQ 2/9 10/24/2015  Decreased Interest 0  Down, Depressed, Hopeless 0  PHQ - 2 Score 0   Activities of Daily Living In your present state of health, do you have any difficulty performing the  following activities?:  Driving? No Managing money?  No Feeding yourself? No Getting from bed to chair? No Climbing a flight of stairs? No Preparing food and eating?: No Bathing or showering? No Getting dressed: No Getting to the toilet? No Using the toilet: No Moving around from place to place: No In the past year have you fallen or had a near fall?:No   Home Safety Has smoke detector and wears seat belts. No excess sun exposure. Are there smokers in your home (other than you)?  No Do you feel safe at home?  Yes  Hearing Difficulties: No Do you often ask people to speak up or repeat themselves? No Do you experience ringing or noises in your ears? No  Do you have difficulty understanding soft or whispered voices? No    Cognitive Testing  Alert? Yes   Normal Appearance? Yes  Oriented to person? Yes  Place? Yes   Time? Yes  Recall of three objects?  Yes  Can perform simple calculations? Yes  Displays appropriate judgment? Yes  Can read the correct time from a watch face? Yes  Do you feel that you have a problem with memory? No  Do you often misplace items? No   Advanced Directives have been discussed with the patient? Yes  Has living will and POA  Current Physicians/Providers and Suppliers  1. Terri Piedra, FNP - Internal  Medicine 2. Dr. Bing Plume - Opthalmology 3. Darlin Coco, MD - Cardiology  Indicate any recent Medical Services you may have received from other than [facility] providers in the past year (date may be approximate).  All answers were reviewed with the patient and necessary referrals were made:  Mauricio Po, [place]   10/24/2015    No Known Allergies   Outpatient Medications Prior to Visit  Medication Sig Dispense Refill  . amLODipine (NORVASC) 5 MG tablet TAKE 1 TABLET(5 MG) BY MOUTH DAILY 90 tablet 1  . Aspirin (ADULT ASPIRIN LOW STRENGTH) 81 MG EC tablet Take 81 mg by mouth daily.      Marland Kitchen loratadine (CLARITIN) 10 MG tablet Take 10 mg by mouth daily.       . metoprolol succinate (TOPROL XL) 25 MG 24 hr tablet Take 1 tablet (25 mg total) by mouth daily. 90 tablet 1  . Multiple Vitamin (MULTIVITAMIN) tablet Take 1 tablet by mouth daily.      Marland Kitchen omeprazole (PRILOSEC) 40 MG capsule TAKE 1 CAPSULE BY MOUTH EVERY DAY 30 capsule 0  . pravastatin (PRAVACHOL) 40 MG tablet TAKE 1/2 TABLET(20 MG) BY MOUTH AT BEDTIME 45 tablet 1   No facility-administered medications prior to visit.      Past Medical History:  Diagnosis Date  . BPH (benign prostatic hypertrophy)   . Colon polyps   . GERD (gastroesophageal reflux disease)    Upper endoscopy 2003, Dr. Velora Heckler  . Hemorrhoids   . Hiatal hernia 2010  . Hyperlipidemia   . Hypertension   . Internal hemorrhoids   . Microscopic hematuria 2001   Dr.  Luanne Bras  . Pneumonia     X 2 in high school     Past Surgical History:  Procedure Laterality Date  . COLONOSCOPY  [id number]   Internal hemorrhoids, Dr. Velora Heckler  . NOSE SURGERY     Submucosal resection in the 1960s  . TONSILLECTOMY        . UPPER GASTROINTESTINAL ENDOSCOPY  2003   GERD  . WISDOM TOOTH EXTRACTION       Family History  Problem Relation Age of Onset  . Diabetes Mother   . Subarachnoid hemorrhage Daughter   . Lung cancer Brother     smoker  . Heart disease Neg Hx   . Stroke Neg Hx   . Hypertension Neg Hx   . Colon cancer Neg Hx      Social History   Social History  . Marital status: Married    Spouse name: N/A  . Number of children: N/A  . Years of education: N/A   Occupational History  . Not on file.   Social History Main Topics  . Smoking status: Former Smoker    Quit date: 03/26/1965  . Smokeless tobacco: Never Used     Comment: smoked 1953-1968, up to 1 ppd  . Alcohol use Yes     Comment:  rarely socially; 1-2 / month  . Drug use: No  . Sexual activity: Not on file   Other Topics Concern  . Not on file   Social History Narrative   Daily caffeine    Daily care of wife; becoming more  disabled; but still cooks;    Stress 1-10; 2        Review of Systems  Constitutional: Denies fever, chills, fatigue, or significant weight gain/loss. HENT: Head: Denies headache or neck pain Ears: Denies changes in hearing, ringing in ears, earache, drainage Nose: Denies discharge, stuffiness, itching, nosebleed,  sinus pain Throat: Denies sore throat, hoarseness, dry mouth, sores, thrush Eyes: Denies loss/changes in vision, pain, redness, blurry/double vision, flashing lights Cardiovascular: Denies chest pain/discomfort, tightness, palpitations, shortness of breath with activity, difficulty lying down, swelling, sudden awakening with shortness of breath Respiratory: Denies shortness of breath, cough, sputum production, wheezing Gastrointestinal: Denies dysphasia, heartburn, change in appetite, nausea, change in bowel habits, rectal bleeding, constipation, diarrhea, yellow skin or eyes Genitourinary: Denies frequency, urgency, burning/pain, blood in urine, incontinence, change in urinary strength. Musculoskeletal: Denies muscle/joint pain, stiffness, back pain, redness or swelling of joints, trauma Skin: Denies rashes, lumps, itching, dryness, color changes, or hair/nail changes Neurological: Denies dizziness, fainting, seizures, weakness, numbness, tingling, tremor Psychiatric - Denies nervousness, stress, depression or memory loss Endocrine: Denies heat or cold intolerance, sweating, frequent urination, excessive thirst, changes in appetite Hematologic: Denies ease of bruising or bleeding    Objective:     BP 110/72 (BP Location: Left Arm, Patient Position: Sitting, Cuff Size: Normal)   Pulse (!) 57   Temp 97.6 F (36.4 C) (Oral)   Resp 14   Ht 5\' 11"  (1.803 m)   Wt 166 lb (75.3 kg)   SpO2 98%   BMI 23.15 kg/m  Nursing note and vital signs reviewed.  Physical Exam  Constitutional: He is oriented to person, place, and time. He appears well-developed and well-nourished.    HENT:  Head: Normocephalic.  Right Ear: Hearing, tympanic membrane, external ear and ear canal normal.  Left Ear: Hearing, tympanic membrane, external ear and ear canal normal.  Nose: Nose normal.  Mouth/Throat: Uvula is midline, oropharynx is clear and moist and mucous membranes are normal.  Eyes: Conjunctivae and EOM are normal. Pupils are equal, round, and reactive to light.  Neck: Neck supple. No JVD present. No tracheal deviation present. No thyromegaly present.  Cardiovascular: Normal rate, regular rhythm, normal heart sounds and intact distal pulses.   Pulmonary/Chest: Effort normal and breath sounds normal.  Abdominal: Soft. Bowel sounds are normal. He exhibits no distension and no mass. There is no tenderness. There is no rebound and no guarding.  Musculoskeletal: Normal range of motion. He exhibits no edema or tenderness.  Lymphadenopathy:    He has no cervical adenopathy.  Neurological: He is alert and oriented to person, place, and time. He has normal reflexes. No cranial nerve deficit. He exhibits normal muscle tone. Coordination normal.  Skin: Skin is warm and dry.  Psychiatric: He has a normal mood and affect. His behavior is normal. Judgment and thought content normal.       Assessment & Plan:   During the course of the visit the patient was educated and counseled about appropriate screening and preventive services including:    Pneumococcal vaccine   Influenza vaccine  Td vaccine  Prostate cancer screening  Colorectal cancer screening  Glaucoma screening  Nutrition counseling   Diet review for nutrition referral? Yes ____  Not Indicated _X___   Patient Instructions (the written plan) was given to the patient.  Medicare Attestation I have personally reviewed: The patient's medical and social history Their use of alcohol, tobacco or illicit drugs Their current medications and supplements The patient's functional ability including ADLs,fall risks, home  safety risks, cognitive, and hearing and visual impairment Diet and physical activities Evidence for depression or mood disorders  The patient's weight, height, BMI,  have been recorded in the chart.  I have made referrals, counseling, and provided education to the patient based on review of the above and I  have provided the patient with a written personalized care plan for preventive services.     Mauricio Po, [place]   10/24/2015    Problem List Items Addressed This Visit      Cardiovascular and Mediastinum   Essential hypertension    Blood pressure below goal 140/90 with current regimen and no adverse side effects. Continue current dosage of amlodipine and metoprolol. Denies worse headache of life or symptoms of end organ damage.        Other   Hyperlipidemia    Hyperlipidemia appears stable and well controlled with current regimen of pravastatin. Continue current dosage of pravastatin pending lipid profile results.      Abnormal chest x-ray - Primary   Routine general medical examination at a health care facility    1) Anticipatory Guidance: Discussed importance of wearing a seatbelt while driving and not texting while driving; changing batteries in smoke detector at least once annually; wearing suntan lotion when outside; eating a balanced and moderate diet; getting physical activity at least 30 minutes per day.  2) Immunizations / Screenings / Labs:  All immunizations are up-to-date per recommendations. Obtain PSA for prostate cancer screening. All other screenings are up-to-date per recommendations. Obtain CBC, CMET, and Lipid profile.  Overall well exam with risk factors for cardiovascular disease including hypertension and hyperlipidemia. Chronic conditions appear well maintained through medication regimen with no adverse side effects. Continue healthy lifestyle behaviors and choices. Follow-up prevention exam in 1 year. Follow-up office visit pending blood work as necessary.       Relevant Orders   CBC (Completed)   Comprehensive metabolic panel (Completed)   Lipid panel (Completed)   PSA (Completed)   Medicare annual wellness visit, subsequent    Reviewed and updated patient's medical, surgical, family and social history. Medications and allergies were also reviewed. Basic screenings for depression, activities of daily living, hearing, cognition and safety were performed. Provider list was updated and health plan was provided to the patient.       Lung nodules    Lung nodules previously stable with recommendation for CT scan. Obtain CT to check current nodule status.       Relevant Orders   CT CHEST WO CONTRAST    Other Visit Diagnoses   None.

## 2015-10-24 NOTE — Assessment & Plan Note (Signed)
Lung nodules previously stable with recommendation for CT scan. Obtain CT to check current nodule status.

## 2015-10-24 NOTE — Assessment & Plan Note (Addendum)
1) Anticipatory Guidance: Discussed importance of wearing a seatbelt while driving and not texting while driving; changing batteries in smoke detector at least once annually; wearing suntan lotion when outside; eating a balanced and moderate diet; getting physical activity at least 30 minutes per day.  2) Immunizations / Screenings / Labs:  All immunizations are up-to-date per recommendations. Obtain PSA for prostate cancer screening. All other screenings are up-to-date per recommendations. Obtain CBC, CMET, and Lipid profile.  Overall well exam with risk factors for cardiovascular disease including hypertension and hyperlipidemia. Chronic conditions appear well maintained through medication regimen with no adverse side effects. Continue healthy lifestyle behaviors and choices. Follow-up prevention exam in 1 year. Follow-up office visit pending blood work as necessary.

## 2015-10-24 NOTE — Assessment & Plan Note (Signed)
Blood pressure below goal 140/90 with current regimen and no adverse side effects. Continue current dosage of amlodipine and metoprolol. Denies worse headache of life or symptoms of end organ damage.

## 2015-10-24 NOTE — Assessment & Plan Note (Signed)
Reviewed and updated patient's medical, surgical, family and social history. Medications and allergies were also reviewed. Basic screenings for depression, activities of daily living, hearing, cognition and safety were performed. Provider list was updated and health plan was provided to the patient.  

## 2015-10-24 NOTE — Assessment & Plan Note (Signed)
Hyperlipidemia appears stable and well controlled with current regimen of pravastatin. Continue current dosage of pravastatin pending lipid profile results.

## 2015-11-02 ENCOUNTER — Encounter: Payer: Self-pay | Admitting: Internal Medicine

## 2015-11-02 ENCOUNTER — Other Ambulatory Visit: Payer: Self-pay | Admitting: Family

## 2015-11-02 ENCOUNTER — Ambulatory Visit (INDEPENDENT_AMBULATORY_CARE_PROVIDER_SITE_OTHER): Payer: Medicare HMO | Admitting: Internal Medicine

## 2015-11-02 VITALS — BP 152/75 | HR 51 | Ht 71.0 in | Wt 168.6 lb

## 2015-11-02 DIAGNOSIS — I10 Essential (primary) hypertension: Secondary | ICD-10-CM | POA: Diagnosis not present

## 2015-11-02 DIAGNOSIS — R002 Palpitations: Secondary | ICD-10-CM

## 2015-11-02 DIAGNOSIS — E785 Hyperlipidemia, unspecified: Secondary | ICD-10-CM | POA: Diagnosis not present

## 2015-11-02 MED ORDER — AMLODIPINE BESYLATE 5 MG PO TABS: 5.0000 mg | ORAL_TABLET | Freq: Every day | ORAL | 3 refills | Status: DC

## 2015-11-02 MED ORDER — METOPROLOL SUCCINATE ER 25 MG PO TB24: 25.0000 mg | ORAL_TABLET | Freq: Every day | ORAL | 3 refills | Status: DC

## 2015-11-02 NOTE — Patient Instructions (Signed)
Your physician recommends that you continue on your current medications as directed. Please refer to the Current Medication list given to you today.  Your physician wants you to follow-up in: 1 year with Dr. Debara Pickett. You will receive a reminder letter in the mail two months in advance. If you don't receive a letter, please call our office to schedule the follow-up appointment.

## 2015-11-02 NOTE — Progress Notes (Signed)
OFFICE NOTE  Chief Complaint:  Establish cardiologist  Primary Care Physician: Ricardo Po, FNP  HPI:  Ricardo Sloan is a 82 y.o. male he was a former patient of Dr. Mare Sloan. In 2016 he had a stress test which the treadmill performed by his primary care provider for an abnormal EKG. He does have history of hypertension and dyslipidemia. The treadmill stress test was abnormal and ultimately was referred for a Myoview. The Myoview was negative for ischemia. He was started on low-dose beta blocker which has kept his heart rate and blood pressure down. Blood pressure today was initially elevated but recheck came down to 130/78. EKG shows a sinus bradycardia at 51. He denies any chest pain or worsening shortness of breath. He says he has to take a nap almost every day but otherwise feels pretty energized. Unfortunately he is caring for his wife who has crippling rheumatoid arthritis.  PMHx:  Past Medical History:  Diagnosis Date  . BPH (benign prostatic hypertrophy)   . Colon polyps   . GERD (gastroesophageal reflux disease)    Upper endoscopy 2003, Dr. Velora Sloan  . Hemorrhoids   . Hiatal hernia 2010  . Hyperlipidemia   . Hypertension   . Internal hemorrhoids   . Microscopic hematuria 2001   Dr.  Luanne Sloan  . Pneumonia     X 2 in high school    Past Surgical History:  Procedure Laterality Date  . COLONOSCOPY  [id number]   Internal hemorrhoids, Dr. Velora Sloan  . NOSE SURGERY     Submucosal resection in the 1960s  . TONSILLECTOMY        . UPPER GASTROINTESTINAL ENDOSCOPY  2003   GERD  . WISDOM TOOTH EXTRACTION      FAMHx:  Family History  Problem Relation Age of Onset  . Diabetes Mother   . Subarachnoid hemorrhage Daughter   . Lung cancer Brother     smoker  . Heart disease Neg Hx   . Stroke Neg Hx   . Hypertension Neg Hx   . Colon cancer Neg Hx     SOCHx:   reports that he quit smoking about 50 years ago. He has never used smokeless tobacco. He  reports that he drinks alcohol. He reports that he does not use drugs.  ALLERGIES:  No Known Allergies  ROS: Pertinent items noted in HPI and remainder of comprehensive ROS otherwise negative.  HOME MEDS: Current Outpatient Prescriptions  Medication Sig Dispense Refill  . amLODipine (NORVASC) 5 MG tablet TAKE 1 TABLET(5 MG) BY MOUTH DAILY 90 tablet 1  . Aspirin (ADULT ASPIRIN LOW STRENGTH) 81 MG EC tablet Take 81 mg by mouth daily.      Marland Kitchen loratadine (CLARITIN) 10 MG tablet Take 10 mg by mouth daily.      . metoprolol succinate (TOPROL XL) 25 MG 24 hr tablet Take 1 tablet (25 mg total) by mouth daily. 90 tablet 1  . Multiple Vitamin (MULTIVITAMIN) tablet Take 1 tablet by mouth daily.      Marland Kitchen omeprazole (PRILOSEC) 40 MG capsule TAKE 1 CAPSULE BY MOUTH EVERY DAY 30 capsule 0  . pravastatin (PRAVACHOL) 40 MG tablet TAKE 1/2 TABLET(20 MG) BY MOUTH AT BEDTIME 45 tablet 1   No current facility-administered medications for this visit.     LABS/IMAGING: No results found for this or any previous visit (from the past 48 hour(s)). No results found.  WEIGHTS: Wt Readings from Last 3 Encounters:  11/02/15 168 lb 9.6 oz (76.5 kg)  10/24/15 166 lb (75.3 kg)  03/29/15 169 lb 6.4 oz (76.8 kg)    VITALS: BP (!) 152/75   Pulse (!) 51   Ht 5\' 11"  (1.803 m)   Wt 168 lb 9.6 oz (76.5 kg)   BMI 23.51 kg/m   EXAM: General appearance: alert and no distress Neck: no carotid bruit and no JVD Lungs: clear to auscultation bilaterally Heart: regular rate and rhythm, S1, S2 normal, no murmur, click, rub or gallop Abdomen: soft, non-tender; bowel sounds normal; no masses,  no organomegaly Extremities: extremities normal, atraumatic, no cyanosis or edema Pulses: 2+ and symmetric Skin: Skin color, texture, turgor normal. No rashes or lesions Neurologic: Grossly normal Psych: Pleasant  EKG: Sinus bradycardia 51  ASSESSMENT: 1. Hypertension-controlled 2. Dyslipidemia 3. History of  palpitations-improved  PLAN: 1.   Mr. Sloan has adequate blood pressure control. His recent lipid profile is at goal. He denies any recurrent palpitations. We will refill his medications today. Follow-up with me on an annual basis or sooner as necessary.  Pixie Casino, MD, [facility] Attending Cardiologist [place] 11/02/2015, 11:10 AM

## 2015-11-03 ENCOUNTER — Encounter: Payer: Self-pay | Admitting: Family

## 2015-11-03 DIAGNOSIS — R69 Illness, unspecified: Secondary | ICD-10-CM | POA: Diagnosis not present

## 2015-11-14 ENCOUNTER — Encounter: Payer: Self-pay | Admitting: Family

## 2015-11-14 ENCOUNTER — Ambulatory Visit
Admission: RE | Admit: 2015-11-14 | Discharge: 2015-11-14 | Disposition: A | Discharge status: Home / Self Care | Payer: Medicare HMO | Source: Ambulatory Visit | Attending: Family | Admitting: Family

## 2015-11-14 DIAGNOSIS — R918 Other nonspecific abnormal finding of lung field: Secondary | ICD-10-CM | POA: Diagnosis not present

## 2015-11-21 DIAGNOSIS — C44519 Basal cell carcinoma of skin of other part of trunk: Secondary | ICD-10-CM | POA: Diagnosis not present

## 2015-11-21 DIAGNOSIS — C44319 Basal cell carcinoma of skin of other parts of face: Secondary | ICD-10-CM | POA: Diagnosis not present

## 2015-12-03 ENCOUNTER — Other Ambulatory Visit: Payer: Self-pay | Admitting: Family

## 2015-12-13 ENCOUNTER — Ambulatory Visit (INDEPENDENT_AMBULATORY_CARE_PROVIDER_SITE_OTHER): Payer: Medicare HMO

## 2015-12-13 DIAGNOSIS — Z23 Encounter for immunization: Secondary | ICD-10-CM | POA: Diagnosis not present

## 2016-01-01 ENCOUNTER — Other Ambulatory Visit: Payer: Self-pay | Admitting: Family

## 2016-03-15 ENCOUNTER — Other Ambulatory Visit: Payer: Self-pay

## 2016-03-15 DIAGNOSIS — I10 Essential (primary) hypertension: Secondary | ICD-10-CM

## 2016-03-15 MED ORDER — METOPROLOL SUCCINATE ER 25 MG PO TB24
25.0000 mg | ORAL_TABLET | Freq: Every day | ORAL | 3 refills | Status: DC
Start: 2016-03-15 — End: 2016-11-14

## 2016-03-23 ENCOUNTER — Other Ambulatory Visit: Payer: Self-pay | Admitting: Family

## 2016-04-04 ENCOUNTER — Telehealth: Payer: Self-pay | Admitting: *Deleted

## 2016-04-04 MED ORDER — OMEPRAZOLE 40 MG PO CPDR: 40.0000 mg | DELAYED_RELEASE_CAPSULE | Freq: Every day | ORAL | 0 refills | Status: DC

## 2016-04-04 NOTE — Telephone Encounter (Signed)
Rec'd call pt states his insurance carrier has change. Needing prescriptions filled at [facility] on [address] now. Inform pt updated pharmacy...Johny Chess

## 2016-04-30 ENCOUNTER — Other Ambulatory Visit: Payer: Self-pay | Admitting: *Deleted

## 2016-04-30 MED ORDER — OMEPRAZOLE 40 MG PO CPDR: 40.0000 mg | DELAYED_RELEASE_CAPSULE | Freq: Every day | ORAL | 1 refills | Status: DC

## 2016-05-16 DIAGNOSIS — R69 Illness, unspecified: Secondary | ICD-10-CM | POA: Diagnosis not present

## 2016-07-17 DIAGNOSIS — H524 Presbyopia: Secondary | ICD-10-CM | POA: Diagnosis not present

## 2016-07-17 DIAGNOSIS — H40013 Open angle with borderline findings, low risk, bilateral: Secondary | ICD-10-CM | POA: Diagnosis not present

## 2016-07-17 DIAGNOSIS — H25813 Combined forms of age-related cataract, bilateral: Secondary | ICD-10-CM | POA: Diagnosis not present

## 2016-10-24 ENCOUNTER — Other Ambulatory Visit (INDEPENDENT_AMBULATORY_CARE_PROVIDER_SITE_OTHER): Payer: Medicare HMO

## 2016-10-24 ENCOUNTER — Ambulatory Visit (INDEPENDENT_AMBULATORY_CARE_PROVIDER_SITE_OTHER): Payer: Medicare HMO | Admitting: Family

## 2016-10-24 ENCOUNTER — Encounter: Payer: Self-pay | Admitting: Family

## 2016-10-24 VITALS — BP 126/82 | HR 60 | Temp 97.8°F | Resp 16 | Ht 71.0 in | Wt 161.0 lb

## 2016-10-24 DIAGNOSIS — Z125 Encounter for screening for malignant neoplasm of prostate: Secondary | ICD-10-CM

## 2016-10-24 DIAGNOSIS — Z0000 Encounter for general adult medical examination without abnormal findings: Secondary | ICD-10-CM

## 2016-10-24 DIAGNOSIS — I10 Essential (primary) hypertension: Secondary | ICD-10-CM

## 2016-10-24 DIAGNOSIS — E782 Mixed hyperlipidemia: Secondary | ICD-10-CM

## 2016-10-24 LAB — CBC
HEMATOCRIT: 38.3 % — AB (ref 39.0–52.0)
HEMOGLOBIN: 12.8 g/dL — AB (ref 13.0–17.0)
MCHC: 33.4 g/dL (ref 30.0–36.0)
MCV: 92.9 fl (ref 78.0–100.0)
Platelets: 202 10*3/uL (ref 150.0–400.0)
RBC: 4.12 Mil/uL — ABNORMAL LOW (ref 4.22–5.81)
RDW: 13.4 % (ref 11.5–15.5)
WBC: 5.5 10*3/uL (ref 4.0–10.5)

## 2016-10-24 LAB — COMPREHENSIVE METABOLIC PANEL
ALT: 20 U/L (ref 0–53)
AST: 21 U/L (ref 0–37)
Albumin: 4.1 g/dL (ref 3.5–5.2)
Alkaline Phosphatase: 58 U/L (ref 39–117)
BUN: 29 mg/dL — AB (ref 6–23)
CHLORIDE: 104 meq/L (ref 96–112)
CO2: 28 meq/L (ref 19–32)
Calcium: 9.3 mg/dL (ref 8.4–10.5)
Creatinine, Ser: 1.33 mg/dL (ref 0.40–1.50)
GFR: 54.53 mL/min — ABNORMAL LOW (ref >60.00)
GLUCOSE: 94 mg/dL (ref 70–99)
POTASSIUM: 4.2 meq/L (ref 3.5–5.1)
SODIUM: 139 meq/L (ref 135–145)
TOTAL PROTEIN: 6.1 g/dL (ref 6.0–8.3)
Total Bilirubin: 0.8 mg/dL (ref 0.2–1.2)

## 2016-10-24 LAB — LIPID PANEL
CHOLESTEROL: 153 mg/dL (ref 0–200)
HDL: 45.2 mg/dL (ref >39.00)
LDL Cholesterol: 93 mg/dL (ref 0–99)
NONHDL: 107.73
Total CHOL/HDL Ratio: 3
Triglycerides: 74 mg/dL (ref 0.0–149.0)
VLDL: 14.8 mg/dL (ref 0.0–40.0)

## 2016-10-24 LAB — PSA: PSA: 1.01 ng/mL (ref 0.10–4.00)

## 2016-10-24 NOTE — Assessment & Plan Note (Signed)
Appears stable and maintained on pravastatin with no adverse side effects or myalgias. Obtain lipid profile. Continue current dosage of pravastatin pending lipid profile results.

## 2016-10-24 NOTE — Assessment & Plan Note (Signed)
Reviewed and updated patient's medical, surgical, family and social history. Medications and allergies were also reviewed. Basic screenings for depression, activities of daily living, hearing, cognition and safety were performed. Provider list was updated and health plan was provided to the patient.  

## 2016-10-24 NOTE — Patient Instructions (Signed)
Thank you for choosing [facility].  SUMMARY AND INSTRUCTIONS:  Good to see you!  You appear to be doing very well.   Continue to take your medications as prescribed.  We will check your blood work today.  Labs:  Please stop by the lab on the lower level of the building for your blood work. Your results will be released to [facility] (or called to you) after review, usually within 72 hours after test completion. If any changes need to be made, you will be notified at that same time.  1.) The lab is open from 7:30am to 5:30 pm Monday-Friday 2.) No appointment is necessary 3.) Fasting (if needed) is 6-8 hours after food and drink; black coffee and water are okay   Follow up:  If your symptoms worsen or fail to improve, please contact our office for further instruction, or in case of emergency go directly to the emergency room at the closest medical facility.    Health Maintenance  Topic Date Due  . INFLUENZA VACCINE  10/24/2016  . TETANUS/TDAP  06/24/2017  . PNA vac Low Risk Adult  Completed    Health Maintenance, Male A healthy lifestyle and preventive care is important for your health and wellness. Ask your health care provider about what schedule of regular examinations is right for you. What should I know about weight and diet? Eat a Healthy Diet  Eat plenty of vegetables, fruits, whole grains, low-fat dairy products, and lean protein.  Do not eat a lot of foods high in solid fats, added sugars, or salt.  Maintain a Healthy Weight Regular exercise can help you achieve or maintain a healthy weight. You should:  Do at least 150 minutes of exercise each week. The exercise should increase your heart rate and make you sweat (moderate-intensity exercise).  Do strength-training exercises at least twice a week.  Watch Your Levels of Cholesterol and Blood Lipids  Have your blood tested for lipids and cholesterol every 5 years starting at 81 years of age. If you are at  high risk for heart disease, you should start having your blood tested when you are 81 years old. You may need to have your cholesterol levels checked more often if: ? Your lipid or cholesterol levels are high. ? You are older than 81 years of age. ? You are at high risk for heart disease.  What should I know about cancer screening? Many types of cancers can be detected early and may often be prevented. Lung Cancer  You should be screened every year for lung cancer if: ? You are a current smoker who has smoked for at least 30 years. ? You are a former smoker who has quit within the past 15 years.  Talk to your health care provider about your screening options, when you should start screening, and how often you should be screened.  Colorectal Cancer  Routine colorectal cancer screening usually begins at 81 years of age and should be repeated every 5-10 years until you are 81 years old. You may need to be screened more often if early forms of precancerous polyps or small growths are found. Your health care provider may recommend screening at an earlier age if you have risk factors for colon cancer.  Your health care provider may recommend using home test kits to check for hidden blood in the stool.  A small camera at the end of a tube can be used to examine your colon (sigmoidoscopy or colonoscopy). This checks for  the earliest forms of colorectal cancer.  Prostate and Testicular Cancer  Depending on your age and overall health, your health care provider may do certain tests to screen for prostate and testicular cancer.  Talk to your health care provider about any symptoms or concerns you have about testicular or prostate cancer.  Skin Cancer  Check your skin from head to toe regularly.  Tell your health care provider about any new moles or changes in moles, especially if: ? There is a change in a mole's size, shape, or color. ? You have a mole that is larger than a pencil  eraser.  Always use sunscreen. Apply sunscreen liberally and repeat throughout the day.  Protect yourself by wearing long sleeves, pants, a wide-brimmed hat, and sunglasses when outside.  What should I know about heart disease, diabetes, and high blood pressure?  If you are 80-26 years of age, have your blood pressure checked every 3-5 years. If you are 62 years of age or older, have your blood pressure checked every year. You should have your blood pressure measured twice-once when you are at a hospital or clinic, and once when you are not at a hospital or clinic. Record the average of the two measurements. To check your blood pressure when you are not at a hospital or clinic, you can use: ? An automated blood pressure machine at a pharmacy. ? A home blood pressure monitor.  Talk to your health care provider about your target blood pressure.  If you are between 55-57 years old, ask your health care provider if you should take aspirin to prevent heart disease.  Have regular diabetes screenings by checking your fasting blood sugar level. ? If you are at a normal weight and have a low risk for diabetes, have this test once every three years after the age of 11. ? If you are overweight and have a high risk for diabetes, consider being tested at a younger age or more often.  A one-time screening for abdominal aortic aneurysm (AAA) by ultrasound is recommended for men aged 41-75 years who are current or former smokers. What should I know about preventing infection? Hepatitis B If you have a higher risk for hepatitis B, you should be screened for this virus. Talk with your health care provider to find out if you are at risk for hepatitis B infection. Hepatitis C Blood testing is recommended for:  Everyone born from [birth year] through 1963.  Anyone with known risk factors for hepatitis C.  Sexually Transmitted Diseases (STDs)  You should be screened each year for STDs including gonorrhea and  chlamydia if: ? You are sexually active and are younger than 81 years of age. ? You are older than 81 years of age and your health care provider tells you that you are at risk for this type of infection. ? Your sexual activity has changed since you were last screened and you are at an increased risk for chlamydia or gonorrhea. Ask your health care provider if you are at risk.  Talk with your health care provider about whether you are at high risk of being infected with HIV. Your health care provider may recommend a prescription medicine to help prevent HIV infection.  What else can I do?  Schedule regular health, dental, and eye exams.  Stay current with your vaccines (immunizations).  Do not use any tobacco products, such as cigarettes, chewing tobacco, and e-cigarettes. If you need help quitting, ask your health care provider.  Limit  alcohol intake to no more than 2 drinks per day. One drink equals 12 ounces of beer, 5 ounces of wine, or 1 ounces of hard liquor.  Do not use street drugs.  Do not share needles.  Ask your health care provider for help if you need support or information about quitting drugs.  Tell your health care provider if you often feel depressed.  Tell your health care provider if you have ever been abused or do not feel safe at home. This information is not intended to replace advice given to you by your health care provider. Make sure you discuss any questions you have with your health care provider. Document Released: 09/08/2007 Document Revised: 11/09/2015 Document Reviewed: 12/14/2014 Elsevier Interactive Patient Education  2018 [place] [facility] Ask your health care provider which exercises are safe for you. Do exercises exactly as told by your health care provider and adjust them as directed. It is normal to feel mild stretching, pulling, tightness, or discomfort as you do these exercises, but you should stop right away if you feel  sudden pain or your pain gets worse. Do not begin these exercises until told by your health care provider. Stretching and range of motion exercises These exercises warm up your muscles and joints and improve the movement and flexibility of your back. These exercises also help to relieve pain, numbness, and tingling. Exercise A: Single knee to chest  Lie on your back on a firm surface with both legs straight. Bend one of your knees. Use your hands to move your knee up toward your chest until you feel a gentle stretch in your lower back and buttock. Hold your leg in this position by holding onto the front of your knee. Keep your other leg as straight as possible. Hold for __________ seconds. Slowly return to the starting position. Repeat with your other leg. Repeat __________ times. Complete this exercise __________ times a day. Exercise B: Prone extension on elbows  Lie on your abdomen on a firm surface. Prop yourself up on your elbows. Use your arms to help lift your chest up until you feel a gentle stretch in your abdomen and your lower back. This will place some of your body weight on your elbows. If this is uncomfortable, try stacking pillows under your chest. Your hips should stay down, against the surface that you are lying on. Keep your hip and back muscles relaxed. Hold for __________ seconds. Slowly relax your upper body and return to the starting position. Repeat __________ times. Complete this exercise __________ times a day. Strengthening exercises These exercises build strength and endurance in your back. Endurance is the ability to use your muscles for a long time, even after they get tired. Exercise C: Pelvic tilt Lie on your back on a firm surface. Bend your knees and keep your feet flat. Tense your abdominal muscles. Tip your pelvis up toward the ceiling and flatten your lower back into the floor. To help with this exercise, you may place a small towel under your lower  back and try to push your back into the towel. Hold for __________ seconds. Let your muscles relax completely before you repeat this exercise. Repeat __________ times. Complete this exercise __________ times a day. Exercise D: Alternating arm and leg raises  Get on your hands and knees on a firm surface. If you are on a hard floor, you may want to use padding to cushion your knees, such as an exercise mat.  Line up your arms and legs. Your hands should be below your shoulders, and your knees should be below your hips. Lift your left leg behind you. At the same time, raise your right arm and straighten it in front of you. Do not lift your leg higher than your hip. Do not lift your arm higher than your shoulder. Keep your abdominal and back muscles tight. Keep your hips facing the ground. Do not arch your back. Keep your balance carefully, and do not hold your breath. Hold for __________ seconds. Slowly return to the starting position and repeat with your right leg and your left arm. Repeat __________ times. Complete this exercise __________times a day. Exercise J: Single leg lower with bent knees Lie on your back on a firm surface. Tense your abdominal muscles and lift your feet off the floor, one foot at a time, so your knees and hips are bent in an "L" shape (at about 90 degrees). Your knees should be over your hips and your lower legs should be parallel to the floor. Keeping your abdominal muscles tense and your knee bent, slowly lower one of your legs so your toe touches the ground. Lift your leg back up to return to the starting position. Do not hold your breath. Do not let your back arch. Keep your back flat against the ground. Repeat with your other leg. Repeat __________ times. Complete this exercise __________ times a day. Posture and body mechanics  Body mechanics refers to the movements and positions of your body while you do your daily activities. Posture is part of body  mechanics. Good posture and healthy body mechanics can help to relieve stress in your body's tissues and joints. Good posture means that your spine is in its natural S-curve position (your spine is neutral), your shoulders are pulled back slightly, and your head is not tipped forward. The following are general guidelines for applying improved posture and body mechanics to your everyday activities. Standing  When standing, keep your spine neutral and your feet about hip-width apart. Keep a slight bend in your knees. Your ears, shoulders, and hips should line up. When you do a task in which you stand in one place for a long time, place one foot up on a stable object that is 2-4 inches (5-10 cm) high, such as a footstool. This helps keep your spine neutral. Sitting  When sitting, keep your spine neutral and keep your feet flat on the floor. Use a footrest, if necessary, and keep your thighs parallel to the floor. Avoid rounding your shoulders, and avoid tilting your head forward. When working at a desk or a computer, keep your desk at a height where your hands are slightly lower than your elbows. Slide your chair under your desk so you are close enough to maintain good posture. When working at a computer, place your monitor at a height where you are looking straight ahead and you do not have to tilt your head forward or downward to look at the screen. Resting  When lying down and resting, avoid positions that are most painful for you. If you have pain with activities such as sitting, bending, stooping, or squatting (flexion-based activities), lie in a position in which your body does not bend very much. For example, avoid curling up on your side with your arms and knees near your chest (fetal position). If you have pain with activities such as standing for a long time or reaching with your arms (extension-based activities), lie  with your spine in a neutral position and bend your knees slightly. Try the  following positions: Lying on your side with a pillow between your knees. Lying on your back with a pillow under your knees. Lifting  When lifting objects, keep your feet at least shoulder-width apart and tighten your abdominal muscles. Bend your knees and hips and keep your spine neutral. It is important to lift using the strength of your legs, not your back. Do not lock your knees straight out. Always ask for help to lift heavy or awkward objects. This information is not intended to replace advice given to you by your health care provider. Make sure you discuss any questions you have with your health care provider. Document Released: 03/12/2005 Document Revised: 11/17/2015 Document Reviewed: 12/22/2014 Elsevier Interactive Patient Education  [employer].

## 2016-10-24 NOTE — Assessment & Plan Note (Signed)
Blood pressure well-controlled and below goal 140/90 with current medication regimen and no adverse side effects. Encouraged to monitor blood pressure at home and follow low-sodium diet. Continue current dosage of metoprolol and amlodipine.

## 2016-10-24 NOTE — Progress Notes (Signed)
Subjective:    Patient ID: Ricardo Sloan, male    DOB: [birth year], 83 y.o.   MRN: [id number]  Chief Complaint  Patient presents with  . CPE    Fasting    HPI:  Ricardo Sloan is a 83 y.o. male who presents today for a Medicare Annual Wellness/Physical exam.    1) Health Maintenance -   Diet - Averaging about 3 meals per day consisting of a regular diet; Caffeine intake of 2-3 cups daily.   Exercise -  House work, yard work; Technical sales engineer on occasion   2) Publishing rights manager / Immunizations:  Dental -- Up to date  Vision -- Up to date.    Health Maintenance  Topic Date Due  . INFLUENZA VACCINE  10/24/2016  . TETANUS/TDAP  06/24/2017  . PNA vac Low Risk Adult  Completed     Immunization History  Administered Date(s) Administered  . Influenza Split 12/21/2011  . Influenza Whole 01/21/2007  . Influenza, High Dose Seasonal PF 12/13/2015  . Influenza,inj,Quad PF,36+ Mos 12/19/2012, 12/28/2013  . Influenza-Unspecified 12/27/2014  . Pneumococcal Conjugate-13 07/22/2014  . Pneumococcal Polysaccharide-23 03/14/2004, 10/03/2009  . Td 03/27/1995, 07/23/2005  . Tdap 06/25/2007  . Zoster 01/30/2007    RISK FACTORS  Tobacco History  Smoking Status  . Former Smoker  . Quit date: 03/26/1965  Smokeless Tobacco  . Never Used    Comment: smoked 1953-1968, up to 1 ppd     Cardiac risk factors: advanced age (older than 5 for men, 93 for women), dyslipidemia, hypertension and male gender.  Depression Screen  Depression screen [facility] 2/9 10/24/2016  Decreased Interest 0  Down, Depressed, Hopeless 0  PHQ - 2 Score 0     Activities of Daily Living In your present state of health, do you have any difficulty performing the following activities?:  Driving? No Managing money?  No Feeding yourself? No Getting from bed to chair? No Climbing a flight of stairs? No Preparing food and eating?: No Bathing or showering? No Getting dressed: No Getting to the toilet?  No Using the toilet: No Moving around from place to place: No In the past year have you fallen or had a near fall?:No   Home Safety Has smoke detector and wears seat belts. No excess sun exposure. Are there smokers in your home (other than you)?  No Do you feel safe at home?  Yes  Hearing Difficulties: No Do you often ask people to speak up or repeat themselves? No Do you experience ringing or noises in your ears? No  Do you have difficulty understanding soft or whispered voices? No    Cognitive Testing  Alert? Yes   Normal Appearance? Yes  Oriented to person? Yes  Place? Yes   Time? Yes  Recall of three objects?  Yes  Can perform simple calculations? Yes  Displays appropriate judgment? Yes  Can read the correct time from a watch face? Yes  Do you feel that you have a problem with memory? No  Do you often misplace items? No   Advanced Directives have been discussed with the patient? Yes   Current Physicians/Providers and Suppliers  1. Terri Piedra, FNP - Internal Medicine 2. Lyman Bishop, MD - Cardiology   Indicate any recent Medical Services you may have received from other than [facility] providers in the past year (date may be approximate).  All answers were reviewed with the patient and necessary referrals were made:  Mauricio Po, [place]   10/24/2016  No Known Allergies   Outpatient Medications Prior to Visit  Medication Sig Dispense Refill  . amLODipine (NORVASC) 5 MG tablet Take 1 tablet (5 mg total) by mouth daily. 90 tablet 3  . Aspirin (ADULT ASPIRIN LOW STRENGTH) 81 MG EC tablet Take 81 mg by mouth daily.      Marland Kitchen loratadine (CLARITIN) 10 MG tablet Take 10 mg by mouth daily.      . metoprolol succinate (TOPROL XL) 25 MG 24 hr tablet Take 1 tablet (25 mg total) by mouth daily. 90 tablet 3  . Multiple Vitamin (MULTIVITAMIN) tablet Take 1 tablet by mouth daily.      Marland Kitchen omeprazole (PRILOSEC) 40 MG capsule Take 1 capsule (40 mg total) by mouth daily. Yearly physical  due in July must see Marya Amsler for future refills 90 capsule 1  . pravastatin (PRAVACHOL) 40 MG tablet TAKE 1/2 TABLETS BY MOUTH EVERY NIGHT AT BEDTIME 45 tablet 2   No facility-administered medications prior to visit.      Past Medical History:  Diagnosis Date  . BPH (benign prostatic hypertrophy)   . Colon polyps   . GERD (gastroesophageal reflux disease)    Upper endoscopy 2003, Dr. Velora Heckler  . Hemorrhoids   . Hiatal hernia 2010  . Hyperlipidemia   . Hypertension   . Internal hemorrhoids   . Microscopic hematuria 2001   Dr.  Luanne Bras  . Pneumonia     X 2 in high school     Past Surgical History:  Procedure Laterality Date  . COLONOSCOPY  [id number]   Internal hemorrhoids, Dr. Velora Heckler  . NOSE SURGERY     Submucosal resection in the 1960s  . TONSILLECTOMY        . UPPER GASTROINTESTINAL ENDOSCOPY  2003   GERD  . WISDOM TOOTH EXTRACTION       Family History  Problem Relation Age of Onset  . Diabetes Mother   . Subarachnoid hemorrhage Daughter   . Lung cancer Brother        smoker  . Other Paternal Grandfather        typhoid  . Heart disease Neg Hx   . Stroke Neg Hx   . Hypertension Neg Hx   . Colon cancer Neg Hx      Social History   Social History  . Marital status: Married    Spouse name: N/A  . Number of children: N/A  . Years of education: N/A   Occupational History  . Not on file.   Social History Main Topics  . Smoking status: Former Smoker    Quit date: 03/26/1965  . Smokeless tobacco: Never Used     Comment: smoked 1953-1968, up to 1 ppd  . Alcohol use Yes     Comment:  rarely socially; 1-2 / month  . Drug use: No  . Sexual activity: Not on file   Other Topics Concern  . Not on file   Social History Narrative   Daily caffeine    Daily care of wife; becoming more disabled; but still cooks;    Stress 1-10; 2      epworth sleepiness scale = 10 (11/02/2015)   Fun/[place]: Garden   Denies abuse and feels safe at home.      Review  of Systems  Constitutional: Denies fever, chills, fatigue, or significant weight gain/loss. HENT: Head: Denies headache or neck pain Ears: Denies changes in hearing, ringing in ears, earache, drainage Nose: Denies discharge, stuffiness, itching, nosebleed, sinus pain Throat: Denies  sore throat, hoarseness, dry mouth, sores, thrush Eyes: Denies loss/changes in vision, pain, redness, blurry/double vision, flashing lights Cardiovascular: Denies chest pain/discomfort, tightness, palpitations, shortness of breath with activity, difficulty lying down, swelling, sudden awakening with shortness of breath Respiratory: Denies shortness of breath, cough, sputum production, wheezing Gastrointestinal: Denies dysphasia, heartburn, change in appetite, nausea, change in bowel habits, rectal bleeding, constipation, diarrhea, yellow skin or eyes Genitourinary: Denies frequency, urgency, burning/pain, blood in urine, incontinence, change in urinary strength. Musculoskeletal: Denies muscle/joint pain, stiffness, back pain, redness or swelling of joints, trauma Skin: Denies rashes, lumps, itching, dryness, color changes, or hair/nail changes Neurological: Denies dizziness, fainting, seizures, weakness, numbness, tingling, tremor Psychiatric - Denies nervousness, stress, depression or memory loss Endocrine: Denies heat or cold intolerance, sweating, frequent urination, excessive thirst, changes in appetite Hematologic: Denies ease of bruising or bleeding    Objective:     BP 126/82 (BP Location: Left Arm, Patient Position: Sitting, Cuff Size: Normal)   Pulse 60   Temp 97.8 F (36.6 C) (Oral)   Resp 16   Ht 5\' 11"  (1.803 m)   Wt 161 lb (73 kg)   SpO2 97%   BMI 22.45 kg/m  Nursing note and vital signs reviewed.  Physical Exam  Constitutional: He is oriented to person, place, and time. He appears well-developed and well-nourished.  HENT:  Head: Normocephalic.  Right Ear: Hearing, tympanic membrane,  external ear and ear canal normal.  Left Ear: Hearing, tympanic membrane, external ear and ear canal normal.  Nose: Nose normal.  Mouth/Throat: Uvula is midline, oropharynx is clear and moist and mucous membranes are normal.  Eyes: Pupils are equal, round, and reactive to light. Conjunctivae and EOM are normal.  Neck: Neck supple. No JVD present. No tracheal deviation present. No thyromegaly present.  Cardiovascular: Normal rate, regular rhythm, normal heart sounds and intact distal pulses.   Pulmonary/Chest: Effort normal and breath sounds normal.  Abdominal: Soft. Bowel sounds are normal. He exhibits no distension and no mass. There is no tenderness. There is no rebound and no guarding.  Musculoskeletal: Normal range of motion. He exhibits no edema or tenderness.  Lymphadenopathy:    He has no cervical adenopathy.  Neurological: He is alert and oriented to person, place, and time. He has normal reflexes. No cranial nerve deficit. He exhibits normal muscle tone. Coordination normal.  Skin: Skin is warm and dry.  Psychiatric: He has a normal mood and affect. His behavior is normal. Judgment and thought content normal.       Assessment & Plan:   During the course of the visit the patient was educated and counseled about appropriate screening and preventive services including:    Pneumococcal vaccine   Influenza vaccine  Prostate cancer screening  Diabetes screening  Glaucoma screening  Nutrition counseling   Diet review for nutrition referral? Yes ____  Not Indicated _X___   Patient Instructions (the written plan) was given to the patient.  Medicare Attestation I have personally reviewed: The patient's medical and social history Their use of alcohol, tobacco or illicit drugs Their current medications and supplements The patient's functional ability including ADLs,fall risks, home safety risks, cognitive, and hearing and visual impairment Diet and physical  activities Evidence for depression or mood disorders  The patient's weight, height, BMI,  have been recorded in the chart.  I have made referrals, counseling, and provided education to the patient based on review of the above and I have provided the patient with a written personalized care plan  for preventive services.     Problem List Items Addressed This Visit      Cardiovascular and Mediastinum   Essential hypertension    Blood pressure well-controlled and below goal 140/90 with current medication regimen and no adverse side effects. Encouraged to monitor blood pressure at home and follow low-sodium diet. Continue current dosage of metoprolol and amlodipine.        Other   Hyperlipidemia    Appears stable and maintained on pravastatin with no adverse side effects or myalgias. Obtain lipid profile. Continue current dosage of pravastatin pending lipid profile results.      Routine general medical examination at a health care facility    1) Anticipatory Guidance: Discussed importance of wearing a seatbelt while driving and not texting while driving; changing batteries in smoke detector at least once annually; wearing suntan lotion when outside; eating a balanced and moderate diet; getting physical activity at least 30 minutes per day.  2) Immunizations / Screenings / Labs:  All immunizations are up-to-date per recommendations. Obtain PSA for prostate cancer screening. All other screenings are up-to-date per recommendations or has aged out. Obtain CBC, CMET, and lipid profile.    Overall well exam with risk factors for cardiovascular disease including hypertension and hyperlipidemia. Chronic conditions appear adequately managed with no significant adverse side effects. 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 Does have some mild back pain on occasion. Exercises provided and after visit summary for  back strengthening. Continue healthy lifestyle behaviors and choices. Follow-up prevention exam in 1 year. Follow-up office visit for chronic conditions and pending blood work as needed.       Relevant Orders   PSA (Completed)   Comprehensive metabolic panel (Completed)   CBC (Completed)   Lipid panel (Completed)   Medicare annual wellness visit, subsequent - Primary    Reviewed and updated patient's medical, surgical, family and social history. Medications and allergies were also reviewed. Basic screenings for depression, activities of daily living, hearing, cognition and safety were performed. Provider list was updated and health plan was provided to the patient.            I am having Ricardo Sloan maintain his Aspirin, loratadine, multivitamin, amLODipine, metoprolol succinate, pravastatin, and omeprazole.   Follow-up: Return in about 6 months (around 04/26/2017), or if symptoms worsen or fail to improve.   Mauricio Po, FNP

## 2016-10-24 NOTE — Assessment & Plan Note (Signed)
1) Anticipatory Guidance: Discussed importance of wearing a seatbelt while driving and not texting while driving; changing batteries in smoke detector at least once annually; wearing suntan lotion when outside; eating a balanced and moderate diet; getting physical activity at least 30 minutes per day.  2) Immunizations / Screenings / Labs:  All immunizations are up-to-date per recommendations. Obtain PSA for prostate cancer screening. All other screenings are up-to-date per recommendations or has aged out. Obtain CBC, CMET, and lipid profile.    Overall well exam with risk factors for cardiovascular disease including hypertension and hyperlipidemia. Chronic conditions appear adequately managed with no significant adverse side effects. 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 Does have some mild back pain on occasion. Exercises provided and after visit summary for back strengthening. Continue healthy lifestyle behaviors and choices. Follow-up prevention exam in 1 year. Follow-up office visit for chronic conditions and pending blood work as needed.

## 2016-10-25 ENCOUNTER — Encounter: Payer: Self-pay | Admitting: Family

## 2016-11-05 ENCOUNTER — Other Ambulatory Visit: Payer: Self-pay | Admitting: Family

## 2016-11-14 ENCOUNTER — Encounter: Payer: Self-pay | Admitting: Internal Medicine

## 2016-11-14 ENCOUNTER — Ambulatory Visit (INDEPENDENT_AMBULATORY_CARE_PROVIDER_SITE_OTHER): Payer: Medicare HMO | Admitting: Internal Medicine

## 2016-11-14 VITALS — BP 130/70 | HR 58 | Ht 71.0 in | Wt 162.0 lb

## 2016-11-14 DIAGNOSIS — E782 Mixed hyperlipidemia: Secondary | ICD-10-CM | POA: Diagnosis not present

## 2016-11-14 DIAGNOSIS — I10 Essential (primary) hypertension: Secondary | ICD-10-CM | POA: Diagnosis not present

## 2016-11-14 DIAGNOSIS — R002 Palpitations: Secondary | ICD-10-CM

## 2016-11-14 MED ORDER — AMLODIPINE BESYLATE 5 MG PO TABS: 5.0000 mg | ORAL_TABLET | Freq: Every day | ORAL | 3 refills | Status: DC

## 2016-11-14 MED ORDER — METOPROLOL SUCCINATE ER 25 MG PO TB24: 25.0000 mg | ORAL_TABLET | Freq: Every day | ORAL | 3 refills | Status: DC

## 2016-11-14 NOTE — Progress Notes (Signed)
OFFICE NOTE  Chief Complaint:  No complaints  Primary Care Physician: Ricardo Circle, FNP  HPI:  Ricardo Sloan is a 83 y.o. male he was a former patient of Dr. Mare Sloan. In 2016 he had a stress test which the treadmill performed by his primary care provider for an abnormal EKG. He does have history of hypertension and dyslipidemia. The treadmill stress test was abnormal and ultimately was referred for a Myoview. The Myoview was negative for ischemia. He was started on low-dose beta blocker which has kept his heart rate and blood pressure down. Blood pressure today was initially elevated but recheck came down to 130/78. EKG shows a sinus bradycardia at 51. He denies any chest pain or worsening shortness of breath. He says he has to take a nap almost every day but otherwise feels pretty energized. Unfortunately he is caring for his wife who has crippling rheumatoid arthritis.  11/14/2016  Ricardo Sloan was seen today in follow-up. Overall is doing well. 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 He is asymptomatic and denies any shortness of breath or chest pain. He notes his heart rate can increase up into the 70s or 80s with exertion. Recently had lab work including cholesterol which was a total cholesterol 153, trigs 74, HDL 45 and LDL 93. He reports some new prominent veins in his lower extremities which is concerned about. He also gets some occasional pain in his left hip which shoots down the left leg.  PMHx:  Past Medical History:  Diagnosis Date  . BPH (benign prostatic hypertrophy)   . Colon polyps   . GERD (gastroesophageal reflux disease)    Upper endoscopy 2003, Dr. Velora Heckler  . Hemorrhoids   . Hiatal hernia 2010  . Hyperlipidemia   . Hypertension   . Internal hemorrhoids   . Microscopic hematuria 2001   Dr.  Luanne Bras  . Pneumonia     X 2 in high school    Past Surgical History:  Procedure Laterality Date  .  COLONOSCOPY  [id number]   Internal hemorrhoids, Dr. Velora Heckler  . NOSE SURGERY     Submucosal resection in the 1960s  . TONSILLECTOMY        . UPPER GASTROINTESTINAL ENDOSCOPY  2003   GERD  . WISDOM TOOTH EXTRACTION      FAMHx:  Family History  Problem Relation Age of Onset  . Diabetes Mother   . Subarachnoid hemorrhage Daughter   . Lung cancer Brother        smoker  . Other Paternal Grandfather        typhoid  . Heart disease Neg Hx   . Stroke Neg Hx   . Hypertension Neg Hx   . Colon cancer Neg Hx     SOCHx:   reports that he quit smoking about 51 years ago. He has never used smokeless tobacco. He reports that he drinks alcohol. He reports that he does not use drugs.  ALLERGIES:  No Known Allergies  ROS: Pertinent items noted in HPI and remainder of comprehensive ROS otherwise negative.  HOME MEDS: Current Outpatient Prescriptions  Medication Sig Dispense Refill  . amLODipine (NORVASC) 5 MG tablet Take 1 tablet (5 mg total) by mouth daily. 90 tablet 3  . Aspirin (ADULT ASPIRIN LOW STRENGTH) 81 MG EC tablet Take 81 mg by mouth daily.      Marland Kitchen loratadine (CLARITIN) 10 MG tablet Take 10 mg by mouth  daily.      . metoprolol succinate (TOPROL XL) 25 MG 24 hr tablet Take 1 tablet (25 mg total) by mouth daily. 90 tablet 3  . Multiple Vitamin (MULTIVITAMIN) tablet Take 1 tablet by mouth daily.      Marland Kitchen omeprazole (PRILOSEC) 40 MG capsule TAKE ONE CAPSULE BY MOUTH DAILY 90 capsule 0  . pravastatin (PRAVACHOL) 40 MG tablet TAKE 1/2 TABLETS BY MOUTH EVERY NIGHT AT BEDTIME 45 tablet 2   No current facility-administered medications for this visit.     LABS/IMAGING: No results found for this or any previous visit (from the past 48 hour(s)). No results found.  WEIGHTS: Wt Readings from Last 3 Encounters:  11/14/16 162 lb (73.5 kg)  10/24/16 161 lb (73 kg)  11/02/15 168 lb 9.6 oz (76.5 kg)    VITALS: BP 130/70   Pulse (!) 58   Ht 5\' 11"  (1.803 m)   Wt 162 lb (73.5 kg)   BMI  22.59 kg/m   EXAM: General appearance: alert and no distress Neck: no carotid bruit and no JVD Lungs: clear to auscultation bilaterally Heart: regular rate and rhythm, S1, S2 normal, no murmur, click, rub or gallop Abdomen: soft, non-tender; bowel sounds normal; no masses,  no organomegaly Extremities: extremities normal, atraumatic, no cyanosis or edema Pulses: 2+ and symmetric Skin: Skin color, texture, turgor normal. No rashes or lesions Neurologic: Grossly normal Psych: Pleasant  EKG: Sinus bradycardia 58-first therapy   ASSESSMENT: 1. Hypertension-controlled 2. Dyslipidemia 3. History of palpitations-improved  PLAN: 1.   Ricardo Sloan is doing very well. He is asymptomatic. Blood pressure is well-controlled. He denies any new palpitations. Cholesterol is at goal. We will refill his medications today. Follow-up with me on an annual basis or sooner as necessary.  Pixie Casino, MD, [facility] Attending Cardiologist [place] 11/14/2016, 3:13 PM

## 2016-11-14 NOTE — Patient Instructions (Signed)
Your physician wants you to follow-up in: ONE YEAR with Dr. Hilty. You will receive a reminder letter in the mail two months in advance. If you don't receive a letter, please call our office to schedule the follow-up appointment.  

## 2016-11-15 DIAGNOSIS — R69 Illness, unspecified: Secondary | ICD-10-CM | POA: Diagnosis not present

## 2016-12-24 ENCOUNTER — Other Ambulatory Visit: Payer: Self-pay | Admitting: Family

## 2016-12-25 ENCOUNTER — Ambulatory Visit: Payer: Medicare HMO

## 2016-12-26 ENCOUNTER — Ambulatory Visit (INDEPENDENT_AMBULATORY_CARE_PROVIDER_SITE_OTHER): Payer: Medicare HMO

## 2016-12-26 DIAGNOSIS — Z23 Encounter for immunization: Secondary | ICD-10-CM

## 2017-02-11 ENCOUNTER — Other Ambulatory Visit: Payer: Self-pay | Admitting: Family

## 2017-02-19 ENCOUNTER — Telehealth: Payer: Self-pay | Admitting: Family

## 2017-02-19 NOTE — Telephone Encounter (Signed)
Per chart electronic refill came through on 02/03/17 rx was denied

## 2017-02-19 NOTE — Telephone Encounter (Signed)
PLs contact pt to make estab appt w/new provider, and then we can send enough refills in until appt...Ricardo Sloan

## 2017-02-19 NOTE — Telephone Encounter (Signed)
Forward to Lucy

## 2017-02-19 NOTE — Telephone Encounter (Signed)
LVM for patient to call back and est care with a new provider, Mauricio Po is no longer with out office.  The only one we have available here is Ricardo Sloan and she is booked out until [place], once the appt is made we can send in enough refills to get him through to his appt.

## 2017-02-19 NOTE — Telephone Encounter (Signed)
Copied from [place]. Topic: Quick Communication - Rx Refill/Question >> Feb 19, 2017  9:53 AM Cecelia Byars, NT wrote: Has the patient contacted their pharmacy? yes Agent: If no, request that the patient contact the pharmacy for the refill.) Preferred Pharmacy (with phone number or street name): [facility] on [address], [phone]  Agent: Please be advised that RX refills may take up to 48 hours. We ask that you follow-up with your pharmacy. Prescription is for omeprazole 40 mg

## 2017-02-20 ENCOUNTER — Other Ambulatory Visit: Payer: Self-pay | Admitting: Family

## 2017-02-21 NOTE — Telephone Encounter (Signed)
Please advise,  Appt made by  [facility] to transfer from [facility] to [place], it was not approved,  Can patient transfer from [place] to [place],  His wife is a patient of yours,  Also the patient needs a refill of his Omeprazole, he is currently out,

## 2017-02-21 NOTE — Telephone Encounter (Signed)
I will accept him - please make appt.  Ok to refill omeprazole with refills. Marland Kitchen

## 2017-02-22 ENCOUNTER — Telehealth: Payer: Self-pay | Admitting: Family

## 2017-02-22 MED ORDER — OMEPRAZOLE 40 MG PO CPDR: 40.0000 mg | DELAYED_RELEASE_CAPSULE | Freq: Every day | ORAL | 6 refills | Status: DC

## 2017-02-22 NOTE — Telephone Encounter (Signed)
LVM informing patient of approval and of med refills,

## 2017-02-22 NOTE — Telephone Encounter (Signed)
Copied from [place] [phone]. Topic: Quick Communication - See Telephone Encounter >> Feb 22, 2017  9:40 AM Corie Chiquito, NT wrote: CRM for notification. See Telephone encounter for: Patient needs a refill on his Omeprazole 40mg . He has been out of this medication for over a week.Ricardo Sloan is a former patient of Dr.Coalone If someone could give him a call back  02/22/17.

## 2017-02-22 NOTE — Telephone Encounter (Signed)
OK to fill per Dr Quay Burow (omeprazole)

## 2017-03-18 ENCOUNTER — Other Ambulatory Visit: Payer: Self-pay | Admitting: Family

## 2017-03-25 ENCOUNTER — Telehealth: Payer: Self-pay | Admitting: Internal Medicine

## 2017-03-25 ENCOUNTER — Other Ambulatory Visit: Payer: Self-pay | Admitting: *Deleted

## 2017-03-25 MED ORDER — PRAVASTATIN SODIUM 40 MG PO TABS: ORAL_TABLET | ORAL | 0 refills | Status: DC

## 2017-03-25 NOTE — Telephone Encounter (Signed)
Per office policy sent 30 day to local pharmacy until appt.../lmb  

## 2017-03-25 NOTE — Telephone Encounter (Signed)
Pt is requesting a refill on the pravastatin. His next office visit is with Dr. Quay Burow on 03/27/17.

## 2017-03-26 NOTE — Progress Notes (Signed)
Subjective:    Patient ID: Ricardo Sloan, male    DOB: 1932/01/01, 84 y.o.   MRN: [id number]  HPI  He is here to establish with a new pcp.   The patient is here for follow up.  Hypertension: He is taking his medication daily. He is compliant with a low sodium diet.  He denies chest pain, palpitations, edema, shortness of breath and regular headaches. He is exercising regularly.  He does not monitor his blood pressure at home.    Hyperlipidemia: He is taking his medication daily. He is compliant with a low fat/cholesterol diet. He is exercising regularly. He denies myalgias.   Prediabetes:  He is compliant with a low sugar/carbohydrate diet.  He is exercising regularly.  GERD:  He is taking his medication daily as prescribed.  He denies any GERD symptoms and feels his GERD is well controlled.   Abnormal Ct scan of lungs:   He had history of PNA as a teenager and developed scar tissue in the right lung. His last Ct of his lungs was 10/2015.  He has occasional wheeze.  He denies sob, cough, fever.    Medications and allergies reviewed with patient and updated if appropriate.  Patient Active Problem List   Diagnosis Date Noted  . Lung nodules 10/24/2015  . Prediabetes 10/21/2014  . Anemia 10/21/2014  . Abnormal stress test 08/26/2014  . Heart palpitations 08/26/2014  . Abnormal chest x-ray 10/15/2012  . GERD 10/15/2008  . DYSPHAGIA 10/15/2008  . ERECTILE DYSFUNCTION 07/09/2007  . Mixed hyperlipidemia 07/15/2006  . Essential hypertension 07/15/2006    Current Outpatient Medications on File Prior to Visit  Medication Sig Dispense Refill  . amLODipine (NORVASC) 5 MG tablet Take 1 tablet (5 mg total) by mouth daily. 90 tablet 3  . Aspirin (ADULT ASPIRIN LOW STRENGTH) 81 MG EC tablet Take 81 mg by mouth daily.      Marland Kitchen loratadine (CLARITIN) 10 MG tablet Take 10 mg by mouth daily.      . metoprolol succinate (TOPROL XL) 25 MG 24 hr tablet Take 1 tablet (25 mg total) by mouth daily.  90 tablet 3  . Multiple Vitamin (MULTIVITAMIN) tablet Take 1 tablet by mouth daily.      Marland Kitchen omeprazole (PRILOSEC) 40 MG capsule Take 1 capsule (40 mg total) by mouth daily. 90 capsule 6   No current facility-administered medications on file prior to visit.     Past Medical History:  Diagnosis Date  . BPH (benign prostatic hypertrophy)   . Colon polyps   . GERD (gastroesophageal reflux disease)    Upper endoscopy 2003, Dr. Velora Heckler  . Hemorrhoids   . Hiatal hernia 2010  . Hyperlipidemia   . Hypertension   . Internal hemorrhoids   . Microscopic hematuria 2001   Dr.  Luanne Bras  . Pneumonia     X 2 in high school    Past Surgical History:  Procedure Laterality Date  . COLONOSCOPY  [id number]   Internal hemorrhoids, Dr. Velora Heckler  . NOSE SURGERY     Submucosal resection in the 1960s  . TONSILLECTOMY        . UPPER GASTROINTESTINAL ENDOSCOPY  2003   GERD  . WISDOM TOOTH EXTRACTION      Social History   Socioeconomic History  . Marital status: Married    Spouse name: Not on file  . Number of children: Not on file  . Years of education: Not on file  . Highest education level:  Not on file  Social Needs  . Financial resource strain: Not on file  . Food insecurity - worry: Not on file  . Food insecurity - inability: Not on file  . Transportation needs - medical: Not on file  . Transportation needs - non-medical: Not on file  Occupational History  . Not on file  Tobacco Use  . Smoking status: Former Smoker    Last attempt to quit: 03/26/1965    Years since quitting: 52.0  . Smokeless tobacco: Never Used  . Tobacco comment: smoked 1953-1968, up to 1 ppd  Substance and Sexual Activity  . Alcohol use: Yes    Comment:  rarely socially; 1-2 / month  . Drug use: No  . Sexual activity: Not on file  Other Topics Concern  . Not on file  Social History Narrative   Daily caffeine    Daily care of wife; becoming more disabled; but still cooks;    Stress 1-10; 2       epworth sleepiness scale = 10 (11/02/2015)   Fun/[place]: [employer]   Denies abuse and feels safe at home.     Family History  Problem Relation Age of Onset  . Diabetes Mother   . Subarachnoid hemorrhage Daughter   . Lung cancer Brother        smoker  . Other Paternal Grandfather        typhoid  . Heart disease Neg Hx   . Stroke Neg Hx   . Hypertension Neg Hx   . Colon cancer Neg Hx     Review of Systems  Constitutional: Negative for chills and fever.  Respiratory: Positive for wheezing (occasional). Negative for cough and shortness of breath.   Cardiovascular: Negative for chest pain, palpitations and leg swelling.  Neurological: Negative for light-headedness and headaches.       Objective:   Vitals:   03/27/17 0851  BP: 138/70  Pulse: 60  Resp: 16  Temp: 98.1 F (36.7 C)  SpO2: 98%   Wt Readings from Last 3 Encounters:  03/27/17 164 lb (74.4 kg)  11/14/16 162 lb (73.5 kg)  10/24/16 161 lb (73 kg)   Body mass index is 22.87 kg/m.   Physical Exam    Constitutional: Appears well-developed and well-nourished. No distress.  HENT:  Head: Normocephalic and atraumatic.  Neck: Neck supple. No tracheal deviation present. No thyromegaly present.  No cervical lymphadenopathy Cardiovascular: Normal rate, regular rhythm and normal heart sounds.   No murmur heard. No carotid bruit .  No edema Pulmonary/Chest: Effort normal and breath sounds normal. No respiratory distress. No has no wheezes. No rales.  Skin: Skin is warm and dry. Not diaphoretic.  Psychiatric: Normal mood and affect. Behavior is normal.      Assessment & Plan:    See Problem List for Assessment and Plan of chronic medical problems.

## 2017-03-26 NOTE — Patient Instructions (Addendum)
  Medications reviewed and updated.  No changes recommended at this time.  Your prescription(s) have been submitted to your pharmacy. Please take as directed and contact our office if you believe you are having problem(s) with the medication(s).  Please followup annually in August

## 2017-03-27 ENCOUNTER — Encounter: Payer: Self-pay | Admitting: Internal Medicine

## 2017-03-27 ENCOUNTER — Ambulatory Visit (INDEPENDENT_AMBULATORY_CARE_PROVIDER_SITE_OTHER): Payer: Medicare HMO | Admitting: Internal Medicine

## 2017-03-27 VITALS — BP 138/70 | HR 60 | Temp 98.1°F | Resp 16 | Ht 71.0 in | Wt 164.0 lb

## 2017-03-27 DIAGNOSIS — E782 Mixed hyperlipidemia: Secondary | ICD-10-CM

## 2017-03-27 DIAGNOSIS — R918 Other nonspecific abnormal finding of lung field: Secondary | ICD-10-CM | POA: Diagnosis not present

## 2017-03-27 DIAGNOSIS — K219 Gastro-esophageal reflux disease without esophagitis: Secondary | ICD-10-CM | POA: Diagnosis not present

## 2017-03-27 DIAGNOSIS — R7303 Prediabetes: Secondary | ICD-10-CM

## 2017-03-27 DIAGNOSIS — I10 Essential (primary) hypertension: Secondary | ICD-10-CM

## 2017-03-27 MED ORDER — PRAVASTATIN SODIUM 40 MG PO TABS: ORAL_TABLET | ORAL | 3 refills | Status: DC

## 2017-03-27 NOTE — Assessment & Plan Note (Signed)
Lipid panel, CMP checked in August Continue current medications We will recheck blood work next August

## 2017-03-27 NOTE — Assessment & Plan Note (Signed)
Abnormal chest x-ray and CT of the chest with right upper lung and right middle lung nodules, nodularity Will recheck CT scan-last scan done in 2017 Asymptomatic, except for occasional wheeze CT chest ordered

## 2017-03-27 NOTE — Assessment & Plan Note (Signed)
GERD controlled Continue daily medication  

## 2017-03-27 NOTE — Assessment & Plan Note (Signed)
Lab Results  Component Value Date   HGBA1C 6.1 06/15/2014   Will recheck a1c at annual appt

## 2017-03-27 NOTE — Assessment & Plan Note (Signed)
BP well controlled Current regimen effective and well tolerated Continue current medications at current doses  

## 2017-04-10 ENCOUNTER — Ambulatory Visit (INDEPENDENT_AMBULATORY_CARE_PROVIDER_SITE_OTHER)
Admission: RE | Admit: 2017-04-10 | Discharge: 2017-04-10 | Disposition: A | Discharge status: Home / Self Care | Payer: Medicare HMO | Source: Ambulatory Visit | Attending: Internal Medicine | Admitting: Internal Medicine

## 2017-04-10 DIAGNOSIS — R918 Other nonspecific abnormal finding of lung field: Secondary | ICD-10-CM

## 2017-04-10 DIAGNOSIS — R911 Solitary pulmonary nodule: Secondary | ICD-10-CM | POA: Diagnosis not present

## 2017-04-11 ENCOUNTER — Inpatient Hospital Stay: Admission: RE | Admit: 2017-04-11 | Payer: Medicare HMO | Source: Ambulatory Visit

## 2017-04-12 ENCOUNTER — Other Ambulatory Visit: Payer: Self-pay | Admitting: Internal Medicine

## 2017-04-12 DIAGNOSIS — R9389 Abnormal findings on diagnostic imaging of other specified body structures: Secondary | ICD-10-CM

## 2017-04-25 ENCOUNTER — Encounter: Payer: Self-pay | Admitting: Internal Medicine

## 2017-04-25 ENCOUNTER — Ambulatory Visit: Payer: Medicare HMO | Admitting: Internal Medicine

## 2017-04-25 ENCOUNTER — Ambulatory Visit (INDEPENDENT_AMBULATORY_CARE_PROVIDER_SITE_OTHER)
Admission: RE | Admit: 2017-04-25 | Discharge: 2017-04-25 | Disposition: A | Discharge status: Home / Self Care | Payer: Medicare HMO | Source: Ambulatory Visit | Attending: Internal Medicine | Admitting: Internal Medicine

## 2017-04-25 VITALS — BP 132/78 | HR 71 | Ht 69.25 in | Wt 162.0 lb

## 2017-04-25 DIAGNOSIS — R0989 Other specified symptoms and signs involving the circulatory and respiratory systems: Secondary | ICD-10-CM | POA: Diagnosis not present

## 2017-04-25 DIAGNOSIS — J479 Bronchiectasis, uncomplicated: Secondary | ICD-10-CM

## 2017-04-25 NOTE — Progress Notes (Signed)
Subjective:     Patient ID: Ricardo Sloan, male   DOB: 05/18/1931,     MRN: [id number]  HPI  62 yowm quit smoking  1967 with pna x 2 in HS  He remembers R side with residual Bronchiectasis RML and Lingula by CT 04/14/13  referred to pulmonary clinic 04/25/2017 by Dr  Quay Burow for eval of abn cxr//ct    04/25/2017 1st [facility] office visit/ Alexa Golebiewski   Chief Complaint  Patient presents with  . Pulmonary Consult    Referred by Dr. Billey Gosling for eval of abnormal ct chest.  He has hx of pulmonary nodule.  He has occ throat clearing and am cough, non prod.   cough x years worse in am's x < 1 tsp p very brief cough/ rarely discolored/ same all year / some assoc mild nasal obst better with clariton prn Sleeps ok flat Not limited by breathing from desired activities    No obvious day to day or daytime variability or assoc excess/ purulent sputum or mucus plugs or hemoptysis or cp or chest tightness, subjective wheeze or overt sinus or hb symptoms. No unusual exposure hx or h/o childhood pna/ asthma or knowledge of premature birth.  Sleeping ok flat without nocturnal  or early am exacerbation  of respiratory  c/o's or need for noct saba. Also denies any obvious fluctuation of symptoms with weather or environmental changes or other aggravating or alleviating factors except as outlined above   Current Allergies, Complete Past Medical History, Past Surgical History, Family History, and Social History were reviewed in [employer] record.  ROS  The following are not active complaints unless bolded Hoarseness, sore throat, dysphagia, dental problems, itching, sneezing,  nasal congestion or discharge of excess mucus or purulent secretions, ear ache,   fever, chills, sweats, unintended wt loss or wt gain, classically pleuritic or exertional cp,  orthopnea pnd or leg swelling, presyncope, palpitations, abdominal pain, anorexia, nausea, vomiting, diarrhea  or change in bowel habits or  change in bladder habits, change in stools or change in urine, dysuria, hematuria,  rash, arthralgias, visual complaints, headache, numbness, weakness or ataxia or problems with walking or coordination,  change in mood/affect or memory.        Current Meds  Medication Sig  . amLODipine (NORVASC) 5 MG tablet Take 1 tablet (5 mg total) by mouth daily.  . Aspirin (ADULT ASPIRIN LOW STRENGTH) 81 MG EC tablet Take 81 mg by mouth daily.    Marland Kitchen loratadine (CLARITIN) 10 MG tablet Take 10 mg by mouth daily.    . metoprolol succinate (TOPROL XL) 25 MG 24 hr tablet Take 1 tablet (25 mg total) by mouth daily.  . Multiple Vitamin (MULTIVITAMIN) tablet Take 1 tablet by mouth daily.    Marland Kitchen omeprazole (PRILOSEC) 40 MG capsule Take 1 capsule (40 mg total) by mouth daily.  . pravastatin (PRAVACHOL) 40 MG tablet TAKE ONE-HALF TABLET BY MOUTH EVERY NIGHT AT BEDTIME           Review of Systems     Objective:   Physical Exam    robust pleasant elderly wm nad  Wt Readings from Last 3 Encounters:  04/25/17 162 lb (73.5 kg)  03/27/17 164 lb (74.4 kg)  11/14/16 162 lb (73.5 kg)     Vital signs reviewed - Note on arrival 02 sats  98% on RA     HEENT: nl dentition, turbinates bilaterally, and oropharynx. Nl external ear canals without cough reflex  NECK :  without JVD/Nodes/TM/ nl carotid upstrokes bilaterally   LUNGS: no acc muscle use,  Nl contour chest which is clear to A and P bilaterally without cough on insp or exp maneuvers   CV:  RRR  no s3 or murmur or increase in P2, and no edema   ABD:  soft and nontender with nl inspiratory excursion in the supine position. No bruits or organomegaly appreciated, bowel sounds nl  MS:  Nl gait/ ext warm without deformities, calf tenderness, cyanosis or clubbing No obvious joint restrictions   SKIN: warm and dry without lesions    NEURO:  alert, approp, nl sensorium with  no motor or cerebellar deficits apparent.     CXR PA and Lateral:    04/25/2017 :    I personally reviewed images and agree with radiology impression as follows:    1. Lungs are hyperexpanded indicating COPD. 2. Chronic interstitial lung disease and chronic bronchitic changes. 3. Aortic atherosclerosis. 4. No acute findings.  No evidence of pneumonia or pulmonary edema.   Assessment:

## 2017-04-25 NOTE — Patient Instructions (Addendum)
Bronchiectasis =   you have scarring of your bronchial tubes which means that they don't function perfectly normally and mucus tends to pool in certain areas of your lung which can cause pneumonia and further scarring of your lung and bronchial tubes  Whenever you develop cough congestion take mucinex or mucinex dm up to 1200 mg every 12 hours > these will help keep the mucus loose and flowing but if your condition worsens you need to seek help immediately preferably here or somewhere inside the [facility] system to compare xrays ( worse = darker or bloody mucus or pain on breathing in)     Please remember to go to the  x-ray department downstairs in the basement  for your tests - we will call you with the results when they are available.      Please schedule a follow up visit in 3 months but call sooner if needed with pfts on return

## 2017-04-26 ENCOUNTER — Encounter: Payer: Self-pay | Admitting: Internal Medicine

## 2017-04-26 NOTE — Progress Notes (Signed)
Spoke with pt and notified of results per Dr. Wert. Pt verbalized understanding and denied any questions. 

## 2017-04-26 NOTE — Assessment & Plan Note (Addendum)
CT 04/10/17 1. Re demonstrated micro nodularity in the right upper, and middle lobes and lingula. Mild right middle lobe and lingular bronchiectasis with mucous plugging. Slight increased peripheral nodularity in the right lower lobe. Constellation of findings suggests chronic infection such as M AI. There are new foci of ground-glass density in the right middle lobe and posterior right upper lobe suspicious for acute infection or inflammation. 2. Stable mild mediastinal adenopathy 3. Lobulated extrapleural soft tissue densities along the anterior parasternal region of uncertain etiology, possibly representing dilated vascular structures versus nonspecific soft tissue nodules. These are stable in size and appearance compared with 10/2015 but new since January 2015; given stability, favor benign process    Classic bronchiectasis with probable low grade MAI.  This is an extremely common benign condition in the elderly and does not warrant aggressive eval/ rx at this point unless there is a clinical correlation suggesting unaddressed pulmonary infection (purulent sputum, night sweats, unintended wt loss, doe) or evolution of  obvious changes on plain cxr (as opposed to serial CT, which is way over sensitive to make clinical decisions re intervention and treatment in the elderly, who tend to tolerate both dx and treatment poorly) .  He may have some gerd and sinusitis assoc with bronchiectasis but already on rx and not apparently major issues for now.   Discussed in detail all the  indications, usual  risks and alternatives  relative to the benefits with patient who agrees to proceed with conservative f/u as outlined  With pfts on return.   Also reviewed the etiology/ pathophysiology of bronchiectasis using an escalator analogy which he seemed to understand well.   Total time devoted to counseling  > 50 % of initial 40 min office visit:  review case with pt/ discussion of options/alternatives/  personally creating written customized instructions  in presence of pt  then going over those specific  Instructions directly with the pt including how to use all of the meds but in particular covering each new medication in detail and the difference between the maintenance= "automatic" meds and the prns using an action plan format for the latter (If this problem/symptom => do that organization reading Left to right).  Please see AVS from this visit for a full list of these instructions which I personally wrote for this pt and  are unique to this visit.

## 2017-05-16 DIAGNOSIS — R69 Illness, unspecified: Secondary | ICD-10-CM | POA: Diagnosis not present

## 2017-06-25 DIAGNOSIS — H524 Presbyopia: Secondary | ICD-10-CM | POA: Diagnosis not present

## 2017-06-25 DIAGNOSIS — H25813 Combined forms of age-related cataract, bilateral: Secondary | ICD-10-CM | POA: Diagnosis not present

## 2017-06-25 DIAGNOSIS — H40013 Open angle with borderline findings, low risk, bilateral: Secondary | ICD-10-CM | POA: Diagnosis not present

## 2017-07-23 ENCOUNTER — Encounter: Payer: Self-pay | Admitting: Internal Medicine

## 2017-07-23 ENCOUNTER — Ambulatory Visit: Payer: Medicare HMO | Admitting: Internal Medicine

## 2017-07-23 ENCOUNTER — Ambulatory Visit (INDEPENDENT_AMBULATORY_CARE_PROVIDER_SITE_OTHER): Payer: Medicare HMO | Admitting: Internal Medicine

## 2017-07-23 VITALS — BP 120/60 | HR 78 | Ht 67.0 in | Wt 161.0 lb

## 2017-07-23 DIAGNOSIS — J479 Bronchiectasis, uncomplicated: Secondary | ICD-10-CM

## 2017-07-23 LAB — PULMONARY FUNCTION TEST
DL/VA % pred: 88 %
DL/VA: 3.86 ml/min/mmHg/L
DLCO UNC % PRED: 99 %
DLCO unc: 28.08 ml/min/mmHg
FEF 25-75 PRE: 1.37 L/s
FEF 25-75 Post: 1.83 L/sec
FEF2575-%Change-Post: 33 %
FEF2575-%PRED-PRE: 91 %
FEF2575-%Pred-Post: 121 %
FEV1-%Change-Post: 8 %
FEV1-%PRED-POST: 132 %
FEV1-%Pred-Pre: 122 %
FEV1-POST: 3.07 L
FEV1-PRE: 2.84 L
FEV1FVC-%CHANGE-POST: 8 %
FEV1FVC-%Pred-Pre: 90 %
FEV6-%CHANGE-POST: 1 %
FEV6-%PRED-POST: 139 %
FEV6-%PRED-PRE: 136 %
FEV6-POST: 4.31 L
FEV6-PRE: 4.23 L
FEV6FVC-%Change-Post: 1 %
FEV6FVC-%PRED-POST: 106 %
FEV6FVC-%PRED-PRE: 104 %
FVC-%CHANGE-POST: 0 %
FVC-%Pred-Post: 130 %
FVC-%Pred-Pre: 130 %
FVC-POST: 4.4 L
FVC-Pre: 4.4 L
POST FEV6/FVC RATIO: 98 %
PRE FEV1/FVC RATIO: 65 %
Post FEV1/FVC ratio: 70 %
Pre FEV6/FVC Ratio: 96 %
RV % PRED: 103 %
RV: 2.68 L
TLC % PRED: 127 %
TLC: 8.22 L

## 2017-07-23 NOTE — Progress Notes (Signed)
Subjective:     Patient ID: Ricardo Sloan, male   DOB: 1932-01-18,     MRN: [id number]    Brief patient profile:  27 yowm quit smoking  1967 with pna x 2 in HS  He remembers R side with residual Bronchiectasis RML and Lingula by CT 04/14/13  referred to pulmonary clinic 04/25/2017 by Dr  Quay Burow for eval of abn cxr//ct with dx bronchiectasis/  nl pfts 07/23/2017     04/25/2017 1st [facility] office visit/ Ricardo Sloan   Chief Complaint  Patient presents with  . Pulmonary Consult    Referred by Dr. Billey Gosling for eval of abnormal ct chest.  He has hx of pulmonary nodule.  He has occ throat clearing and am cough, non prod.   cough x years worse in am's x < 1 tsp p very brief cough/ rarely discolored/ same all year / some assoc mild nasal obst better with clariton prn Sleeps ok flat Not limited by breathing from desired activities   rec Bronchiectasis  Pathophysiology   07/23/2017  f/u ov/[name] re:  Bronchiectasis  Chief Complaint  Patient presents with  . Follow-up    PFT's done today.  Breathing is doing well. No new co's.    Dyspnea:  Not limited by breathing from desired activities   Cough: resolved  Sleep: fine  SABA use:     No obvious day to day or daytime variability or assoc excess/ purulent sputum or mucus plugs or hemoptysis or cp or chest tightness, subjective wheeze or overt sinus or hb symptoms. No unusual exposure hx or h/o childhood pna/ asthma or knowledge of premature birth.  Sleeping  flat  without nocturnal  or early am exacerbation  of respiratory  c/o's or need for noct saba. Also denies any obvious fluctuation of symptoms with weather or environmental changes or other aggravating or alleviating factors except as outlined above   Current Allergies, Complete Past Medical History, Past Surgical History, Family History, and Social History were reviewed in [employer] record.  ROS  The following are not active complaints unless bolded Hoarseness,  sore throat, dysphagia, dental problems, itching, sneezing,  nasal congestion or discharge of excess mucus or purulent secretions, ear ache,   fever, chills, sweats, unintended wt loss or wt gain, classically pleuritic or exertional cp,  orthopnea pnd or arm/hand swelling  or leg swelling, presyncope, palpitations, abdominal pain, anorexia, nausea, vomiting, diarrhea  or change in bowel habits or change in bladder habits, change in stools or change in urine, dysuria, hematuria,  rash, arthralgias, visual complaints, headache, numbness, weakness or ataxia or problems with walking or coordination,  change in mood or  memory.        Current Meds  Medication Sig  . amLODipine (NORVASC) 5 MG tablet Take 1 tablet (5 mg total) by mouth daily.  . Aspirin (ADULT ASPIRIN LOW STRENGTH) 81 MG EC tablet Take 81 mg by mouth daily.    Marland Kitchen loratadine (CLARITIN) 10 MG tablet Take 10 mg by mouth daily.    . metoprolol succinate (TOPROL XL) 25 MG 24 hr tablet Take 1 tablet (25 mg total) by mouth daily.  . Multiple Vitamin (MULTIVITAMIN) tablet Take 1 tablet by mouth daily.    Marland Kitchen omeprazole (PRILOSEC) 40 MG capsule Take 1 capsule (40 mg total) by mouth daily.  . pravastatin (PRAVACHOL) 40 MG tablet TAKE ONE-HALF TABLET BY MOUTH EVERY NIGHT AT BEDTIME  Objective:   Physical Exam  amb wm nad    07/23/2017      161  04/25/17 162 lb (73.5 kg)  03/27/17 164 lb (74.4 kg)  11/14/16 162 lb (73.5 kg)      Vital signs reviewed - Note on arrival 02 sats  98% on RA       HEENT: nl dentition, turbinates bilaterally, and oropharynx. Nl external ear canals without cough reflex   NECK :  without JVD/Nodes/TM/ nl carotid upstrokes bilaterally   LUNGS: no acc muscle use,  Nl contour chest which is clear to A and P bilaterally without cough on insp or exp maneuvers   CV:  RRR  no s3 or murmur or increase in P2, and no edema   ABD:  soft and nontender with nl inspiratory excursion in  the supine position. No bruits or organomegaly appreciated, bowel sounds nl  MS:  Nl gait/ ext warm without deformities, calf tenderness, cyanosis or clubbing No obvious joint restrictions   SKIN: warm and dry without lesions    NEURO:  alert, approp, nl sensorium with  no motor or cerebellar deficits apparent.         Assessment:

## 2017-07-23 NOTE — Progress Notes (Signed)
Patient completed full PFT today. 

## 2017-07-23 NOTE — Patient Instructions (Signed)
Best cough medication is mucinex or mucinex dm up to 1200 mg every 12 hours as needed  If mucus turns nasty contact your primary care doctor or me  Pulmonary follow up can be as needed

## 2017-07-24 ENCOUNTER — Encounter: Payer: Self-pay | Admitting: Internal Medicine

## 2017-07-24 NOTE — Assessment & Plan Note (Signed)
CT 04/10/17 1. Re demonstrated micro nodularity in the right upper, and middle lobes and lingula. Mild right middle lobe and lingular bronchiectasis with mucous plugging. Slight increased peripheral nodularity in the right lower lobe. Constellation of findings suggests chronic infection such as M AI. There are new foci of ground-glass density in the right middle lobe and posterior right upper lobe suspicious for acute infection or inflammation. 2. Stable mild mediastinal adenopathy 3. Lobulated extrapleural soft tissue densities along the anterior parasternal region of uncertain etiology, possibly representing dilated vascular structures versus nonspecific soft tissue nodules. These are stable in size and appearance compared with 10/2015 but new since January 2015; given stability, favor benign process - PFT's  07/23/2017   wnl   I had an extended final summary discussion with the patient reviewing all relevant studies completed to date and  lasting 15 to 20 minutes of a 25 minute visit on the following issues:   Although prone to recurrent infections due to mucociliary dysfunction, he has no significant airflow obst or chronic symptoms that need any kind of maint rx other than keeping up with his flu shots (as he has completed all the needed pneumonia vaccinations per guidelines)   For cough should use expectorants than don't thicken secretions eg mucinex or mucinex dm  For purulent or bloody sputum he should contact his pcp or me right away  Each maintenance medication was reviewed in detail including most importantly the difference between maintenance and as needed and under what circumstances the prns are to be used.  Please see AVS for specific  Instructions which are unique to this visit and I personally typed out  which were reviewed in detail in writing with the patient and a copy provided.       :

## 2017-07-29 ENCOUNTER — Telehealth: Payer: Self-pay | Admitting: Internal Medicine

## 2017-07-29 NOTE — Telephone Encounter (Signed)
Copied from [place]. Topic: Appointment Scheduling - Scheduling Inquiry for Clinic >> Jul 29, 2017  2:30 PM Synthia Innocent wrote: Reason for CRM: Patient is requesting to switch from Dr Quay Burow to Dr Ethelene Hal, due to location. Please advise

## 2017-07-29 NOTE — Telephone Encounter (Signed)
Ok with me 

## 2017-07-30 ENCOUNTER — Telehealth: Payer: Self-pay

## 2017-07-30 NOTE — Telephone Encounter (Signed)
Okay with me 

## 2017-07-30 NOTE — Telephone Encounter (Signed)
Left voicemail for patient to call our office to schedule appointment to transfer to [facility].

## 2017-07-30 NOTE — Telephone Encounter (Signed)
Lavella Lemons, could you please schedule patient.  Thanks!

## 2017-07-30 NOTE — Telephone Encounter (Signed)
Okay to schedule patient. 

## 2017-08-01 NOTE — Telephone Encounter (Signed)
Pt has an appt with dr Ethelene Hal on 08-06-17

## 2017-08-06 ENCOUNTER — Encounter: Payer: Self-pay | Admitting: Family Medicine

## 2017-08-06 ENCOUNTER — Ambulatory Visit (INDEPENDENT_AMBULATORY_CARE_PROVIDER_SITE_OTHER): Payer: Medicare HMO | Admitting: Family Medicine

## 2017-08-06 VITALS — BP 128/68 | HR 67 | Ht 67.0 in | Wt 162.0 lb

## 2017-08-06 DIAGNOSIS — I2510 Atherosclerotic heart disease of native coronary artery without angina pectoris: Secondary | ICD-10-CM | POA: Diagnosis not present

## 2017-08-06 DIAGNOSIS — D649 Anemia, unspecified: Secondary | ICD-10-CM | POA: Diagnosis not present

## 2017-08-06 DIAGNOSIS — K219 Gastro-esophageal reflux disease without esophagitis: Secondary | ICD-10-CM

## 2017-08-06 DIAGNOSIS — I10 Essential (primary) hypertension: Secondary | ICD-10-CM

## 2017-08-06 DIAGNOSIS — E782 Mixed hyperlipidemia: Secondary | ICD-10-CM

## 2017-08-06 DIAGNOSIS — Z125 Encounter for screening for malignant neoplasm of prostate: Secondary | ICD-10-CM

## 2017-08-06 NOTE — Progress Notes (Signed)
Subjective:  Patient ID: Ricardo Sloan, male    DOB: 1931-03-31  Age: 84 y.o. MRN: [id number]  CC: Establish Care   HPI REQUAN HARDGE presents for establishment of care and follow-up of his hypertension reflux hyperlipidemia and anemia.  Blood pressure is been well controlled with amlodipine and metoprolol.  Cholesterol has been controlled as well with Pravachol.  He has no history of coronary artery disease or cerebrovascular accident.  Recent chest x-ray did show a calcified aorta so he carries a diagnosis of arterial sclerosis.  He quit smoking back in 1967.  He drinks alcohol occasionally.  Uses no illicit drug use.  He remains active in his home and his yard.  He is an avid gardener.  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  He denies a change in his bowel habits or melena.  No hematuria.  He says that his urine flow is good.  Chart review of his CT scan of his chest taken in mid January did show calcification of his coronary arteries as well as the aorta.  Outpatient Medications Prior to Visit  Medication Sig Dispense Refill  . amLODipine (NORVASC) 5 MG tablet Take 1 tablet (5 mg total) by mouth daily. 90 tablet 3  . Aspirin (ADULT ASPIRIN LOW STRENGTH) 81 MG EC tablet Take 81 mg by mouth daily.      Marland Kitchen loratadine (CLARITIN) 10 MG tablet Take 10 mg by mouth daily.      . metoprolol succinate (TOPROL XL) 25 MG 24 hr tablet Take 1 tablet (25 mg total) by mouth daily. 90 tablet 3  . Multiple Vitamin (MULTIVITAMIN) tablet Take 1 tablet by mouth daily.      Marland Kitchen omeprazole (PRILOSEC) 40 MG capsule Take 1 capsule (40 mg total) by mouth daily. 90 capsule 6  . pravastatin (PRAVACHOL) 40 MG tablet TAKE ONE-HALF TABLET BY MOUTH EVERY NIGHT AT BEDTIME 45 tablet 3   No facility-administered medications prior to visit.     ROS Review of Systems  Constitutional: Negative for chills, fatigue, fever and unexpected weight change.  HENT: Negative.   Eyes: Negative for photophobia and visual  disturbance.  Respiratory: Negative.   Cardiovascular: Negative.   Gastrointestinal: Negative.   Endocrine: Negative for polyphagia and polyuria.  Genitourinary: Negative for difficulty urinating, frequency and urgency.  Musculoskeletal: Negative for neck pain and neck stiffness.  Skin: Negative for color change and pallor.  Neurological: Negative for weakness and headaches.  Hematological: Does not bruise/bleed easily.  Psychiatric/Behavioral: Negative.     Objective:  BP 128/68   Pulse 67   Ht 5\' 7"  (1.702 m)   Wt 162 lb (73.5 kg)   SpO2 98%   BMI 25.37 kg/m   BP Readings from Last 3 Encounters:  08/06/17 128/68  07/23/17 120/60  04/25/17 132/78    Wt Readings from Last 3 Encounters:  08/06/17 162 lb (73.5 kg)  07/23/17 161 lb (73 kg)  04/25/17 162 lb (73.5 kg)    Physical Exam  Constitutional: He is oriented to person, place, and time. He appears well-developed and well-nourished. No distress.  HENT:  Head: Normocephalic and atraumatic.  Right Ear: External ear normal.  Left Ear: External ear normal.  Nose: Nose normal.  Mouth/Throat: Oropharynx is clear and moist. No oropharyngeal exudate.  Eyes: Pupils are equal, round, and reactive to light. Conjunctivae and EOM are normal. Right eye exhibits no discharge. Left eye exhibits no discharge. No scleral icterus.  Neck: Normal range of motion.  Neck supple. No JVD present. No tracheal deviation present. No thyromegaly present.  Cardiovascular: Normal rate, regular rhythm and normal heart sounds.  Pulmonary/Chest: Effort normal and breath sounds normal. No stridor. No respiratory distress. He has no wheezes. He has no rales.  Abdominal: Soft. Bowel sounds are normal. He exhibits no distension and no mass. There is no tenderness. There is no guarding.  Musculoskeletal: He exhibits no edema, tenderness or deformity.       Cervical back: He exhibits normal range of motion, no tenderness and no bony tenderness.    Lymphadenopathy:    He has no cervical adenopathy.  Neurological: He is alert and oriented to person, place, and time. He has normal strength. He displays no atrophy and no tremor. He exhibits normal muscle tone. He displays no seizure activity.  Skin: Skin is warm and dry. No rash noted. He is not diaphoretic. No erythema. No pallor.  Psychiatric: He has a normal mood and affect. His behavior is normal. Thought content normal.   The ASCVD Risk score Mikey Bussing DC Jr., et al., 2013) failed to calculate for the following reasons:   The 2013 ASCVD risk score is only valid for ages 62 to 54 Lab Results  Component Value Date   WBC 5.5 10/24/2016   HGB 12.8 (L) 10/24/2016   HCT 38.3 (L) 10/24/2016   PLT 202.0 10/24/2016   GLUCOSE 94 10/24/2016   CHOL 153 10/24/2016   TRIG 74.0 10/24/2016   HDL 45.20 10/24/2016   LDLCALC 93 10/24/2016   ALT 20 10/24/2016   AST 21 10/24/2016   NA 139 10/24/2016   K 4.2 10/24/2016   CL 104 10/24/2016   CREATININE 1.33 10/24/2016   BUN 29 (H) 10/24/2016   CO2 28 10/24/2016   TSH 1.86 06/14/2014   PSA 1.01 10/24/2016   INR 1.0 ratio 07/28/2009   HGBA1C 6.1 06/15/2014    Dg Chest 2 View  Result Date: 04/25/2017 CLINICAL DATA:  Routine, chest congestion x 1-2 yrs only in morning, hx of HTN, PNA, lung nodules, bronchiectasis. EXAM: CHEST  2 VIEW COMPARISON:  Chest x-ray dated 03/29/2015. FINDINGS: Heart size is normal. Overall cardiomediastinal silhouette is stable. Atherosclerotic changes again noted at the aortic arch. Lungs are hyperexpanded. Coarse lung markings bilaterally are stable, indicating chronic interstitial lung disease. Chronic bronchitic changes noted centrally. No confluent opacity to suggest a developing pneumonia. No pulmonary nodule or mass appreciated. No pleural effusion or pneumothorax seen. Mild degenerative spurring within the thoracic spine. No acute or suspicious osseous finding. IMPRESSION: 1. Lungs are hyperexpanded indicating COPD. 2.  Chronic interstitial lung disease and chronic bronchitic changes. 3. Aortic atherosclerosis. 4. No acute findings.  No evidence of pneumonia or pulmonary edema. Electronically Signed   By: Franki Cabot M.D.   On: 04/25/2017 20:34    Assessment & Plan:   Reedy was seen today for establish care.  Diagnoses and all orders for this visit:  Essential hypertension -     CBC; Future -     Comprehensive metabolic panel; Future -     Urinalysis, Routine w reflex microscopic; Future -     Microalbumin / creatinine urine ratio; Future  Gastroesophageal reflux disease, esophagitis presence not specified  Mixed hyperlipidemia -     Comprehensive metabolic panel; Future -     Lipid panel; Future  Anemia, unspecified type -     CBC; Future -     Iron, TIBC and Ferritin Panel; Future -     TSH; Future -  Urinalysis, Routine w reflex microscopic; Future -     B12 and Folate Panel; Future  Screening for prostate cancer -     PSA; Future  ASCVD (arteriosclerotic cardiovascular disease) -     Lipid panel; Future   I am having Charleston Hankin. Grand[place] Silos maintain his Aspirin, loratadine, multivitamin, amLODipine, metoprolol succinate, omeprazole, and pravastatin.  No orders of the defined types were placed in this encounter.    Follow-up: Return in about 6 months (around 02/06/2018), or return for fasting blood work.Libby Maw, MD

## 2017-08-13 ENCOUNTER — Other Ambulatory Visit (INDEPENDENT_AMBULATORY_CARE_PROVIDER_SITE_OTHER): Payer: Medicare HMO

## 2017-08-13 DIAGNOSIS — I10 Essential (primary) hypertension: Secondary | ICD-10-CM

## 2017-08-13 DIAGNOSIS — E782 Mixed hyperlipidemia: Secondary | ICD-10-CM | POA: Diagnosis not present

## 2017-08-13 DIAGNOSIS — I2510 Atherosclerotic heart disease of native coronary artery without angina pectoris: Secondary | ICD-10-CM | POA: Diagnosis not present

## 2017-08-13 DIAGNOSIS — D649 Anemia, unspecified: Secondary | ICD-10-CM | POA: Diagnosis not present

## 2017-08-13 DIAGNOSIS — Z125 Encounter for screening for malignant neoplasm of prostate: Secondary | ICD-10-CM | POA: Diagnosis not present

## 2017-08-13 LAB — URINALYSIS, ROUTINE W REFLEX MICROSCOPIC
BILIRUBIN URINE: NEGATIVE
Hgb urine dipstick: NEGATIVE
Ketones, ur: NEGATIVE
LEUKOCYTES UA: NEGATIVE
Nitrite: NEGATIVE
PH: 6.5 (ref 5.0–8.0)
RBC / HPF: NONE SEEN (ref 0–?)
Specific Gravity, Urine: 1.01 (ref 1.000–1.030)
Total Protein, Urine: NEGATIVE
Urine Glucose: NEGATIVE
Urobilinogen, UA: 0.2 (ref 0.0–1.0)

## 2017-08-13 LAB — CBC
HEMATOCRIT: 39.3 % (ref 39.0–52.0)
HEMOGLOBIN: 13.2 g/dL (ref 13.0–17.0)
MCHC: 33.6 g/dL (ref 30.0–36.0)
MCV: 91.7 fl (ref 78.0–100.0)
PLATELETS: 213 10*3/uL (ref 150.0–400.0)
RBC: 4.29 Mil/uL (ref 4.22–5.81)
RDW: 14 % (ref 11.5–15.5)
WBC: 5.3 10*3/uL (ref 4.0–10.5)

## 2017-08-13 LAB — COMPREHENSIVE METABOLIC PANEL
ALBUMIN: 4.3 g/dL (ref 3.5–5.2)
ALT: 20 U/L (ref 0–53)
AST: 26 U/L (ref 0–37)
Alkaline Phosphatase: 70 U/L (ref 39–117)
BUN: 28 mg/dL — AB (ref 6–23)
CO2: 26 mEq/L (ref 19–32)
Calcium: 9.5 mg/dL (ref 8.4–10.5)
Chloride: 102 mEq/L (ref 96–112)
Creatinine, Ser: 1.17 mg/dL (ref 0.40–1.50)
GFR: 63.1 mL/min (ref >60.00)
Glucose, Bld: 95 mg/dL (ref 70–99)
POTASSIUM: 4.1 meq/L (ref 3.5–5.1)
SODIUM: 138 meq/L (ref 135–145)
TOTAL PROTEIN: 6.5 g/dL (ref 6.0–8.3)
Total Bilirubin: 0.7 mg/dL (ref 0.2–1.2)

## 2017-08-13 LAB — LIPID PANEL
CHOLESTEROL: 147 mg/dL (ref 0–200)
HDL: 44.3 mg/dL (ref >39.00)
LDL Cholesterol: 87 mg/dL (ref 0–99)
NonHDL: 102.65
Total CHOL/HDL Ratio: 3
Triglycerides: 76 mg/dL (ref 0.0–149.0)
VLDL: 15.2 mg/dL (ref 0.0–40.0)

## 2017-08-13 LAB — MICROALBUMIN / CREATININE URINE RATIO
CREATININE, U: 63.8 mg/dL
Microalb Creat Ratio: 1.1 mg/g (ref 0.0–30.0)

## 2017-08-13 LAB — PSA: PSA: 0.98 ng/mL (ref 0.10–4.00)

## 2017-08-13 LAB — B12 AND FOLATE PANEL
Folate: >24.1 ng/mL (ref >5.9)
VITAMIN B 12: 844 pg/mL (ref 211–911)

## 2017-08-13 LAB — TSH: TSH: 2.74 u[IU]/mL (ref 0.35–4.50)

## 2017-08-14 LAB — IRON,TIBC AND FERRITIN PANEL
%SAT: 28 % (calc) (ref 15–60)
Ferritin: 322 ng/mL (ref 20–380)
IRON: 83 ug/dL (ref 50–180)
TIBC: 297 ug/dL (ref 250–425)

## 2017-10-28 ENCOUNTER — Encounter: Payer: Medicare HMO | Admitting: Internal Medicine

## 2017-10-28 ENCOUNTER — Ambulatory Visit: Payer: Medicare HMO

## 2017-10-29 NOTE — Progress Notes (Addendum)
Subjective:   Ricardo Sloan is a 84 y.o. male who presents for Medicare Annual/Subsequent preventive examination.  Pt states his wife is an invalid. He is her caregiver.  Review of Systems: No ROS.  Medicare Wellness Visit. Additional risk factors are reflected in the social history. Cardiac Risk Factors include: advanced age (>35men, >27 women);male gender;hypertension;dyslipidemia Sleep patterns: Sleeps 6-7 hrs. Naps after lunch for 30 min. Home Safety/Smoke Alarms: Feels safe in home. Smoke alarms in place.  Living environment; residence and Firearm Safety: No stairs. Has ramp.  Eye- utd on yearly exams Male:    PSA-  Lab Results  Component Value Date   PSA 0.98 08/13/2017   PSA 1.01 10/24/2016   PSA 0.80 10/24/2015       Objective:    Vitals: BP 130/82 (BP Location: Left Arm, Patient Position: Sitting, Cuff Size: Normal)   Pulse 60   Ht 5\' 9"  (1.753 m)   Wt 160 lb (72.6 kg)   SpO2 98%   BMI 23.63 kg/m   Body mass index is 23.63 kg/m.  Advanced Directives 10/30/2017 07/22/2014  Does Patient Have a Medical Advance Directive? Yes Yes  Type of Paramedic of [place];Living will Living will  Copy of [place] in Chart? No - copy requested No - copy requested    Tobacco Social History   Tobacco Use  Smoking Status Former Smoker  . Packs/day: 0.75  . Years: 13.00  . Pack years: 9.75  . Types: Cigarettes, Pipe  . Last attempt to quit: 03/26/1965  . Years since quitting: 52.6  Smokeless Tobacco Never Used  Tobacco Comment   smoked 1953-1968, up to 1 ppd     Counseling given: Not Answered Comment: smoked 1953-1968, up to 1 ppd   Clinical Intake: Pain : No/denies pain    Past Medical History:  Diagnosis Date  . BPH (benign prostatic hypertrophy)   . Colon polyps   . GERD (gastroesophageal reflux disease)    Upper endoscopy 2003, Dr. Velora Heckler  . Hemorrhoids   . Hiatal hernia 2010  . Hyperlipidemia   .  Hypertension   . Internal hemorrhoids   . Microscopic hematuria 2001   Dr.  Luanne Bras  . Pneumonia     X 2 in high school   Past Surgical History:  Procedure Laterality Date  . COLONOSCOPY  [id number]   Internal hemorrhoids, Dr. Velora Heckler  . NOSE SURGERY     Submucosal resection in the 1960s  . TONSILLECTOMY        . UPPER GASTROINTESTINAL ENDOSCOPY  2003   GERD  . WISDOM TOOTH EXTRACTION     Family History  Problem Relation Age of Onset  . Diabetes Mother   . Subarachnoid hemorrhage Daughter   . Lung cancer Brother        smoker  . Other Paternal Grandfather        typhoid  . Heart disease Neg Hx   . Stroke Neg Hx   . Hypertension Neg Hx   . Colon cancer Neg Hx    Social History   Socioeconomic History  . Marital status: Married    Spouse name: Not on file  . Number of children: Not on file  . Years of education: Not on file  . Highest education level: Not on file  Occupational History  . Not on file  Social Needs  . Financial resource strain: Not on file  . Food insecurity:    Worry: Not on  file    Inability: Not on file  . Transportation needs:    Medical: Not on file    Non-medical: Not on file  Tobacco Use  . Smoking status: Former Smoker    Packs/day: 0.75    Years: 13.00    Pack years: 9.75    Types: Cigarettes, Pipe    Last attempt to quit: 03/26/1965    Years since quitting: 52.6  . Smokeless tobacco: Never Used  . Tobacco comment: smoked 1953-1968, up to 1 ppd  Substance and Sexual Activity  . Alcohol use: Yes    Comment:  rarely socially; 1-2 / month  . Drug use: No  . Sexual activity: Not on file  Lifestyle  . Physical activity:    Days per week: Not on file    Minutes per session: Not on file  . Stress: Not on file  Relationships  . Social connections:    Talks on phone: Not on file    Gets together: Not on file    Attends religious service: Not on file    Active member of club or organization: Not on file    Attends meetings  of clubs or organizations: Not on file    Relationship status: Not on file  Other Topics Concern  . Not on file  Social History Narrative   Daily caffeine    Daily care of wife; becoming more disabled; but still cooks;    Stress 1-10; 2      epworth sleepiness scale = 10 (11/02/2015)   Fun/[place]: Garden   Denies abuse and feels safe at home.     Outpatient Encounter Medications as of 10/30/2017  Medication Sig  . amLODipine (NORVASC) 5 MG tablet Take 1 tablet (5 mg total) by mouth daily.  . Aspirin (ADULT ASPIRIN LOW STRENGTH) 81 MG EC tablet Take 81 mg by mouth daily.    Marland Kitchen loratadine (CLARITIN) 10 MG tablet Take 10 mg by mouth daily.    . metoprolol succinate (TOPROL XL) 25 MG 24 hr tablet Take 1 tablet (25 mg total) by mouth daily.  . Multiple Vitamin (MULTIVITAMIN) tablet Take 1 tablet by mouth daily.    Marland Kitchen omeprazole (PRILOSEC) 40 MG capsule Take 1 capsule (40 mg total) by mouth daily.  . pravastatin (PRAVACHOL) 40 MG tablet TAKE ONE-HALF TABLET BY MOUTH EVERY NIGHT AT BEDTIME   No facility-administered encounter medications on file as of 10/30/2017.     Activities of Daily Living In your present state of health, do you have any difficulty performing the following activities: 10/30/2017  Hearing? Y  Comment wears hearing aid in right ear. next audiology appt this week  Vision? N  Difficulty concentrating or making decisions? N  Walking or climbing stairs? N  Dressing or bathing? N  Doing errands, shopping? N  Preparing Food and eating ? N  Using the Toilet? N  In the past six months, have you accidently leaked urine? N  Do you have problems with loss of bowel control? N  Managing your Medications? N  Managing your Finances? N  Housekeeping or managing your Housekeeping? N  Some recent data might be hidden    Patient Care Team: Ricardo Maw, MD as PCP - General (Family Medicine) Irene Shipper, MD as Consulting Physician (Gastroenterology) Darlin Coco, MD as  Consulting Physician (Cardiology)   Assessment:   This is a routine wellness examination for [name]. Physical assessment deferred to PCP.  Exercise Activities and Dietary recommendations Exercise limited by: None  identified Diet (meal preparation, eat out, water intake, caffeinated beverages, dairy products, fruits and vegetables): in general, a "healthy" diet  , well balanced. Pt states he will try to drink more water.     Goals    . <enter goal here> (pt-stated)     To continue to care for wife; Is working on transport for wife (w/c) to conserve her energy so they can go to the park and do other things together so she will have less pain Will also continue to spend time in the garden, which is his joy. Continue to exercise  Read Being Mortal, which has inspired him at this juncture in his life.       Fall Risk Fall Risk  10/30/2017 10/24/2016 10/24/2015 07/22/2014 06/14/2014  Falls in the past year? No No No - No  Risk for fall due to : - - - (No Data) -  Risk for fall due to: Comment - - - no fall risk identified -    Depression Screen PHQ 2/9 Scores 10/30/2017 10/24/2016 10/24/2015 07/22/2014  PHQ - 2 Score 0 0 0 0    Cognitive Function Ad8 score reviewed for issues:  Issues making decisions:no  Less interest in hobbies / activities:no  Repeats questions, stories (family complaining):no  Trouble using ordinary gadgets (microwave, computer, phone):no  Forgets the month or year: no  Mismanaging finances: no  Remembering appts:no  Daily problems with thinking and/or memory:no Ad8 score is=0   MMSE - Mini Mental State Exam 07/22/2014  Not completed: Unable to complete        Immunization History  Administered Date(s) Administered  . Influenza Split 12/21/2011  . Influenza Whole 01/21/2007  . Influenza, High Dose Seasonal PF 12/13/2015, 12/26/2016  . Influenza,inj,Quad PF,6+ Mos 12/19/2012, 12/28/2013  . Influenza-Unspecified 12/27/2014  . Pneumococcal Conjugate-13  07/22/2014  . Pneumococcal Polysaccharide-23 03/14/2004, 10/03/2009  . Td 03/27/1995, 07/23/2005  . Tdap 06/25/2007  . Zoster 01/30/2007    Screening Tests Health Maintenance  Topic Date Due  . TETANUS/TDAP  06/24/2017  . INFLUENZA VACCINE  10/24/2017  . PNA vac Low Risk Adult  Completed       Plan:    Please schedule your next medicare wellness visit with me in 1 yr.  Continue to eat heart healthy diet (full of fruits, vegetables, whole grains, lean protein, water--limit salt, fat, and sugar intake) and increase physical activity as tolerated.  Continue doing brain stimulating activities (puzzles, reading, adult coloring books, staying active) to keep memory sharp.   Bring a copy of your living will and/or healthcare power of attorney to your next office visit.   I have personally reviewed and noted the following in the patient's chart:   . Medical and social history . Use of alcohol, tobacco or illicit drugs  . Current medications and supplements . Functional ability and status . Nutritional status . Physical activity . Advanced directives . List of other physicians . Hospitalizations, surgeries, and ER visits in previous 12 months . Vitals . Screenings to include cognitive, depression, and falls . Referrals and appointments  In addition, I have reviewed and discussed with patient certain preventive protocols, quality metrics, and best practice recommendations. A written personalized care plan for preventive services as well as general preventive health recommendations were provided to patient.   Note reviewed and I agree.  Shela Nevin, [place]  10/30/2017

## 2017-10-30 ENCOUNTER — Ambulatory Visit (INDEPENDENT_AMBULATORY_CARE_PROVIDER_SITE_OTHER): Payer: Medicare HMO | Admitting: Behavioral Health

## 2017-10-30 ENCOUNTER — Ambulatory Visit: Payer: Medicare HMO

## 2017-10-30 VITALS — BP 130/82 | HR 60 | Ht 69.0 in | Wt 160.0 lb

## 2017-10-30 DIAGNOSIS — Z0000 Encounter for general adult medical examination without abnormal findings: Secondary | ICD-10-CM

## 2017-10-30 NOTE — Patient Instructions (Addendum)
Please schedule your next medicare wellness visit with me in 1 yr.  Continue to eat heart healthy diet (full of fruits, vegetables, whole grains, lean protein, water--limit salt, fat, and sugar intake) and increase physical activity as tolerated.  Continue doing brain stimulating activities (puzzles, reading, adult coloring books, staying active) to keep memory sharp.   Bring a copy of your living will and/or healthcare power of attorney to your next office visit.   Ricardo Sloan , Thank you for taking time to come for your Medicare Wellness Visit. I appreciate your ongoing commitment to your health goals. Please review the following plan we discussed and let me know if I can assist you in the future.   These are the goals we discussed: Goals    . <enter goal here> (pt-stated)     To continue to care for wife; Is working on transport for wife (w/c) to conserve her energy so they can go to the park and do other things together so she will have less pain Will also continue to spend time in the garden, which is his joy. Continue to exercise  Read Being Mortal, which has inspired him at this juncture in his life.       This is a list of the screening recommended for you and due dates:  Health Maintenance  Topic Date Due  . Tetanus Vaccine  06/24/2017  . Flu Shot  10/24/2017  . Pneumonia vaccines  Completed    Health Maintenance, Male A healthy lifestyle and preventive care is important for your health and wellness. Ask your health care provider about what schedule of regular examinations is right for you. What should I know about weight and diet? Eat a Healthy Diet  Eat plenty of vegetables, fruits, whole grains, low-fat dairy products, and lean protein.  Do not eat a lot of foods high in solid fats, added sugars, or salt.  Maintain a Healthy Weight Regular exercise can help you achieve or maintain a healthy weight. You should:  Do at least 150 minutes of exercise each week. The  exercise should increase your heart rate and make you sweat (moderate-intensity exercise).  Do strength-training exercises at least twice a week.  Watch Your Levels of Cholesterol and Blood Lipids  Have your blood tested for lipids and cholesterol every 5 years starting at 82 years of age. If you are at high risk for heart disease, you should start having your blood tested when you are 82 years old. You may need to have your cholesterol levels checked more often if: ? Your lipid or cholesterol levels are high. ? You are older than 82 years of age. ? You are at high risk for heart disease.  What should I know about cancer screening? Many types of cancers can be detected early and may often be prevented. Lung Cancer  You should be screened every year for lung cancer if: ? You are a current smoker who has smoked for at least 30 years. ? You are a former smoker who has quit within the past 15 years.  Talk to your health care provider about your screening options, when you should start screening, and how often you should be screened.  Colorectal Cancer  Routine colorectal cancer screening usually begins at 82 years of age and should be repeated every 5-10 years until you are 82 years old. You may need to be screened more often if early forms of precancerous polyps or small growths are found. Your health care provider  may recommend screening at an earlier age if you have risk factors for colon cancer.  Your health care provider may recommend using home test kits to check for hidden blood in the stool.  A small camera at the end of a tube can be used to examine your colon (sigmoidoscopy or colonoscopy). This checks for the earliest forms of colorectal cancer.  Prostate and Testicular Cancer  Depending on your age and overall health, your health care provider may do certain tests to screen for prostate and testicular cancer.  Talk to your health care provider about any symptoms or concerns  you have about testicular or prostate cancer.  Skin Cancer  Check your skin from head to toe regularly.  Tell your health care provider about any new moles or changes in moles, especially if: ? There is a change in a mole's size, shape, or color. ? You have a mole that is larger than a pencil eraser.  Always use sunscreen. Apply sunscreen liberally and repeat throughout the day.  Protect yourself by wearing long sleeves, pants, a wide-brimmed hat, and sunglasses when outside.  What should I know about heart disease, diabetes, and high blood pressure?  If you are 56-77 years of age, have your blood pressure checked every 3-5 years. If you are 76 years of age or older, have your blood pressure checked every year. You should have your blood pressure measured twice-once when you are at a hospital or clinic, and once when you are not at a hospital or clinic. Record the average of the two measurements. To check your blood pressure when you are not at a hospital or clinic, you can use: ? An automated blood pressure machine at a pharmacy. ? A home blood pressure monitor.  Talk to your health care provider about your target blood pressure.  If you are between 28-79 years old, ask your health care provider if you should take aspirin to prevent heart disease.  Have regular diabetes screenings by checking your fasting blood sugar level. ? If you are at a normal weight and have a low risk for diabetes, have this test once every three years after the age of 41. ? If you are overweight and have a high risk for diabetes, consider being tested at a younger age or more often.  A one-time screening for abdominal aortic aneurysm (AAA) by ultrasound is recommended for men aged 16-75 years who are current or former smokers. What should I know about preventing infection? Hepatitis B If you have a higher risk for hepatitis B, you should be screened for this virus. Talk with your health care provider to find  out if you are at risk for hepatitis B infection. Hepatitis C Blood testing is recommended for:  Everyone born from [birth year] through 1965.  Anyone with known risk factors for hepatitis C.  Sexually Transmitted Diseases (STDs)  You should be screened each year for STDs including gonorrhea and chlamydia if: ? You are sexually active and are younger than 82 years of age. ? You are older than 82 years of age and your health care provider tells you that you are at risk for this type of infection. ? Your sexual activity has changed since you were last screened and you are at an increased risk for chlamydia or gonorrhea. Ask your health care provider if you are at risk.  Talk with your health care provider about whether you are at high risk of being infected with HIV. Your health care provider  may recommend a prescription medicine to help prevent HIV infection.  What else can I do?  Schedule regular health, dental, and eye exams.  Stay current with your vaccines (immunizations).  Do not use any tobacco products, such as cigarettes, chewing tobacco, and e-cigarettes. If you need help quitting, ask your health care provider.  Limit alcohol intake to no more than 2 drinks per day. One drink equals 12 ounces of beer, 5 ounces of wine, or 1 ounces of hard liquor.  Do not use street drugs.  Do not share needles.  Ask your health care provider for help if you need support or information about quitting drugs.  Tell your health care provider if you often feel depressed.  Tell your health care provider if you have ever been abused or do not feel safe at home. This information is not intended to replace advice given to you by your health care provider. Make sure you discuss any questions you have with your health care provider. Document Released: 09/08/2007 Document Revised: 11/09/2015 Document Reviewed: 12/14/2014 Elsevier Interactive Patient Education  [employer].

## 2017-10-31 DIAGNOSIS — H903 Sensorineural hearing loss, bilateral: Secondary | ICD-10-CM | POA: Diagnosis not present

## 2017-10-31 DIAGNOSIS — Z570 Occupational exposure to noise: Secondary | ICD-10-CM | POA: Diagnosis not present

## 2017-10-31 DIAGNOSIS — Z822 Family history of deafness and hearing loss: Secondary | ICD-10-CM | POA: Diagnosis not present

## 2017-11-20 DIAGNOSIS — R69 Illness, unspecified: Secondary | ICD-10-CM | POA: Diagnosis not present

## 2017-12-02 ENCOUNTER — Other Ambulatory Visit: Payer: Self-pay | Admitting: Internal Medicine

## 2017-12-02 DIAGNOSIS — I10 Essential (primary) hypertension: Secondary | ICD-10-CM

## 2017-12-10 ENCOUNTER — Ambulatory Visit: Payer: Medicare HMO | Admitting: Internal Medicine

## 2017-12-10 ENCOUNTER — Encounter: Payer: Self-pay | Admitting: Internal Medicine

## 2017-12-10 VITALS — BP 136/82 | HR 59 | Ht 69.0 in | Wt 160.0 lb

## 2017-12-10 DIAGNOSIS — R002 Palpitations: Secondary | ICD-10-CM

## 2017-12-10 DIAGNOSIS — I10 Essential (primary) hypertension: Secondary | ICD-10-CM

## 2017-12-10 DIAGNOSIS — E782 Mixed hyperlipidemia: Secondary | ICD-10-CM

## 2017-12-10 NOTE — Progress Notes (Signed)
OFFICE NOTE  Chief Complaint:  No complaints  Primary Care Physician: Libby Maw, MD  HPI:  Ricardo Sloan is a 84 y.o. male he was a former patient of Dr. Mare Ferrari. In 2016 he had a stress test which the treadmill performed by his primary care provider for an abnormal EKG. He does have history of hypertension and dyslipidemia. The treadmill stress test was abnormal and ultimately was referred for a Myoview. The Myoview was negative for ischemia. He was started on low-dose beta blocker which has kept his heart rate and blood pressure down. Blood pressure today was initially elevated but recheck came down to 130/78. EKG shows a sinus bradycardia at 51. He denies any chest pain or worsening shortness of breath. He says he has to take a nap almost every day but otherwise feels pretty energized. Unfortunately he is caring for his wife who has crippling rheumatoid arthritis.  11/14/2016  Ricardo Sloan was seen today in follow-up. Overall is doing well. 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 He is asymptomatic and denies any shortness of breath or chest pain. He notes his heart rate can increase up into the 70s or 80s with exertion. Recently had lab work including cholesterol which was a total cholesterol 153, trigs 74, HDL 45 and LDL 93. He reports some new prominent veins in his lower extremities which is concerned about. He also gets some occasional pain in his left hip which shoots down the left leg.  12/10/2017  Ricardo Sloan returns today for follow-up.  In general he seems to be doing well.  He denies chest pain or worsening shortness of breath.  Blood pressure is been well controlled.  His cholesterol is also at goal LDL less than 100 recently with total cholesterol 147, triglycerides 76, HDL 44 and LDL 87.  He very rarely gets palpitations but generally feels like there at night when he is more fatigued.  He has moved his beta-blocker to  take at night which she says he tolerates better and is less likely to give him fatigue.  PMHx:  Past Medical History:  Diagnosis Date  . BPH (benign prostatic hypertrophy)   . Colon polyps   . GERD (gastroesophageal reflux disease)    Upper endoscopy 2003, Dr. Velora Heckler  . Hemorrhoids   . Hiatal hernia 2010  . Hyperlipidemia   . Hypertension   . Internal hemorrhoids   . Microscopic hematuria 2001   Dr.  Luanne Bras  . Pneumonia     X 2 in high school    Past Surgical History:  Procedure Laterality Date  . COLONOSCOPY  [id number]   Internal hemorrhoids, Dr. Velora Heckler  . NOSE SURGERY     Submucosal resection in the 1960s  . TONSILLECTOMY        . UPPER GASTROINTESTINAL ENDOSCOPY  2003   GERD  . WISDOM TOOTH EXTRACTION      FAMHx:  Family History  Problem Relation Age of Onset  . Diabetes Mother   . Subarachnoid hemorrhage Daughter   . Lung cancer Brother        smoker  . Other Paternal Grandfather        typhoid  . Heart disease Neg Hx   . Stroke Neg Hx   . Hypertension Neg Hx   . Colon cancer Neg Hx     SOCHx:   reports that he quit smoking about 52 years ago. His smoking use  included cigarettes and pipe. He has a 9.75 pack-year smoking history. He has never used smokeless tobacco. He reports that he drinks alcohol. He reports that he does not use drugs.  ALLERGIES:  No Known Allergies  ROS: Pertinent items noted in HPI and remainder of comprehensive ROS otherwise negative.  HOME MEDS: Current Outpatient Medications  Medication Sig Dispense Refill  . amLODipine (NORVASC) 5 MG tablet Take 1 tablet (5 mg total) by mouth daily. 90 tablet 3  . Aspirin (ADULT ASPIRIN LOW STRENGTH) 81 MG EC tablet Take 81 mg by mouth daily.      Marland Kitchen loratadine (CLARITIN) 10 MG tablet Take 10 mg by mouth daily.      . metoprolol succinate (TOPROL-XL) 25 MG 24 hr tablet TAKE ONE TABLET BY MOUTH DAILY 30 tablet 0  . Multiple Vitamin (MULTIVITAMIN) tablet Take 1 tablet by mouth  daily.      Marland Kitchen omeprazole (PRILOSEC) 40 MG capsule Take 1 capsule (40 mg total) by mouth daily. 90 capsule 6  . pravastatin (PRAVACHOL) 40 MG tablet TAKE ONE-HALF TABLET BY MOUTH EVERY NIGHT AT BEDTIME 45 tablet 3   No current facility-administered medications for this visit.     LABS/IMAGING: No results found for this or any previous visit (from the past 48 hour(s)). No results found.  WEIGHTS: Wt Readings from Last 3 Encounters:  12/10/17 160 lb (72.6 kg)  10/30/17 160 lb (72.6 kg)  08/06/17 162 lb (73.5 kg)    VITALS: BP 136/82   Pulse (!) 59   Ht 5\' 9"  (1.753 m)   Wt 160 lb (72.6 kg)   BMI 23.63 kg/m   EXAM: General appearance: alert and no distress Neck: no carotid bruit and no JVD Lungs: clear to auscultation bilaterally Heart: regular rate and rhythm, S1, S2 normal, no murmur, click, rub or gallop Abdomen: soft, non-tender; bowel sounds normal; no masses,  no organomegaly Extremities: extremities normal, atraumatic, no cyanosis or edema Pulses: 2+ and symmetric Skin: Skin color, texture, turgor normal. No rashes or lesions Neurologic: Grossly normal Psych: Pleasant  EKG: Sinus bradycardia 59-personally reviewed  ASSESSMENT: 1. Hypertension-controlled 2. Dyslipidemia 3. History of palpitations-improved  PLAN: 1.   Ricardo Sloan continues to do well with well-controlled hypertension, dyslipidemia at goal and palpitations which occur very infrequently.  He is physically active and denies any chest pain or worsening shortness of breath.  No medicine changes were made today.  Plan follow-up annually per his request or sooner as necessary.  Pixie Casino, MD, [facility], [place] Director of the [facility]  [facility] Diplomate of the American Board of Clinical Lipidology Attending Cardiologist  Direct Dial: [phone]  Fax: [phone]  Website:  [email or web address]  Nadean Corwin  Breia Ocampo 12/10/2017, 2:28 PM

## 2017-12-10 NOTE — Patient Instructions (Signed)
Your physician wants you to follow-up in: ONE YEAR with Dr. Hilty. You will receive a reminder letter in the mail two months in advance. If you don't receive a letter, please call our office to schedule the follow-up appointment.  

## 2017-12-16 ENCOUNTER — Other Ambulatory Visit: Payer: Self-pay | Admitting: Internal Medicine

## 2017-12-16 DIAGNOSIS — I10 Essential (primary) hypertension: Secondary | ICD-10-CM

## 2017-12-26 ENCOUNTER — Ambulatory Visit (INDEPENDENT_AMBULATORY_CARE_PROVIDER_SITE_OTHER): Payer: Medicare HMO

## 2017-12-26 ENCOUNTER — Encounter: Payer: Self-pay | Admitting: Family Medicine

## 2017-12-26 DIAGNOSIS — Z23 Encounter for immunization: Secondary | ICD-10-CM

## 2017-12-26 NOTE — Progress Notes (Signed)
Patient came in to receive his flu vaccine. Patient received the vaccine in his left deltoid & tolerated the injection well. No signs/symptoms of a reaction prior to leaving the exam room. VIS given to patient.

## 2017-12-30 ENCOUNTER — Other Ambulatory Visit: Payer: Self-pay | Admitting: Internal Medicine

## 2017-12-30 DIAGNOSIS — I10 Essential (primary) hypertension: Secondary | ICD-10-CM

## 2018-01-08 DIAGNOSIS — H40013 Open angle with borderline findings, low risk, bilateral: Secondary | ICD-10-CM | POA: Diagnosis not present

## 2018-01-14 DIAGNOSIS — H25813 Combined forms of age-related cataract, bilateral: Secondary | ICD-10-CM | POA: Diagnosis not present

## 2018-01-14 DIAGNOSIS — H40013 Open angle with borderline findings, low risk, bilateral: Secondary | ICD-10-CM | POA: Diagnosis not present

## 2018-01-27 ENCOUNTER — Encounter: Payer: Self-pay | Admitting: Family Medicine

## 2018-01-27 ENCOUNTER — Ambulatory Visit (INDEPENDENT_AMBULATORY_CARE_PROVIDER_SITE_OTHER): Payer: Medicare HMO | Admitting: Family Medicine

## 2018-01-27 VITALS — BP 118/70 | HR 61 | Ht 69.0 in | Wt 158.4 lb

## 2018-01-27 DIAGNOSIS — K219 Gastro-esophageal reflux disease without esophagitis: Secondary | ICD-10-CM | POA: Diagnosis not present

## 2018-01-27 DIAGNOSIS — I10 Essential (primary) hypertension: Secondary | ICD-10-CM

## 2018-01-27 DIAGNOSIS — Z23 Encounter for immunization: Secondary | ICD-10-CM

## 2018-01-27 DIAGNOSIS — I2510 Atherosclerotic heart disease of native coronary artery without angina pectoris: Secondary | ICD-10-CM

## 2018-01-27 DIAGNOSIS — E782 Mixed hyperlipidemia: Secondary | ICD-10-CM

## 2018-01-27 LAB — LIPID PANEL
CHOL/HDL RATIO: 3
Cholesterol: 139 mg/dL (ref 0–200)
HDL: 43.4 mg/dL (ref >39.00)
LDL Cholesterol: 80 mg/dL (ref 0–99)
NONHDL: 95.56
Triglycerides: 76 mg/dL (ref 0.0–149.0)
VLDL: 15.2 mg/dL (ref 0.0–40.0)

## 2018-01-27 LAB — COMPREHENSIVE METABOLIC PANEL
ALBUMIN: 4.3 g/dL (ref 3.5–5.2)
ALK PHOS: 75 U/L (ref 39–117)
ALT: 19 U/L (ref 0–53)
AST: 27 U/L (ref 0–37)
BILIRUBIN TOTAL: 0.8 mg/dL (ref 0.2–1.2)
BUN: 33 mg/dL — ABNORMAL HIGH (ref 6–23)
CALCIUM: 10.2 mg/dL (ref 8.4–10.5)
CO2: 27 mEq/L (ref 19–32)
Chloride: 104 mEq/L (ref 96–112)
Creatinine, Ser: 1.42 mg/dL (ref 0.40–1.50)
GFR: 50.41 mL/min — AB (ref >60.00)
Glucose, Bld: 103 mg/dL — ABNORMAL HIGH (ref 70–99)
POTASSIUM: 4.8 meq/L (ref 3.5–5.1)
Sodium: 140 mEq/L (ref 135–145)
Total Protein: 6.2 g/dL (ref 6.0–8.3)

## 2018-01-27 LAB — CBC
HEMATOCRIT: 37.7 % — AB (ref 39.0–52.0)
HEMOGLOBIN: 12.7 g/dL — AB (ref 13.0–17.0)
MCHC: 33.7 g/dL (ref 30.0–36.0)
MCV: 91.3 fl (ref 78.0–100.0)
PLATELETS: 212 10*3/uL (ref 150.0–400.0)
RBC: 4.13 Mil/uL — AB (ref 4.22–5.81)
RDW: 14.2 % (ref 11.5–15.5)
WBC: 6 10*3/uL (ref 4.0–10.5)

## 2018-01-27 LAB — AMYLASE: Amylase: 41 U/L (ref 27–131)

## 2018-01-27 LAB — URINALYSIS, ROUTINE W REFLEX MICROSCOPIC
BILIRUBIN URINE: NEGATIVE
HGB URINE DIPSTICK: NEGATIVE
Leukocytes, UA: NEGATIVE
NITRITE: NEGATIVE
Specific Gravity, Urine: 1.015 (ref 1.000–1.030)
TOTAL PROTEIN, URINE-UPE24: NEGATIVE
URINE GLUCOSE: NEGATIVE
UROBILINOGEN UA: 0.2 (ref 0.0–1.0)
pH: 6 (ref 5.0–8.0)

## 2018-01-27 MED ORDER — PRAVASTATIN SODIUM 40 MG PO TABS: ORAL_TABLET | ORAL | 3 refills | Status: AC

## 2018-01-27 MED ORDER — OMEPRAZOLE 40 MG PO CPDR: 40.0000 mg | DELAYED_RELEASE_CAPSULE | Freq: Every day | ORAL | 6 refills | Status: DC

## 2018-01-27 MED ORDER — PRAVASTATIN SODIUM 40 MG PO TABS: ORAL_TABLET | ORAL | 3 refills | Status: DC

## 2018-01-27 MED ORDER — AMLODIPINE BESYLATE 5 MG PO TABS: 5.0000 mg | ORAL_TABLET | Freq: Every day | ORAL | 3 refills | Status: DC

## 2018-01-27 MED ORDER — METOPROLOL SUCCINATE ER 25 MG PO TB24: 25.0000 mg | ORAL_TABLET | Freq: Every day | ORAL | 3 refills | Status: AC

## 2018-01-27 MED ORDER — METOPROLOL SUCCINATE ER 25 MG PO TB24: 25.0000 mg | ORAL_TABLET | Freq: Every day | ORAL | 3 refills | Status: DC

## 2018-01-27 MED ORDER — OMEPRAZOLE 40 MG PO CPDR: 40.0000 mg | DELAYED_RELEASE_CAPSULE | Freq: Every day | ORAL | 6 refills | Status: AC

## 2018-01-27 NOTE — Progress Notes (Signed)
Subjective:  Patient ID: Ricardo Sloan, male    DOB: [birth year]  Age: 84 y.o. MRN: [id number]  CC: Annual Exam   HPI Ricardo Sloan presents for follow-up of his hypertension, vascular disease elevated cholesterol and GERD.  Patient's blood pressure remains well controlled trolled on the amlodipine and metoprolol.  He is having no chest pain or shortness of breath.  He remains quite active.  He is responsible for all of the outdoor and indoor household chores.  He is certain that he is exercising for at least 30 minutes 5 days a week in the care of his invalid wife who his body has been damaged by rheumatoid arthritis.  She is only some semi-ambulatory at this point and uses a wheelchair and walker most of the time.  He is having no issues with any of his medications and is taking them all.  He did see the eye doctor last week and has dental appointments every 6 months.  His bowel habits are all over the place constipation to small stooling to regular bowel movements.  He is never seen blood in his stool or melena.  This is his usual stooling pattern.  Urine flow is okay.  Outpatient Medications Prior to Visit  Medication Sig Dispense Refill  . Aspirin (ADULT ASPIRIN LOW STRENGTH) 81 MG EC tablet Take 81 mg by mouth daily.      Marland Kitchen loratadine (CLARITIN) 10 MG tablet Take 10 mg by mouth daily.      . Multiple Vitamin (MULTIVITAMIN) tablet Take 1 tablet by mouth daily.      Marland Kitchen amLODipine (NORVASC) 5 MG tablet TAKE ONE TABLET BY MOUTH DAILY 90 tablet 3  . metoprolol succinate (TOPROL-XL) 25 MG 24 hr tablet Take 1 tablet (25 mg total) by mouth daily. 90 tablet 3  . omeprazole (PRILOSEC) 40 MG capsule Take 1 capsule (40 mg total) by mouth daily. 90 capsule 6  . pravastatin (PRAVACHOL) 40 MG tablet TAKE ONE-HALF TABLET BY MOUTH EVERY NIGHT AT BEDTIME 45 tablet 3   No facility-administered medications prior to visit.     ROS Review of Systems  Constitutional: Negative.   HENT: Negative.     Eyes: Negative for photophobia and visual disturbance.  Respiratory: Negative.   Cardiovascular: Negative.   Endocrine: Negative for polyphagia and polyuria.  Genitourinary: Negative.   Musculoskeletal: Negative for gait problem and joint swelling.  Skin: Negative.   Allergic/Immunologic: Negative for immunocompromised state.  Neurological: Negative for weakness and headaches.  Hematological: Does not bruise/bleed easily.  Psychiatric/Behavioral: Negative.     Objective:  BP 118/70 (BP Location: Left Arm, Patient Position: Sitting, Cuff Size: Normal)   Pulse 61   Ht 5\' 9"  (1.753 m)   Wt 158 lb 6 oz (71.8 kg)   SpO2 97%   BMI 23.39 kg/m   BP Readings from Last 3 Encounters:  01/27/18 118/70  12/10/17 136/82  10/30/17 130/82    Wt Readings from Last 3 Encounters:  01/27/18 158 lb 6 oz (71.8 kg)  12/10/17 160 lb (72.6 kg)  10/30/17 160 lb (72.6 kg)    Physical Exam  Constitutional: He is oriented to person, place, and time. He appears well-developed and well-nourished. No distress.  HENT:  Head: Normocephalic and atraumatic.  Right Ear: External ear normal.  Left Ear: External ear normal.  Mouth/Throat: Oropharynx is clear and moist. No oropharyngeal exudate.  Eyes: Pupils are equal, round, and reactive to light. Conjunctivae and EOM are normal. Right eye exhibits no  discharge. Left eye exhibits no discharge. No scleral icterus.  Neck: Neck supple. No JVD present. No tracheal deviation present. No thyromegaly present.  Cardiovascular: Normal rate, regular rhythm and normal heart sounds.  Pulmonary/Chest: Effort normal and breath sounds normal.  Abdominal: Bowel sounds are normal.  Musculoskeletal: He exhibits no edema.  Lymphadenopathy:    He has no cervical adenopathy.  Neurological: He is alert and oriented to person, place, and time.  Skin: Skin is warm and dry. Capillary refill takes less than 2 seconds. He is not diaphoretic. No erythema. No pallor.   Psychiatric: He has a normal mood and affect. His behavior is normal.    Lab Results  Component Value Date   WBC 5.3 08/13/2017   HGB 13.2 08/13/2017   HCT 39.3 08/13/2017   PLT 213.0 08/13/2017   GLUCOSE 95 08/13/2017   CHOL 147 08/13/2017   TRIG 76.0 08/13/2017   HDL 44.30 08/13/2017   LDLCALC 87 08/13/2017   ALT 20 08/13/2017   AST 26 08/13/2017   NA 138 08/13/2017   K 4.1 08/13/2017   CL 102 08/13/2017   CREATININE 1.17 08/13/2017   BUN 28 (H) 08/13/2017   CO2 26 08/13/2017   TSH 2.74 08/13/2017   PSA 0.98 08/13/2017   INR 1.0 ratio 07/28/2009   HGBA1C 6.1 06/15/2014   MICROALBUR <0.7 08/13/2017    Dg Chest 2 View  Result Date: 04/25/2017 CLINICAL DATA:  Routine, chest congestion x 1-2 yrs only in morning, hx of HTN, PNA, lung nodules, bronchiectasis. EXAM: CHEST  2 VIEW COMPARISON:  Chest x-ray dated 03/29/2015. FINDINGS: Heart size is normal. Overall cardiomediastinal silhouette is stable. Atherosclerotic changes again noted at the aortic arch. Lungs are hyperexpanded. Coarse lung markings bilaterally are stable, indicating chronic interstitial lung disease. Chronic bronchitic changes noted centrally. No confluent opacity to suggest a developing pneumonia. No pulmonary nodule or mass appreciated. No pleural effusion or pneumothorax seen. Mild degenerative spurring within the thoracic spine. No acute or suspicious osseous finding. IMPRESSION: 1. Lungs are hyperexpanded indicating COPD. 2. Chronic interstitial lung disease and chronic bronchitic changes. 3. Aortic atherosclerosis. 4. No acute findings.  No evidence of pneumonia or pulmonary edema. Electronically Signed   By: Franki Cabot M.D.   On: 04/25/2017 20:34    Assessment & Plan:   Armond was seen today for annual exam.  Diagnoses and all orders for this visit:  Essential hypertension -     amLODipine (NORVASC) 5 MG tablet; Take 1 tablet (5 mg total) by mouth daily. -     metoprolol succinate (TOPROL-XL) 25 MG  24 hr tablet; Take 1 tablet (25 mg total) by mouth daily. -     CBC -     Comprehensive metabolic panel -     Urinalysis, Routine w reflex microscopic  Need for diphtheria-tetanus-pertussis (Tdap) vaccine -     Tdap vaccine greater than or equal to 7yo IM  ASCVD (arteriosclerotic cardiovascular disease) -     pravastatin (PRAVACHOL) 40 MG tablet; TAKE ONE-HALF TABLET BY MOUTH EVERY NIGHT AT BEDTIME -     Comprehensive metabolic panel -     Lipid panel  Mixed hyperlipidemia -     pravastatin (PRAVACHOL) 40 MG tablet; TAKE ONE-HALF TABLET BY MOUTH EVERY NIGHT AT BEDTIME -     Comprehensive metabolic panel -     Lipid panel  Gastroesophageal reflux disease, esophagitis presence not specified -     omeprazole (PRILOSEC) 40 MG capsule; Take 1 capsule (40 mg total)  by mouth daily. -     Amylase   I have changed Bradly Bienenstock. [place]'s amLODipine. I am also having him maintain his Aspirin, loratadine, multivitamin, metoprolol succinate, omeprazole, and pravastatin.  Meds ordered this encounter  Medications  . amLODipine (NORVASC) 5 MG tablet    Sig: Take 1 tablet (5 mg total) by mouth daily.    Dispense:  90 tablet    Refill:  3  . metoprolol succinate (TOPROL-XL) 25 MG 24 hr tablet    Sig: Take 1 tablet (25 mg total) by mouth daily.    Dispense:  90 tablet    Refill:  3  . omeprazole (PRILOSEC) 40 MG capsule    Sig: Take 1 capsule (40 mg total) by mouth daily.    Dispense:  90 capsule    Refill:  6  . pravastatin (PRAVACHOL) 40 MG tablet    Sig: TAKE ONE-HALF TABLET BY MOUTH EVERY NIGHT AT BEDTIME    Dispense:  45 tablet    Refill:  3    D/c last RX     Follow-up: Return in about 6 months (around 07/28/2018).  Libby Maw, MD

## 2018-01-27 NOTE — Addendum Note (Signed)
Addended by: Kateri Mc E on: 01/27/2018 11:37 AM   Modules accepted: Orders

## 2018-05-28 DIAGNOSIS — R69 Illness, unspecified: Secondary | ICD-10-CM | POA: Diagnosis not present

## 2018-07-28 ENCOUNTER — Encounter: Payer: Self-pay | Admitting: Family Medicine

## 2018-07-28 ENCOUNTER — Ambulatory Visit (INDEPENDENT_AMBULATORY_CARE_PROVIDER_SITE_OTHER): Payer: Medicare HMO | Admitting: Family Medicine

## 2018-07-28 VITALS — BP 129/68 | HR 52 | Ht 69.0 in | Wt 159.0 lb

## 2018-07-28 DIAGNOSIS — D649 Anemia, unspecified: Secondary | ICD-10-CM

## 2018-07-28 DIAGNOSIS — E782 Mixed hyperlipidemia: Secondary | ICD-10-CM

## 2018-07-28 DIAGNOSIS — I10 Essential (primary) hypertension: Secondary | ICD-10-CM | POA: Diagnosis not present

## 2018-07-28 DIAGNOSIS — I2510 Atherosclerotic heart disease of native coronary artery without angina pectoris: Secondary | ICD-10-CM

## 2018-07-28 NOTE — Progress Notes (Signed)
Virtual Visit via Telephone Note  I connected with Ricardo Sloan on 07/28/18 at 10:30 AM EDT by telephone and verified that I am speaking with the correct person using two identifiers.  Location: Patient: home  Provider:    Established Patient Office Visit  Subjective:  Patient ID: Ricardo Sloan, male    DOB: January 20, 1932  Age: 85 y.o. MRN: [id number]  CC:  Chief Complaint  Patient presents with  . Follow-up    HPI Ricardo Sloan presents for follow-up of his hypertension and coronary artery disease.  Blood pressure has been well controlled.  His pulse rate runs on the low side which is probably due to the metoprolol but he denies any lightheadedness or dizziness when standing exercise tolerance remains good.  He is active on his yard on a practically daily basis.  He is experienced tingling in his hands.  Blood work last visit showed a mild anemia was normocytic.  He has been taking a multivitamin and high-dose B complex.  He denies any blood in the stools, melena or blood in the urine.  Continues to take Pravachol without issue.  Continues to be the primary caregiver for his invalid wife.  Chart review shows normal B12 and TSH levels drawn in May of last year.  Past Medical History:  Diagnosis Date  . BPH (benign prostatic hypertrophy)   . Colon polyps   . GERD (gastroesophageal reflux disease)    Upper endoscopy 2003, Dr. Velora Heckler  . Hemorrhoids   . Hiatal hernia 2010  . Hyperlipidemia   . Hypertension   . Internal hemorrhoids   . Microscopic hematuria 2001   Dr.  Luanne Bras  . Pneumonia     X 2 in high school    Past Surgical History:  Procedure Laterality Date  . COLONOSCOPY  [id number]   Internal hemorrhoids, Dr. Velora Heckler  . NOSE SURGERY     Submucosal resection in the 1960s  . TONSILLECTOMY        . UPPER GASTROINTESTINAL ENDOSCOPY  2003   GERD  . WISDOM TOOTH EXTRACTION      Family History  Problem Relation Age of Onset  . Diabetes Mother   .  Subarachnoid hemorrhage Daughter   . Lung cancer Brother        smoker  . Other Paternal Grandfather        typhoid  . Heart disease Neg Hx   . Stroke Neg Hx   . Hypertension Neg Hx   . Colon cancer Neg Hx     Social History   Socioeconomic History  . Marital status: Married    Spouse name: Not on file  . Number of children: Not on file  . Years of education: Not on file  . Highest education level: Not on file  Occupational History  . Not on file  Social Needs  . Financial resource strain: Not on file  . Food insecurity:    Worry: Not on file    Inability: Not on file  . Transportation needs:    Medical: Not on file    Non-medical: Not on file  Tobacco Use  . Smoking status: Former Smoker    Packs/day: 0.75    Years: 13.00    Pack years: 9.75    Types: Cigarettes, Pipe    Last attempt to quit: 03/26/1965    Years since quitting: 53.3  . Smokeless tobacco: Never Used  . Tobacco comment: smoked 1953-1968, up to 1 ppd  Substance and Sexual  Activity  . Alcohol use: Yes    Comment:  rarely socially; 1-2 / month  . Drug use: No  . Sexual activity: Not on file  Lifestyle  . Physical activity:    Days per week: Not on file    Minutes per session: Not on file  . Stress: Not on file  Relationships  . Social connections:    Talks on phone: Not on file    Gets together: Not on file    Attends religious service: Not on file    Active member of club or organization: Not on file    Attends meetings of clubs or organizations: Not on file    Relationship status: Not on file  . Intimate partner violence:    Fear of current or ex partner: Not on file    Emotionally abused: Not on file    Physically abused: Not on file    Forced sexual activity: Not on file  Other Topics Concern  . Not on file  Social History Narrative   Daily caffeine    Daily care of wife; becoming more disabled; but still cooks;    Stress 1-10; 2      epworth sleepiness scale = 10 (11/02/2015)    Fun/[place]: [place]   Denies abuse and feels safe at home.     Outpatient Medications Prior to Visit  Medication Sig Dispense Refill  . amLODipine (NORVASC) 5 MG tablet Take 1 tablet (5 mg total) by mouth daily. 90 tablet 3  . Aspirin (ADULT ASPIRIN LOW STRENGTH) 81 MG EC tablet Take 81 mg by mouth daily.      Marland Kitchen loratadine (CLARITIN) 10 MG tablet Take 10 mg by mouth daily.      . metoprolol succinate (TOPROL-XL) 25 MG 24 hr tablet Take 1 tablet (25 mg total) by mouth daily. 90 tablet 3  . Multiple Vitamin (MULTIVITAMIN) tablet Take 1 tablet by mouth daily.      Marland Kitchen omeprazole (PRILOSEC) 40 MG capsule Take 1 capsule (40 mg total) by mouth daily. 90 capsule 6  . pravastatin (PRAVACHOL) 40 MG tablet TAKE ONE-HALF TABLET BY MOUTH EVERY NIGHT AT BEDTIME 45 tablet 3   No facility-administered medications prior to visit.     No Known Allergies  ROS Review of Systems  Constitutional: Negative for fatigue, fever and unexpected weight change.  Eyes: Negative for photophobia and visual disturbance.  Respiratory: Negative for apnea, shortness of breath and wheezing.   Cardiovascular: Negative for chest pain.  Gastrointestinal: Negative.  Negative for abdominal pain, anal bleeding and blood in stool.  Genitourinary: Negative for hematuria.  Neurological: Negative for dizziness, light-headedness and headaches.  Hematological: Does not bruise/bleed easily.  Psychiatric/Behavioral: Negative.       Objective:    Physical Exam  Constitutional: He is oriented to person, place, and time. No distress.  Pulmonary/Chest: Effort normal.  Neurological: He is alert and oriented to person, place, and time.  Psychiatric: He has a normal mood and affect. His behavior is normal.    BP 129/68   Pulse (!) 52   Ht 5\' 9"  (1.753 m)   Wt 159 lb (72.1 kg)   BMI 23.48 kg/m  Wt Readings from Last 3 Encounters:  07/28/18 159 lb (72.1 kg)  01/27/18 158 lb 6 oz (71.8 kg)  12/10/17 160 lb (72.6 kg)     There  are no preventive care reminders to display for this patient.  There are no preventive care reminders to display for this patient.  Lab Results  Component Value Date   TSH 2.74 08/13/2017   Lab Results  Component Value Date   WBC 6.0 01/27/2018   HGB 12.7 (L) 01/27/2018   HCT 37.7 (L) 01/27/2018   MCV 91.3 01/27/2018   PLT 212.0 01/27/2018   Lab Results  Component Value Date   NA 140 01/27/2018   K 4.8 01/27/2018   CO2 27 01/27/2018   GLUCOSE 103 (H) 01/27/2018   BUN 33 (H) 01/27/2018   CREATININE 1.42 01/27/2018   BILITOT 0.8 01/27/2018   ALKPHOS 75 01/27/2018   AST 27 01/27/2018   ALT 19 01/27/2018   PROT 6.2 01/27/2018   ALBUMIN 4.3 01/27/2018   CALCIUM 10.2 01/27/2018   GFR 50.41 (L) 01/27/2018   Lab Results  Component Value Date   CHOL 139 01/27/2018   Lab Results  Component Value Date   HDL 43.40 01/27/2018   Lab Results  Component Value Date   LDLCALC 80 01/27/2018   Lab Results  Component Value Date   TRIG 76.0 01/27/2018   Lab Results  Component Value Date   CHOLHDL 3 01/27/2018   Lab Results  Component Value Date   HGBA1C 6.1 06/15/2014      Assessment & Plan:   Problem List Items Addressed This Visit      Cardiovascular and Mediastinum   Essential hypertension - Primary   Relevant Orders   Comprehensive metabolic panel   ASCVD (arteriosclerotic cardiovascular disease)     Other   Mixed hyperlipidemia   Relevant Orders   Comprehensive metabolic panel   Lipid panel   Anemia   Relevant Orders   CBC   Iron, TIBC and Ferritin Panel      No orders of the defined types were placed in this encounter.   Follow-up: No follow-ups on file.    Libby Maw, MD   I discussed the limitations, risks, security and privacy concerns of performing an evaluation and management service by telephone and the availability of in person appointments. I also discussed with the patient that there may be a patient responsible charge  related to this service. The patient expressed understanding and agreed to proceed.  Interactive video and audio telecommunications were attempted between myself and the patient. However they failed due to the patient having technical difficulties or not having access to video capability. We continued and completed with audio only.   History of Present Illness:    Observations/Objective:   Assessment and Plan:   Follow Up Instructions:    I discussed the assessment and treatment plan with the patient. The patient was provided an opportunity to ask questions and all were answered. The patient agreed with the plan and demonstrated an understanding of the instructions.   The patient was advised to call back or seek an in-person evaluation if the symptoms worsen or if the condition fails to improve as anticipated.  I provided  20 minutes of non-face-to-face time during this encounter.  Patient will follow-up for a lab appointment and then to see me about the tingling in his hands.

## 2018-11-05 ENCOUNTER — Ambulatory Visit: Payer: Medicare HMO | Admitting: *Deleted

## 2018-11-21 ENCOUNTER — Other Ambulatory Visit: Payer: Self-pay

## 2018-11-21 DIAGNOSIS — Z20822 Contact with and (suspected) exposure to covid-19: Secondary | ICD-10-CM

## 2018-11-21 DIAGNOSIS — R6889 Other general symptoms and signs: Secondary | ICD-10-CM | POA: Diagnosis not present

## 2018-11-22 LAB — NOVEL CORONAVIRUS, NAA: SARS-CoV-2, NAA: NOT DETECTED

## 2018-11-24 ENCOUNTER — Ambulatory Visit (INDEPENDENT_AMBULATORY_CARE_PROVIDER_SITE_OTHER): Payer: Medicare HMO | Admitting: Family Medicine

## 2018-11-24 ENCOUNTER — Encounter: Payer: Self-pay | Admitting: Family Medicine

## 2018-11-24 DIAGNOSIS — R109 Unspecified abdominal pain: Secondary | ICD-10-CM | POA: Diagnosis not present

## 2018-11-24 DIAGNOSIS — R5383 Other fatigue: Secondary | ICD-10-CM | POA: Diagnosis not present

## 2018-11-24 LAB — CBC
HCT: 37.4 % — ABNORMAL LOW (ref 39.0–52.0)
Hemoglobin: 12.4 g/dL — ABNORMAL LOW (ref 13.0–17.0)
MCHC: 33 g/dL (ref 30.0–36.0)
MCV: 91.4 fl (ref 78.0–100.0)
Platelets: 246 10*3/uL (ref 150.0–400.0)
RBC: 4.09 Mil/uL — ABNORMAL LOW (ref 4.22–5.81)
RDW: 13.8 % (ref 11.5–15.5)
WBC: 9 10*3/uL (ref 4.0–10.5)

## 2018-11-24 LAB — COMPREHENSIVE METABOLIC PANEL
ALT: 19 U/L (ref 0–53)
AST: 21 U/L (ref 0–37)
Albumin: 3.8 g/dL (ref 3.5–5.2)
Alkaline Phosphatase: 80 U/L (ref 39–117)
BUN: 39 mg/dL — ABNORMAL HIGH (ref 6–23)
CO2: 26 mEq/L (ref 19–32)
Calcium: 12 mg/dL — ABNORMAL HIGH (ref 8.4–10.5)
Chloride: 102 mEq/L (ref 96–112)
Creatinine, Ser: 2.48 mg/dL — ABNORMAL HIGH (ref 0.40–1.50)
GFR: 24.87 mL/min — ABNORMAL LOW (ref >60.00)
Glucose, Bld: 126 mg/dL — ABNORMAL HIGH (ref 70–99)
Potassium: 3.7 mEq/L (ref 3.5–5.1)
Sodium: 137 mEq/L (ref 135–145)
Total Bilirubin: 0.6 mg/dL (ref 0.2–1.2)
Total Protein: 6.3 g/dL (ref 6.0–8.3)

## 2018-11-24 NOTE — Progress Notes (Signed)
Established Patient Office Visit  Subjective:  Patient ID: Ricardo Sloan, male    DOB: 09-05-1931  Age: 85 y.o. MRN: [id number]  CC:  Chief Complaint  Patient presents with  . loss of taste  . Nausea    HPI JOHNTAVIS STITZ presents for with a 3-day history of pain in his left flank and on the left side of his abdomen.  He has had one episode of nausea and vomiting.  Decreased appetite persist.  There is been no fever or chills or blood in his urine or stool.  He has no history of kidney stone, diverticulosis or diverticulitis.  There have been no changes in his urine flow frequency urgency or nocturia.  Continues to take Pravachol, amlodipine and metoprolol without issue.  Out of concern he had himself checked for coronavirus on Saturday and that test came back negative.  Continues to shelter at home with his wife.  Past Medical History:  Diagnosis Date  . BPH (benign prostatic hypertrophy)   . Colon polyps   . GERD (gastroesophageal reflux disease)    Upper endoscopy 2003, Dr. Velora Heckler  . Hemorrhoids   . Hiatal hernia 2010  . Hyperlipidemia   . Hypertension   . Internal hemorrhoids   . Microscopic hematuria 2001   Dr.  Luanne Bras  . Pneumonia     X 2 in high school    Past Surgical History:  Procedure Laterality Date  . COLONOSCOPY  [id number]   Internal hemorrhoids, Dr. Velora Heckler  . NOSE SURGERY     Submucosal resection in the 1960s  . TONSILLECTOMY        . UPPER GASTROINTESTINAL ENDOSCOPY  2003   GERD  . WISDOM TOOTH EXTRACTION      Family History  Problem Relation Age of Onset  . Diabetes Mother   . Subarachnoid hemorrhage Daughter   . Lung cancer Brother        smoker  . Other Paternal Grandfather        typhoid  . Heart disease Neg Hx   . Stroke Neg Hx   . Hypertension Neg Hx   . Colon cancer Neg Hx     Social History   Socioeconomic History  . Marital status: Married    Spouse name: Not on file  . Number of children: Not on file  .  Years of education: Not on file  . Highest education level: Not on file  Occupational History  . Not on file  Social Needs  . Financial resource strain: Not on file  . Food insecurity    Worry: Not on file    Inability: Not on file  . Transportation needs    Medical: Not on file    Non-medical: Not on file  Tobacco Use  . Smoking status: Former Smoker    Packs/day: 0.75    Years: 13.00    Pack years: 9.75    Types: Cigarettes, Pipe    Quit date: 03/26/1965    Years since quitting: 53.7  . Smokeless tobacco: Never Used  . Tobacco comment: smoked 1953-1968, up to 1 ppd  Substance and Sexual Activity  . Alcohol use: Yes    Comment:  rarely socially; 1-2 / month  . Drug use: No  . Sexual activity: Not on file  Lifestyle  . Physical activity    Days per week: Not on file    Minutes per session: Not on file  . Stress: Not on file  Relationships  .  Social Herbalist on phone: Not on file    Gets together: Not on file    Attends religious service: Not on file    Active member of club or organization: Not on file    Attends meetings of clubs or organizations: Not on file    Relationship status: Not on file  . Intimate partner violence    Fear of current or ex partner: Not on file    Emotionally abused: Not on file    Physically abused: Not on file    Forced sexual activity: Not on file  Other Topics Concern  . Not on file  Social History Narrative   Daily caffeine    Daily care of wife; becoming more disabled; but still cooks;    Stress 1-10; 2      epworth sleepiness scale = 10 (11/02/2015)   Fun/Hobbu: Garden   Denies abuse and feels safe at home.     Outpatient Medications Prior to Visit  Medication Sig Dispense Refill  . amLODipine (NORVASC) 5 MG tablet Take 1 tablet (5 mg total) by mouth daily. 90 tablet 3  . Aspirin (ADULT ASPIRIN LOW STRENGTH) 81 MG EC tablet Take 81 mg by mouth daily.      Marland Kitchen loratadine (CLARITIN) 10 MG tablet Take 10 mg by mouth  daily.      . metoprolol succinate (TOPROL-XL) 25 MG 24 hr tablet Take 1 tablet (25 mg total) by mouth daily. 90 tablet 3  . Multiple Vitamin (MULTIVITAMIN) tablet Take 1 tablet by mouth daily.      Marland Kitchen omeprazole (PRILOSEC) 40 MG capsule Take 1 capsule (40 mg total) by mouth daily. 90 capsule 6  . pravastatin (PRAVACHOL) 40 MG tablet TAKE ONE-HALF TABLET BY MOUTH EVERY NIGHT AT BEDTIME 45 tablet 3   No facility-administered medications prior to visit.     No Known Allergies  ROS Review of Systems  Constitutional: Negative for chills, diaphoresis, fatigue, fever and unexpected weight change.  HENT: Negative.   Eyes: Negative for photophobia and visual disturbance.  Respiratory: Negative.   Cardiovascular: Negative.   Gastrointestinal: Positive for abdominal pain, nausea and vomiting. Negative for abdominal distention, anal bleeding, blood in stool, constipation and diarrhea.  Endocrine: Negative for polyphagia and polyuria.  Genitourinary: Negative for difficulty urinating, discharge, dysuria, frequency, hematuria, testicular pain and urgency.  Musculoskeletal: Negative for arthralgias and myalgias.  Skin: Negative for pallor and rash.  Allergic/Immunologic: Negative for immunocompromised state.  Neurological: Negative for headaches.  Hematological: Does not bruise/bleed easily.  Psychiatric/Behavioral: Negative.       Objective:    Physical Exam  Constitutional: He is oriented to person, place, and time. No distress.  Pulmonary/Chest: Effort normal.  Neurological: He is alert and oriented to person, place, and time.  Skin: Skin is warm and dry.  Psychiatric: He has a normal mood and affect. His behavior is normal.    There were no vitals taken for this visit. Wt Readings from Last 3 Encounters:  07/28/18 159 lb (72.1 kg)  01/27/18 158 lb 6 oz (71.8 kg)  12/10/17 160 lb (72.6 kg)   BP Readings from Last 3 Encounters:  07/28/18 129/68  01/27/18 118/70  12/10/17 136/82    Guideline developer:  UpToDate (see UpToDate for funding source) Date Released: June 2014  Health Maintenance Due  Topic Date Due  . INFLUENZA VACCINE  10/25/2018    There are no preventive care reminders to display for this patient.  Lab Results  Component Value Date   TSH 2.74 08/13/2017   Lab Results  Component Value Date   WBC 6.0 01/27/2018   HGB 12.7 (L) 01/27/2018   HCT 37.7 (L) 01/27/2018   MCV 91.3 01/27/2018   PLT 212.0 01/27/2018   Lab Results  Component Value Date   NA 140 01/27/2018   K 4.8 01/27/2018   CO2 27 01/27/2018   GLUCOSE 103 (H) 01/27/2018   BUN 33 (H) 01/27/2018   CREATININE 1.42 01/27/2018   BILITOT 0.8 01/27/2018   ALKPHOS 75 01/27/2018   AST 27 01/27/2018   ALT 19 01/27/2018   PROT 6.2 01/27/2018   ALBUMIN 4.3 01/27/2018   CALCIUM 10.2 01/27/2018   GFR 50.41 (L) 01/27/2018   Lab Results  Component Value Date   CHOL 139 01/27/2018   Lab Results  Component Value Date   HDL 43.40 01/27/2018   Lab Results  Component Value Date   LDLCALC 80 01/27/2018   Lab Results  Component Value Date   TRIG 76.0 01/27/2018   Lab Results  Component Value Date   CHOLHDL 3 01/27/2018   Lab Results  Component Value Date   HGBA1C 6.1 06/15/2014      Assessment & Plan:   Problem List Items Addressed This Visit    None    Visit Diagnoses    Fatigue, unspecified type    -  Primary   Abdominal pain, unspecified abdominal location       Relevant Orders   Urinalysis, Routine w reflex microscopic   Urine Culture   CBC   Comprehensive metabolic panel      No orders of the defined types were placed in this encounter.   Follow-up: No follow-ups on file.   Patient will come in later today to have above labs checked.  He will proceed to the emergency room with any increased signs and symptoms over the next few days.  Interactive video and audio telecommunications were attempted between myself and the patient. However they failed due to  the patient having technical difficulties or not having access to video capability. We continued and completed with audio only.   Virtual Visit via Video Note  I connected with Ricardo Sloan on 11/24/18 at 10:30 AM EDT by a video enabled telemedicine application and verified that I am speaking with the correct person using two identifiers.  Location: Patient: home Provider:    I discussed the limitations of evaluation and management by telemedicine and the availability of in person appointments. The patient expressed understanding and agreed to proceed.  History of Present Illness:    Observations/Objective:   Assessment and Plan:   Follow Up Instructions:    I discussed the assessment and treatment plan with the patient. The patient was provided an opportunity to ask questions and all were answered. The patient agreed with the plan and demonstrated an understanding of the instructions.   The patient was advised to call back or seek an in-person evaluation if the symptoms worsen or if the condition fails to improve as anticipated.  I provided 20 minutes of non-face-to-face time during this encounter.   Libby Maw, MD

## 2018-11-25 LAB — URINALYSIS, ROUTINE W REFLEX MICROSCOPIC
Bilirubin Urine: NEGATIVE
Hgb urine dipstick: NEGATIVE
Ketones, ur: NEGATIVE
Leukocytes,Ua: NEGATIVE
Nitrite: NEGATIVE
RBC / HPF: NONE SEEN (ref 0–?)
Specific Gravity, Urine: 1.025 (ref 1.000–1.030)
Total Protein, Urine: NEGATIVE
Urine Glucose: NEGATIVE
Urobilinogen, UA: 0.2 (ref 0.0–1.0)
WBC, UA: NONE SEEN (ref 0–?)
pH: 5 (ref 5.0–8.0)

## 2018-11-26 LAB — URINE CULTURE
MICRO NUMBER:: 829439
Result:: NO GROWTH
SPECIMEN QUALITY:: ADEQUATE

## 2018-11-27 ENCOUNTER — Encounter: Payer: Self-pay | Admitting: Family Medicine

## 2018-11-27 ENCOUNTER — Other Ambulatory Visit: Payer: Self-pay

## 2018-11-27 ENCOUNTER — Ambulatory Visit (INDEPENDENT_AMBULATORY_CARE_PROVIDER_SITE_OTHER): Payer: Medicare HMO | Admitting: Family Medicine

## 2018-11-27 VITALS — BP 100/70 | HR 82 | Temp 98.9°F | Ht 69.0 in | Wt 155.0 lb

## 2018-11-27 DIAGNOSIS — R5383 Other fatigue: Secondary | ICD-10-CM

## 2018-11-27 DIAGNOSIS — Z23 Encounter for immunization: Secondary | ICD-10-CM | POA: Diagnosis not present

## 2018-11-27 DIAGNOSIS — I959 Hypotension, unspecified: Secondary | ICD-10-CM

## 2018-11-27 DIAGNOSIS — R944 Abnormal results of kidney function studies: Secondary | ICD-10-CM | POA: Diagnosis not present

## 2018-11-27 NOTE — Progress Notes (Signed)
Established Patient Office Visit  Subjective:  Patient ID: Ricardo Sloan, male    DOB: [birth year]  Age: 85 y.o. MRN: [id number]  CC:  Chief Complaint  Patient presents with  . Follow-up    HPI Ricardo Sloan presents for follow-up of his fatigue and gastroenteritis type symptoms.  He is feeling much better and his appetite has returned.  Assures me that he has been hydrating well.  He is no longer having any nausea vomiting or loose stool.  Continues to be stressed caring for his wife and her issues.  Patient continues to feel a little washed out.  Urine flow has been good and it is straw-colored.  Past Medical History:  Diagnosis Date  . BPH (benign prostatic hypertrophy)   . Colon polyps   . GERD (gastroesophageal reflux disease)    Upper endoscopy 2003, Dr. Velora Heckler  . Hemorrhoids   . Hiatal hernia 2010  . Hyperlipidemia   . Hypertension   . Internal hemorrhoids   . Microscopic hematuria 2001   Dr.  Luanne Bras  . Pneumonia     X 2 in high school    Past Surgical History:  Procedure Laterality Date  . COLONOSCOPY  [id number]   Internal hemorrhoids, Dr. Velora Heckler  . NOSE SURGERY     Submucosal resection in the 1960s  . TONSILLECTOMY        . UPPER GASTROINTESTINAL ENDOSCOPY  2003   GERD  . WISDOM TOOTH EXTRACTION      Family History  Problem Relation Age of Onset  . Diabetes Mother   . Subarachnoid hemorrhage Daughter   . Lung cancer Brother        smoker  . Other Paternal Grandfather        typhoid  . Heart disease Neg Hx   . Stroke Neg Hx   . Hypertension Neg Hx   . Colon cancer Neg Hx     Social History   Socioeconomic History  . Marital status: Married    Spouse name: Not on file  . Number of children: Not on file  . Years of education: Not on file  . Highest education level: Not on file  Occupational History  . Not on file  Social Needs  . Financial resource strain: Not on file  . Food insecurity    Worry: Not on file   Inability: Not on file  . Transportation needs    Medical: Not on file    Non-medical: Not on file  Tobacco Use  . Smoking status: Former Smoker    Packs/day: 0.75    Years: 13.00    Pack years: 9.75    Types: Cigarettes, Pipe    Quit date: 03/26/1965    Years since quitting: 53.7  . Smokeless tobacco: Never Used  . Tobacco comment: smoked 1953-1968, up to 1 ppd  Substance and Sexual Activity  . Alcohol use: Yes    Comment:  rarely socially; 1-2 / month  . Drug use: No  . Sexual activity: Not on file  Lifestyle  . Physical activity    Days per week: Not on file    Minutes per session: Not on file  . Stress: Not on file  Relationships  . Social Herbalist on phone: Not on file    Gets together: Not on file    Attends religious service: Not on file    Active member of club or organization: Not on file    Attends meetings  of clubs or organizations: Not on file    Relationship status: Not on file  . Intimate partner violence    Fear of current or ex partner: Not on file    Emotionally abused: Not on file    Physically abused: Not on file    Forced sexual activity: Not on file  Other Topics Concern  . Not on file  Social History Narrative   Daily caffeine    Daily care of wife; becoming more disabled; but still cooks;    Stress 1-10; 2      epworth sleepiness scale = 10 (11/02/2015)   Fun/Hobbu: Garden   Denies abuse and feels safe at home.     Outpatient Medications Prior to Visit  Medication Sig Dispense Refill  . amLODipine (NORVASC) 5 MG tablet Take 1 tablet (5 mg total) by mouth daily. 90 tablet 3  . Aspirin (ADULT ASPIRIN LOW STRENGTH) 81 MG EC tablet Take 81 mg by mouth daily.      Marland Kitchen loratadine (CLARITIN) 10 MG tablet Take 10 mg by mouth daily.      . metoprolol succinate (TOPROL-XL) 25 MG 24 hr tablet Take 1 tablet (25 mg total) by mouth daily. 90 tablet 3  . Multiple Vitamin (MULTIVITAMIN) tablet Take 1 tablet by mouth daily.      Marland Kitchen omeprazole  (PRILOSEC) 40 MG capsule Take 1 capsule (40 mg total) by mouth daily. 90 capsule 6  . pravastatin (PRAVACHOL) 40 MG tablet TAKE ONE-HALF TABLET BY MOUTH EVERY NIGHT AT BEDTIME 45 tablet 3   No facility-administered medications prior to visit.     No Known Allergies  ROS Review of Systems  Constitutional: Negative for chills, diaphoresis, fatigue, fever and unexpected weight change.  HENT: Negative.   Eyes: Negative for photophobia and visual disturbance.  Respiratory: Negative.   Cardiovascular: Negative.   Gastrointestinal: Negative for abdominal pain, nausea and vomiting.  Endocrine: Negative for polyphagia and polyuria.  Genitourinary: Negative for decreased urine volume, difficulty urinating, frequency and hematuria.  Musculoskeletal: Negative for gait problem and joint swelling.  Skin: Negative for pallor and rash.  Allergic/Immunologic: Negative for immunocompromised state.  Neurological: Negative for light-headedness and numbness.  Hematological: Does not bruise/bleed easily.  Psychiatric/Behavioral: Negative.       Objective:    Physical Exam  Constitutional: He is oriented to person, place, and time. He appears well-developed and well-nourished. No distress.  HENT:  Head: Normocephalic and atraumatic.  Right Ear: External ear normal.  Left Ear: External ear normal.  Eyes: Conjunctivae are normal. Right eye exhibits no discharge. Left eye exhibits no discharge. No scleral icterus.  Neck: No JVD present. No tracheal deviation present.  Cardiovascular: Normal rate, regular rhythm and normal heart sounds.  Pulmonary/Chest: Effort normal and breath sounds normal. No stridor.  Abdominal: Bowel sounds are normal. He exhibits no distension. There is no abdominal tenderness. There is no rebound and no guarding.  Musculoskeletal:        General: No edema.  Neurological: He is alert and oriented to person, place, and time.  Skin: Skin is warm and dry. He is not diaphoretic.   Psychiatric: He has a normal mood and affect. His behavior is normal.    BP 100/70   Pulse 82   Temp 98.9 F (37.2 C) (Temporal)   Ht 5\' 9"  (1.753 m)   Wt 155 lb (70.3 kg)   SpO2 96%   BMI 22.89 kg/m  Wt Readings from Last 3 Encounters:  11/27/18 155 lb (  70.3 kg)  07/28/18 159 lb (72.1 kg)  01/27/18 158 lb 6 oz (71.8 kg)   BP Readings from Last 3 Encounters:  11/27/18 100/70  07/28/18 129/68  01/27/18 118/70   Guideline developer:  UpToDate (see UpToDate for funding source) Date Released: June 2014  Health Maintenance Due  Topic Date Due  . INFLUENZA VACCINE  10/25/2018    There are no preventive care reminders to display for this patient.  Lab Results  Component Value Date   TSH 2.74 08/13/2017   Lab Results  Component Value Date   WBC 9.0 11/24/2018   HGB 12.4 (L) 11/24/2018   HCT 37.4 (L) 11/24/2018   MCV 91.4 11/24/2018   PLT 246.0 11/24/2018   Lab Results  Component Value Date   NA 137 11/24/2018   K 3.7 11/24/2018   CO2 26 11/24/2018   GLUCOSE 126 (H) 11/24/2018   BUN 39 (H) 11/24/2018   CREATININE 2.48 (H) 11/24/2018   BILITOT 0.6 11/24/2018   ALKPHOS 80 11/24/2018   AST 21 11/24/2018   ALT 19 11/24/2018   PROT 6.3 11/24/2018   ALBUMIN 3.8 11/24/2018   CALCIUM 12.0 (H) 11/24/2018   GFR 24.87 (L) 11/24/2018   Lab Results  Component Value Date   CHOL 139 01/27/2018   Lab Results  Component Value Date   HDL 43.40 01/27/2018   Lab Results  Component Value Date   LDLCALC 80 01/27/2018   Lab Results  Component Value Date   TRIG 76.0 01/27/2018   Lab Results  Component Value Date   CHOLHDL 3 01/27/2018   Lab Results  Component Value Date   HGBA1C 6.1 06/15/2014      Assessment & Plan:   Problem List Items Addressed This Visit      Cardiovascular and Mediastinum   Hypotension   Relevant Orders   Basic metabolic panel     Other   Decreased GFR   Relevant Orders   Basic metabolic panel   Fatigue - Primary      No  orders of the defined types were placed in this encounter.   Follow-up: Return in about 1 week (around 12/04/2018), or Hold amlodipine or norvasc.   Patient will hold his amlodipine and follow-up in 1 week.  We will continue to hydrate.  Hopefully his decreased GFR will respond to hydration.

## 2018-11-28 LAB — BASIC METABOLIC PANEL
BUN: 36 mg/dL — ABNORMAL HIGH (ref 6–23)
CO2: 29 mEq/L (ref 19–32)
Calcium: 12.4 mg/dL — ABNORMAL HIGH (ref 8.4–10.5)
Chloride: 104 mEq/L (ref 96–112)
Creatinine, Ser: 2.25 mg/dL — ABNORMAL HIGH (ref 0.40–1.50)
GFR: 27.83 mL/min — ABNORMAL LOW (ref >60.00)
Glucose, Bld: 133 mg/dL — ABNORMAL HIGH (ref 70–99)
Potassium: 4.2 mEq/L (ref 3.5–5.1)
Sodium: 139 mEq/L (ref 135–145)

## 2018-12-04 ENCOUNTER — Ambulatory Visit (INDEPENDENT_AMBULATORY_CARE_PROVIDER_SITE_OTHER): Payer: Medicare HMO | Admitting: Family Medicine

## 2018-12-04 ENCOUNTER — Encounter: Payer: Self-pay | Admitting: Family Medicine

## 2018-12-04 ENCOUNTER — Other Ambulatory Visit: Payer: Self-pay

## 2018-12-04 VITALS — BP 140/78 | HR 81 | Ht 69.0 in

## 2018-12-04 DIAGNOSIS — K5900 Constipation, unspecified: Secondary | ICD-10-CM

## 2018-12-04 DIAGNOSIS — N179 Acute kidney failure, unspecified: Secondary | ICD-10-CM | POA: Diagnosis not present

## 2018-12-04 DIAGNOSIS — R5383 Other fatigue: Secondary | ICD-10-CM | POA: Diagnosis not present

## 2018-12-04 MED ORDER — POLYETHYLENE GLYCOL 3350 17 GM/SCOOP PO POWD: ORAL | 1 refills | Status: DC

## 2018-12-04 NOTE — Patient Instructions (Signed)

## 2018-12-04 NOTE — Progress Notes (Addendum)
Established Patient Office Visit  Subjective:  Patient ID: Ricardo Sloan, male    DOB: 25-Dec-1931  Age: 85 y.o. MRN: [id number]  CC:  Chief Complaint  Patient presents with  . Follow-up    HPI Ricardo Sloan presents for follow-up of his fatigue, acute kidney injury, hypotension and constipation.  Fatigue persists but it is gradually improving.  He continues to hydrate well.  Urine has been clear.  Stooling is not back to normal for him.  He has not been taking anything for him.  It sounds as though there is plenty of fiber in his diet.  Blood pressure at home last night was 160/90.  He had discontinued the Norvasc as I had recommended last visit.  He says that there is some lingering left lower back stiffness that is worse in the morning when he gets up.  He has been taking Tylenol for it with relief.  Past Medical History:  Diagnosis Date  . BPH (benign prostatic hypertrophy)   . Colon polyps   . GERD (gastroesophageal reflux disease)    Upper endoscopy 2003, Dr. Velora Heckler  . Hemorrhoids   . Hiatal hernia 2010  . Hyperlipidemia   . Hypertension   . Internal hemorrhoids   . Microscopic hematuria 2001   Dr.  Luanne Bras  . Pneumonia     X 2 in high school    Past Surgical History:  Procedure Laterality Date  . COLONOSCOPY  [id number]   Internal hemorrhoids, Dr. Velora Heckler  . NOSE SURGERY     Submucosal resection in the 1960s  . TONSILLECTOMY        . UPPER GASTROINTESTINAL ENDOSCOPY  2003   GERD  . WISDOM TOOTH EXTRACTION      Family History  Problem Relation Age of Onset  . Diabetes Mother   . Subarachnoid hemorrhage Daughter   . Lung cancer Brother        smoker  . Other Paternal Grandfather        typhoid  . Heart disease Neg Hx   . Stroke Neg Hx   . Hypertension Neg Hx   . Colon cancer Neg Hx     Social History   Socioeconomic History  . Marital status: Married    Spouse name: Not on file  . Number of children: Not on file  . Years of  education: Not on file  . Highest education level: Not on file  Occupational History  . Not on file  Social Needs  . Financial resource strain: Not on file  . Food insecurity    Worry: Not on file    Inability: Not on file  . Transportation needs    Medical: Not on file    Non-medical: Not on file  Tobacco Use  . Smoking status: Former Smoker    Packs/day: 0.75    Years: 13.00    Pack years: 9.75    Types: Cigarettes, Pipe    Quit date: 03/26/1965    Years since quitting: 53.7  . Smokeless tobacco: Never Used  . Tobacco comment: smoked 1953-1968, up to 1 ppd  Substance and Sexual Activity  . Alcohol use: Yes    Comment:  rarely socially; 1-2 / month  . Drug use: No  . Sexual activity: Not on file  Lifestyle  . Physical activity    Days per week: Not on file    Minutes per session: Not on file  . Stress: Not on file  Relationships  . Social  connections    Talks on phone: Not on file    Gets together: Not on file    Attends religious service: Not on file    Active member of club or organization: Not on file    Attends meetings of clubs or organizations: Not on file    Relationship status: Not on file  . Intimate partner violence    Fear of current or ex partner: Not on file    Emotionally abused: Not on file    Physically abused: Not on file    Forced sexual activity: Not on file  Other Topics Concern  . Not on file  Social History Narrative   Daily caffeine    Daily care of wife; becoming more disabled; but still cooks;    Stress 1-10; 2      epworth sleepiness scale = 10 (11/02/2015)   Fun/[place]: [place]   Denies abuse and feels safe at home.     Outpatient Medications Prior to Visit  Medication Sig Dispense Refill  . amLODipine (NORVASC) 5 MG tablet Take 1 tablet (5 mg total) by mouth daily. 90 tablet 3  . Aspirin (ADULT ASPIRIN LOW STRENGTH) 81 MG EC tablet Take 81 mg by mouth daily.      Marland Kitchen loratadine (CLARITIN) 10 MG tablet Take 10 mg by mouth daily.      .  metoprolol succinate (TOPROL-XL) 25 MG 24 hr tablet Take 1 tablet (25 mg total) by mouth daily. 90 tablet 3  . Multiple Vitamin (MULTIVITAMIN) tablet Take 1 tablet by mouth daily.      Marland Kitchen omeprazole (PRILOSEC) 40 MG capsule Take 1 capsule (40 mg total) by mouth daily. 90 capsule 6  . pravastatin (PRAVACHOL) 40 MG tablet TAKE ONE-HALF TABLET BY MOUTH EVERY NIGHT AT BEDTIME 45 tablet 3   No facility-administered medications prior to visit.     No Known Allergies  ROS Review of Systems  Constitutional: Positive for fatigue. Negative for diaphoresis, fever and unexpected weight change.  HENT: Negative.   Eyes: Negative for photophobia and visual disturbance.  Respiratory: Negative.   Cardiovascular: Negative.   Gastrointestinal: Negative.   Endocrine: Negative for polyphagia and polyuria.  Genitourinary: Negative.   Musculoskeletal: Negative.  Negative for joint swelling and myalgias.  Allergic/Immunologic: Negative for immunocompromised state.  Neurological: Negative for light-headedness and headaches.  Hematological: Negative.   Psychiatric/Behavioral: Negative.       Objective:    Physical Exam  Constitutional: He is oriented to person, place, and time. He appears well-developed and well-nourished. No distress.  HENT:  Head: Normocephalic and atraumatic.  Right Ear: External ear normal.  Left Ear: External ear normal.  Mouth/Throat: Oropharynx is clear and moist. No oropharyngeal exudate.  Eyes: Conjunctivae are normal. Right eye exhibits no discharge. Left eye exhibits no discharge. No scleral icterus.  Neck: No JVD present. No tracheal deviation present.  Pulmonary/Chest: Effort normal. No stridor.  Neurological: He is alert and oriented to person, place, and time.  Skin: Skin is warm and dry. He is not diaphoretic.  Psychiatric: He has a normal mood and affect. His behavior is normal.    BP 140/78   Pulse 81   Ht 5\' 9"  (1.753 m)   SpO2 96%   BMI 22.89 kg/m  Wt  Readings from Last 3 Encounters:  11/27/18 155 lb (70.3 kg)  07/28/18 159 lb (72.1 kg)  01/27/18 158 lb 6 oz (71.8 kg)   BP Readings from Last 3 Encounters:  12/04/18 140/78  11/27/18  100/70  07/28/18 129/68   Guideline developer:  UpToDate (see UpToDate for funding source) Date Released: June 2014  There are no preventive care reminders to display for this patient.  There are no preventive care reminders to display for this patient.  Lab Results  Component Value Date   TSH 2.74 08/13/2017   Lab Results  Component Value Date   WBC 9.0 11/24/2018   HGB 12.4 (L) 11/24/2018   HCT 37.4 (L) 11/24/2018   MCV 91.4 11/24/2018   PLT 246.0 11/24/2018   Lab Results  Component Value Date   NA 141 12/04/2018   K 4.9 12/04/2018   CO2 30 12/04/2018   GLUCOSE 103 (H) 12/04/2018   BUN 41 (H) 12/04/2018   CREATININE 2.78 (H) 12/04/2018   BILITOT 0.6 11/24/2018   ALKPHOS 80 11/24/2018   AST 21 11/24/2018   ALT 19 11/24/2018   PROT 6.3 11/24/2018   ALBUMIN 3.8 11/24/2018   CALCIUM 13.2 (HH) 12/04/2018   GFR 21.80 (L) 12/04/2018   Lab Results  Component Value Date   CHOL 139 01/27/2018   Lab Results  Component Value Date   HDL 43.40 01/27/2018   Lab Results  Component Value Date   LDLCALC 80 01/27/2018   Lab Results  Component Value Date   TRIG 76.0 01/27/2018   Lab Results  Component Value Date   CHOLHDL 3 01/27/2018   Lab Results  Component Value Date   HGBA1C 6.1 06/15/2014      Assessment & Plan:   Problem List Items Addressed This Visit      Genitourinary   AKI (acute kidney injury) ([place])   Relevant Orders   Basic metabolic panel (Completed)     Other   Fatigue - Primary   Constipation   Relevant Medications   polyethylene glycol powder (GLYCOLAX/MIRALAX) 17 GM/SCOOP powder   Hypercalcemia   Relevant Orders   PTH, intact (no Ca)   Protein Electrophoresis, (serum)   Protein Electrophoresis, Urine Rflx.      Meds ordered this encounter   Medications  . DISCONTD: polyethylene glycol powder (GLYCOLAX/MIRALAX) 17 GM/SCOOP powder    Sig: Mix 1 scoop daily with water until constipation is relieved.    Dispense:  255 g    Refill:  1  . polyethylene glycol powder (GLYCOLAX/MIRALAX) 17 GM/SCOOP powder    Sig: Mix 1 scoop daily with water until constipation is relieved.    Dispense:  255 g    Refill:  1    Follow-up: Return in about 2 weeks (around 12/18/2018), or restart amlodipine..    Patient will restart his amlodipine.  He will continue to hydrate and use the MiraLAX to help him stool.  Believe that dehydration is a likely cause for all of the above.

## 2018-12-04 NOTE — Addendum Note (Signed)
Addended by: Rodrigo Ran on: 12/04/2018 04:16 PM   Modules accepted: Orders

## 2018-12-05 ENCOUNTER — Telehealth: Payer: Self-pay | Admitting: Family Medicine

## 2018-12-05 LAB — BASIC METABOLIC PANEL
BUN: 41 mg/dL — ABNORMAL HIGH (ref 6–23)
CO2: 30 mEq/L (ref 19–32)
Calcium: 13.2 mg/dL (ref 8.4–10.5)
Chloride: 105 mEq/L (ref 96–112)
Creatinine, Ser: 2.78 mg/dL — ABNORMAL HIGH (ref 0.40–1.50)
GFR: 21.8 mL/min — ABNORMAL LOW (ref >60.00)
Glucose, Bld: 103 mg/dL — ABNORMAL HIGH (ref 70–99)
Potassium: 4.9 mEq/L (ref 3.5–5.1)
Sodium: 141 mEq/L (ref 135–145)

## 2018-12-05 NOTE — Addendum Note (Signed)
Addended by: Jon Billings on: 12/05/2018 01:45 PM   Modules accepted: Orders

## 2018-12-05 NOTE — Telephone Encounter (Signed)
Pt's granddaughter Rodman Comp called to report that [facility] in [place] states that they have no orders from PCP to assist pt. Please advise Best contact: [phone]

## 2018-12-05 NOTE — Telephone Encounter (Signed)
Patient is calling for advice. Patient states that Dr. Ethelene Hal wanted him to go to [place] for his kidneys. But is not sure. Please advise with the number and the name of the place where Dr. Ethelene Hal would like him to go.  Please advise  CB- [phone]

## 2018-12-05 NOTE — Telephone Encounter (Signed)
I spoke with pt. Gave him the name, address & phone number to [facility].

## 2018-12-08 ENCOUNTER — Inpatient Hospital Stay (HOSPITAL_COMMUNITY): Payer: Medicare HMO

## 2018-12-08 ENCOUNTER — Other Ambulatory Visit: Payer: Self-pay | Admitting: Family

## 2018-12-08 ENCOUNTER — Encounter (HOSPITAL_BASED_OUTPATIENT_CLINIC_OR_DEPARTMENT_OTHER): Payer: Self-pay

## 2018-12-08 ENCOUNTER — Inpatient Hospital Stay (HOSPITAL_BASED_OUTPATIENT_CLINIC_OR_DEPARTMENT_OTHER)
Admission: EM | Admit: 2018-12-08 | Discharge: 2018-12-13 | DRG: 641 | Disposition: A | Discharge status: Home Health | Payer: Medicare HMO | Source: Ambulatory Visit | Attending: Family Medicine | Admitting: Family Medicine

## 2018-12-08 ENCOUNTER — Other Ambulatory Visit: Payer: Self-pay | Admitting: *Deleted

## 2018-12-08 ENCOUNTER — Other Ambulatory Visit: Payer: Self-pay

## 2018-12-08 DIAGNOSIS — R918 Other nonspecific abnormal finding of lung field: Secondary | ICD-10-CM | POA: Diagnosis present

## 2018-12-08 DIAGNOSIS — Z20828 Contact with and (suspected) exposure to other viral communicable diseases: Secondary | ICD-10-CM | POA: Diagnosis present

## 2018-12-08 DIAGNOSIS — I10 Essential (primary) hypertension: Secondary | ICD-10-CM

## 2018-12-08 DIAGNOSIS — Z515 Encounter for palliative care: Secondary | ICD-10-CM | POA: Diagnosis not present

## 2018-12-08 DIAGNOSIS — K5900 Constipation, unspecified: Secondary | ICD-10-CM | POA: Diagnosis not present

## 2018-12-08 DIAGNOSIS — N400 Enlarged prostate without lower urinary tract symptoms: Secondary | ICD-10-CM | POA: Diagnosis present

## 2018-12-08 DIAGNOSIS — N132 Hydronephrosis with renal and ureteral calculous obstruction: Secondary | ICD-10-CM | POA: Diagnosis present

## 2018-12-08 DIAGNOSIS — R911 Solitary pulmonary nodule: Secondary | ICD-10-CM | POA: Diagnosis present

## 2018-12-08 DIAGNOSIS — D649 Anemia, unspecified: Secondary | ICD-10-CM | POA: Diagnosis present

## 2018-12-08 DIAGNOSIS — C762 Malignant neoplasm of abdomen: Secondary | ICD-10-CM | POA: Diagnosis not present

## 2018-12-08 DIAGNOSIS — Z87891 Personal history of nicotine dependence: Secondary | ICD-10-CM | POA: Diagnosis not present

## 2018-12-08 DIAGNOSIS — E785 Hyperlipidemia, unspecified: Secondary | ICD-10-CM | POA: Diagnosis present

## 2018-12-08 DIAGNOSIS — R079 Chest pain, unspecified: Secondary | ICD-10-CM | POA: Diagnosis not present

## 2018-12-08 DIAGNOSIS — Z7982 Long term (current) use of aspirin: Secondary | ICD-10-CM | POA: Diagnosis not present

## 2018-12-08 DIAGNOSIS — R5383 Other fatigue: Secondary | ICD-10-CM | POA: Diagnosis not present

## 2018-12-08 DIAGNOSIS — K219 Gastro-esophageal reflux disease without esophagitis: Secondary | ICD-10-CM | POA: Diagnosis not present

## 2018-12-08 DIAGNOSIS — E870 Hyperosmolality and hypernatremia: Secondary | ICD-10-CM | POA: Diagnosis not present

## 2018-12-08 DIAGNOSIS — R531 Weakness: Secondary | ICD-10-CM | POA: Diagnosis not present

## 2018-12-08 DIAGNOSIS — J90 Pleural effusion, not elsewhere classified: Secondary | ICD-10-CM | POA: Diagnosis not present

## 2018-12-08 DIAGNOSIS — N1330 Unspecified hydronephrosis: Secondary | ICD-10-CM | POA: Diagnosis not present

## 2018-12-08 DIAGNOSIS — N183 Chronic kidney disease, stage 3 (moderate): Secondary | ICD-10-CM | POA: Diagnosis present

## 2018-12-08 DIAGNOSIS — N179 Acute kidney failure, unspecified: Secondary | ICD-10-CM | POA: Diagnosis present

## 2018-12-08 DIAGNOSIS — I129 Hypertensive chronic kidney disease with stage 1 through stage 4 chronic kidney disease, or unspecified chronic kidney disease: Secondary | ICD-10-CM | POA: Diagnosis not present

## 2018-12-08 DIAGNOSIS — Z79899 Other long term (current) drug therapy: Secondary | ICD-10-CM | POA: Diagnosis not present

## 2018-12-08 DIAGNOSIS — Z7189 Other specified counseling: Secondary | ICD-10-CM | POA: Diagnosis not present

## 2018-12-08 LAB — COMPREHENSIVE METABOLIC PANEL
ALT: 32 U/L (ref 0–44)
AST: 32 U/L (ref 15–41)
Albumin: 3.9 g/dL (ref 3.5–5.0)
Alkaline Phosphatase: 79 U/L (ref 38–126)
Anion gap: 11 (ref 5–15)
BUN: 49 mg/dL — ABNORMAL HIGH (ref 8–23)
CO2: 26 mmol/L (ref 22–32)
Calcium: 13.5 mg/dL (ref 8.9–10.3)
Chloride: 104 mmol/L (ref 98–111)
Creatinine, Ser: 2.91 mg/dL — ABNORMAL HIGH (ref 0.61–1.24)
GFR calc Af Amer: 22 mL/min — ABNORMAL LOW (ref >60)
GFR calc non Af Amer: 19 mL/min — ABNORMAL LOW (ref >60)
Glucose, Bld: 110 mg/dL — ABNORMAL HIGH (ref 70–99)
Potassium: 4 mmol/L (ref 3.5–5.1)
Sodium: 141 mmol/L (ref 135–145)
Total Bilirubin: 0.8 mg/dL (ref 0.3–1.2)
Total Protein: 6.8 g/dL (ref 6.5–8.1)

## 2018-12-08 LAB — CBC WITH DIFFERENTIAL/PLATELET
Abs Immature Granulocytes: 0.02 10*3/uL (ref 0.00–0.07)
Basophils Absolute: 0 10*3/uL (ref 0.0–0.1)
Basophils Relative: 1 %
Eosinophils Absolute: 0.1 10*3/uL (ref 0.0–0.5)
Eosinophils Relative: 1 %
HCT: 35.7 % — ABNORMAL LOW (ref 39.0–52.0)
Hemoglobin: 11.3 g/dL — ABNORMAL LOW (ref 13.0–17.0)
Immature Granulocytes: 0 %
Lymphocytes Relative: 19 %
Lymphs Abs: 1.1 10*3/uL (ref 0.7–4.0)
MCH: 29.4 pg (ref 26.0–34.0)
MCHC: 31.7 g/dL (ref 30.0–36.0)
MCV: 93 fL (ref 80.0–100.0)
Monocytes Absolute: 0.7 10*3/uL (ref 0.1–1.0)
Monocytes Relative: 12 %
Neutro Abs: 3.7 10*3/uL (ref 1.7–7.7)
Neutrophils Relative %: 67 %
Platelets: 218 10*3/uL (ref 150–400)
RBC: 3.84 MIL/uL — ABNORMAL LOW (ref 4.22–5.81)
RDW: 13.8 % (ref 11.5–15.5)
WBC: 5.6 10*3/uL (ref 4.0–10.5)
nRBC: 0 % (ref 0.0–0.2)

## 2018-12-08 LAB — SARS CORONAVIRUS 2 BY RT PCR (HOSPITAL ORDER, PERFORMED IN ~~LOC~~ HOSPITAL LAB): SARS Coronavirus 2: NEGATIVE

## 2018-12-08 MED ORDER — SODIUM CHLORIDE 0.9 % IV SOLN
INTRAVENOUS | Status: DC
  Administered 2018-12-08 – 2018-12-10 (×6): via INTRAVENOUS

## 2018-12-08 MED ORDER — AMLODIPINE BESYLATE 5 MG PO TABS
5.0000 mg | ORAL_TABLET | Freq: Every day | ORAL | Status: DC
  Administered 2018-12-09 – 2018-12-13 (×5): 5 mg via ORAL
  Filled 2018-12-08 (×5): qty 1

## 2018-12-08 MED ORDER — POLYETHYLENE GLYCOL 3350 17 G PO PACK
17.0000 g | PACK | Freq: Every day | ORAL | Status: DC
  Filled 2018-12-08: qty 1

## 2018-12-08 MED ORDER — HEPARIN SODIUM (PORCINE) 5000 UNIT/ML IJ SOLN
5000.0000 [IU] | Freq: Three times a day (TID) | INTRAMUSCULAR | Status: DC
  Administered 2018-12-08 – 2018-12-13 (×15): 5000 [IU] via SUBCUTANEOUS
  Filled 2018-12-08 (×15): qty 1

## 2018-12-08 MED ORDER — SODIUM CHLORIDE 0.9 % IV BOLUS
1000.0000 mL | Freq: Once | INTRAVENOUS | Status: AC
  Administered 2018-12-08: 1000 mL via INTRAVENOUS

## 2018-12-08 MED ORDER — ONDANSETRON HCL 4 MG/2ML IJ SOLN
4.0000 mg | Freq: Four times a day (QID) | INTRAMUSCULAR | Status: DC | PRN
  Administered 2018-12-11 – 2018-12-12 (×3): 4 mg via INTRAVENOUS
  Filled 2018-12-08 (×3): qty 2

## 2018-12-08 MED ORDER — ACETAMINOPHEN 325 MG PO TABS
650.0000 mg | ORAL_TABLET | Freq: Four times a day (QID) | ORAL | Status: DC | PRN
  Administered 2018-12-09 – 2018-12-12 (×5): 650 mg via ORAL
  Filled 2018-12-08 (×5): qty 2

## 2018-12-08 MED ORDER — PANTOPRAZOLE SODIUM 40 MG PO TBEC
80.0000 mg | DELAYED_RELEASE_TABLET | Freq: Every day | ORAL | Status: DC
  Administered 2018-12-09 – 2018-12-13 (×5): 80 mg via ORAL
  Filled 2018-12-08 (×5): qty 2

## 2018-12-08 MED ORDER — METOPROLOL SUCCINATE ER 25 MG PO TB24
25.0000 mg | ORAL_TABLET | Freq: Every day | ORAL | Status: DC
  Administered 2018-12-09 – 2018-12-13 (×5): 25 mg via ORAL
  Filled 2018-12-08 (×5): qty 1

## 2018-12-08 MED ORDER — ONDANSETRON HCL 4 MG PO TABS
4.0000 mg | ORAL_TABLET | Freq: Four times a day (QID) | ORAL | Status: DC | PRN
  Administered 2018-12-11: 4 mg via ORAL
  Filled 2018-12-08: qty 1

## 2018-12-08 MED ORDER — ASPIRIN EC 81 MG PO TBEC
81.0000 mg | DELAYED_RELEASE_TABLET | Freq: Every day | ORAL | Status: DC
  Administered 2018-12-09 – 2018-12-13 (×5): 81 mg via ORAL
  Filled 2018-12-08 (×6): qty 1

## 2018-12-08 MED ORDER — PRAVASTATIN SODIUM 20 MG PO TABS
20.0000 mg | ORAL_TABLET | Freq: Every day | ORAL | Status: DC
  Administered 2018-12-09 – 2018-12-12 (×4): 20 mg via ORAL
  Filled 2018-12-08 (×4): qty 1

## 2018-12-08 MED ORDER — ACETAMINOPHEN 650 MG RE SUPP: 650.0000 mg | Freq: Four times a day (QID) | RECTAL | Status: DC | PRN

## 2018-12-08 NOTE — Telephone Encounter (Signed)
Pt's granddaughter Junie Panning) called to try to clear up some confusion pt has about a referral to [place] re: Kidney function.  Please call Erin to confirm if referral is needed/has been placed.    [phone]

## 2018-12-08 NOTE — ED Triage Notes (Signed)
Pt reports having recent left side abd pain and seeing PCP-pt had outpt labs drawn today-there was confusion r/t to lab stating they would draw blood but did not have orders in the system-son states that pt's niece has been in touch with PCP and was advised to have pt come to ED due to "renal failure"-pt NAD-steady gait-denies pain at this time

## 2018-12-08 NOTE — H&P (Signed)
History and Physical    Ricardo Sloan [id number] DOB: 03-30-1931 DOA: 12/08/2018  PCP: Libby Maw, MD  Patient coming from: Home  I have personally briefly reviewed patient's old medical records in [place]  Chief Complaint: Hypercalcemia  HPI: Ricardo Sloan is a 85 y.o. male with medical history significant of HTN, RUL pulmonary nodule that wasn't doing much on imaging for many years, CKD stage 3 at baseline.  Patient presents to the ED from PCP for worsening hypercalcemia.   Lab work drawn 4 days ago showed an elevated calcium and elevated kidney function.  Calcium up to 13.2 up from 12.4 a week before that.  Creatinine up to 2.8 up from 2.3, baseline 1.4 last year.  Patient has had some constipation, mild confusion, and generalized weakness.  No fevers no chills.   ED Course: Calcium 13.5, Creat 2.91, BUN 49.  Patient denies taking any Vit D supplements.   Review of Systems: As per HPI, otherwise all review of systems negative.  Past Medical History:  Diagnosis Date  . BPH (benign prostatic hypertrophy)   . Colon polyps   . GERD (gastroesophageal reflux disease)    Upper endoscopy 2003, Dr. Velora Heckler  . Hemorrhoids   . Hiatal hernia 2010  . Hyperlipidemia   . Hypertension   . Internal hemorrhoids   . Microscopic hematuria 2001   Dr.  Luanne Bras  . Pneumonia     X 2 in high school    Past Surgical History:  Procedure Laterality Date  . COLONOSCOPY  [id number]   Internal hemorrhoids, Dr. Velora Heckler  . NOSE SURGERY     Submucosal resection in the 1960s  . TONSILLECTOMY        . UPPER GASTROINTESTINAL ENDOSCOPY  2003   GERD  . WISDOM TOOTH EXTRACTION       reports that he quit smoking about 53 years ago. His smoking use included cigarettes and pipe. He has a 9.75 pack-year smoking history. He has never used smokeless tobacco. He reports current alcohol use. He reports that he does not use drugs.  No Known Allergies  Family  History  Problem Relation Age of Onset  . Diabetes Mother   . Subarachnoid hemorrhage Daughter   . Lung cancer Brother        smoker  . Other Paternal Grandfather        typhoid  . Heart disease Neg Hx   . Stroke Neg Hx   . Hypertension Neg Hx   . Colon cancer Neg Hx      Prior to Admission medications   Medication Sig Start Date End Date Taking? Authorizing Provider  amLODipine (NORVASC) 5 MG tablet Take 1 tablet (5 mg total) by mouth daily. 01/27/18  Yes Libby Maw, MD  Aspirin (ADULT ASPIRIN LOW STRENGTH) 81 MG EC tablet Take 81 mg by mouth daily.     Yes [provider]  loratadine (CLARITIN) 10 MG tablet Take 10 mg by mouth daily.     Yes [provider]  metoprolol succinate (TOPROL-XL) 25 MG 24 hr tablet Take 1 tablet (25 mg total) by mouth daily. 01/27/18  Yes Libby Maw, MD  Multiple Vitamin (MULTIVITAMIN) tablet Take 1 tablet by mouth daily.     Yes [provider]  omeprazole (PRILOSEC) 40 MG capsule Take 1 capsule (40 mg total) by mouth daily. 01/27/18  Yes Libby Maw, MD  polyethylene glycol powder [facility]) 17 GM/SCOOP powder Mix 1 scoop daily with  water until constipation is relieved. 12/04/18  Yes Libby Maw, MD  pravastatin (PRAVACHOL) 40 MG tablet TAKE ONE-HALF TABLET BY MOUTH EVERY NIGHT AT BEDTIME 01/27/18  Yes Libby Maw, MD  vitamin B-12 (CYANOCOBALAMIN) 1000 MCG tablet Take 1,000 mcg by mouth daily.   Yes [provider]    Physical Exam: Vitals:   12/08/18 2030 12/08/18 2100 12/08/18 2115 12/08/18 2311  BP: (!) 159/88 (!) 180/98  (!) 157/88  Pulse:   85 74  Resp:    12  Temp:    98.9 F (37.2 C)  TempSrc:    Oral  SpO2:   98% 100%  Weight:      Height:        Constitutional: NAD, calm, comfortable Eyes: PERRL, lids and conjunctivae normal ENMT: Mucous membranes are moist. Posterior pharynx clear of any exudate or lesions.Normal dentition.  Neck:  normal, supple, no masses, no thyromegaly Respiratory: clear to auscultation bilaterally, no wheezing, no crackles. Normal respiratory effort. No accessory muscle use.  Cardiovascular: Regular rate and rhythm, no murmurs / rubs / gallops. No extremity edema. 2+ pedal pulses. No carotid bruits.  Abdomen: no tenderness, no masses palpated. No hepatosplenomegaly. Bowel sounds positive.  Musculoskeletal: no clubbing / cyanosis. No joint deformity upper and lower extremities. Good ROM, no contractures. Normal muscle tone.  Skin: no rashes, lesions, ulcers. No induration Neurologic: CN 2-12 grossly intact. Sensation intact, DTR normal. Strength 5/5 in all 4.  Psychiatric: Normal judgment and insight. Alert and oriented x 3. Normal mood.    Labs on Admission: I have personally reviewed following labs and imaging studies  CBC: Recent Labs  Lab 12/08/18 1743  WBC 5.6  NEUTROABS 3.7  HGB 11.3*  HCT 35.7*  MCV 93.0  PLT 99991111   Basic Metabolic Panel: Recent Labs  Lab 12/04/18 1604 12/08/18 1743  NA 141 141  K 4.9 4.0  CL 105 104  CO2 30 26  GLUCOSE 103* 110*  BUN 41* 49*  CREATININE 2.78* 2.91*  CALCIUM 13.2* 13.5*   GFR: Estimated Creatinine Clearance: 18.5 mL/min (A) (by C-G formula based on SCr of 2.91 mg/dL (H)). Liver Function Tests: Recent Labs  Lab 12/08/18 1743  AST 32  ALT 32  ALKPHOS 79  BILITOT 0.8  PROT 6.8  ALBUMIN 3.9   No results for input(s): LIPASE, AMYLASE in the last 168 hours. No results for input(s): AMMONIA in the last 168 hours. Coagulation Profile: No results for input(s): INR, PROTIME in the last 168 hours. Cardiac Enzymes: No results for input(s): CKTOTAL, CKMB, CKMBINDEX, TROPONINI in the last 168 hours. BNP (last 3 results) No results for input(s): PROBNP in the last 8760 hours. HbA1C: No results for input(s): HGBA1C in the last 72 hours. CBG: No results for input(s): GLUCAP in the last 168 hours. Lipid Profile: No results for input(s):  CHOL, HDL, LDLCALC, TRIG, CHOLHDL, LDLDIRECT in the last 72 hours. Thyroid Function Tests: No results for input(s): TSH, T4TOTAL, FREET4, T3FREE, THYROIDAB in the last 72 hours. Anemia Panel: No results for input(s): VITAMINB12, FOLATE, FERRITIN, TIBC, IRON, RETICCTPCT in the last 72 hours. Urine analysis:    Component Value Date/Time   COLORURINE YELLOW 11/24/2018 1507   APPEARANCEUR CLEAR 11/24/2018 1507   LABSPEC 1.025 11/24/2018 1507   PHURINE 5.0 11/24/2018 1507   GLUCOSEU NEGATIVE 11/24/2018 1507   HGBUR NEGATIVE 11/24/2018 1507   HGBUR negative 10/03/2009 0000   BILIRUBINUR NEGATIVE 11/24/2018 1507   KETONESUR NEGATIVE 11/24/2018 1507   UROBILINOGEN 0.2  11/24/2018 1507   NITRITE NEGATIVE 11/24/2018 [place] 11/24/2018 1507    Radiological Exams on Admission: Dg Chest Port 1 View  Result Date: 12/08/2018 CLINICAL DATA:  Hypercalcemia workup, weakness EXAM: PORTABLE CHEST 1 VIEW COMPARISON:  04/25/2017, CT chest 04/10/2017 FINDINGS: No focal opacity or pleural effusion. Normal cardiomediastinal silhouette with aortic atherosclerosis. No pneumothorax. Possible small lung nodule in the right upper lobe. IMPRESSION: No active disease. Possible small lung nodule in the right upper lobe which may be evaluated with follow-up CT. Electronically Signed   By: Donavan Foil M.D.   On: 12/08/2018 23:08    EKG: Independently reviewed.  Assessment/Plan Principal Problem:   Hypercalcemia Active Problems:   Essential hypertension   Lung nodules   AKI (acute kidney injury) ([place])    1. Hypercalcemia - 1. Hyperparathyroidism vs occult malignancy are the most likely differential diagnoses 2. PTH and PTHrp ordered and pending 3. Calcitriol pending 4. Protein electrophoresis ordered 5. IVF: NS at 125 cc/hr 6. If no improvement despite IVF then may need calcitonin as next step. 7. Recheck BMP in AM 8. CXR not really remarkable: just shows the RUL "possible small lung  nodule" which probably correlates to the well known RUL nodule that hes had on numerous imaging studies over the past decade (see also CT scan from Jan 2019, etc). 2. Lung nodule - see above 3. HTN - continue home BP meds 4. AKI on CKD stage 3 -  1. IVF 2. Repeat BMP in AM 3. Wont do bisphosphonate as a result.  DVT prophylaxis: Heparin [place] Code Status: Full Family Communication: No family in room Disposition Plan: Home after admit Consults called: None Admission status: Admit to inpatient  Severity of Illness: The appropriate patient status for this patient is INPATIENT. Inpatient status is judged to be reasonable and necessary in order to provide the required intensity of service to ensure the patient's safety. The patient's presenting symptoms, physical exam findings, and initial radiographic and laboratory data in the context of their chronic comorbidities is felt to place them at high risk for further clinical deterioration. Furthermore, it is not anticipated that the patient will be medically stable for discharge from the hospital within 2 midnights of admission. The following factors support the patient status of inpatient.   IP status for symptomatic hypercalcemia with AKI, failed outpatient treatment (worsening despite outpt workup).   * I certify that at the point of admission it is my clinical judgment that the patient will require inpatient hospital care spanning beyond 2 midnights from the point of admission due to high intensity of service, high risk for further deterioration and high frequency of surveillance required.*    Amadi Yoshino M. DO [facility]  How to contact the [facility] Attending or Consulting provider [place] or covering provider during after hours [place], for this patient?  1. Check the care team in [facility] and look for a) attending/consulting [facility] provider listed and b) the [facility] team listed 2. Log into [email or web address]  Amion Physician Scheduling and messaging  for groups and whole hospitals  On call and physician scheduling software for group practices, residents, hospitalists and other medical providers for call, clinic, rotation and shift schedules. On[employer] is a hospital-wide system for scheduling doctors and paging doctors on call. EasyPlot is for scientific plotting and data analysis.  [email or web address]  and use [place]'s universal password to access. If you do not have the password, please contact the hospital operator.  3.  Locate the [facility] provider you are looking for under [facility] and page to a number that you can be directly reached. 4. If you still have difficulty reaching the provider, please page the [facility] (Director on Call) for the Hospitalists listed on amion for assistance.  12/08/2018, 11:13 PM

## 2018-12-08 NOTE — Addendum Note (Signed)
Addended by: Lynnea Ferrier on: 12/08/2018 02:50 PM   Modules accepted: Orders

## 2018-12-08 NOTE — ED Provider Notes (Signed)
[facility] EMERGENCY DEPARTMENT Provider Note   CSN: [id number] Arrival date & time: 12/08/18  1604     History   Chief Complaint Chief Complaint  Patient presents with  . Abnormal Lab    HPI Ricardo Sloan is a 85 y.o. male.     HPI Patient sent in from PCP for worsening labs.  Lab work drawn 4 days ago showed an elevated calcium and elevated kidney function.  Calcium up to 13.2 up from 12.4 a week before that.  Creatinine up to 2.8 up from 2.3.  10 months ago had normal calcium and normal kidney function.  Had had some constipation.  Said some mild confusion generalized weakness.  No fevers.  No chills.  Protein electrophoresis.  Side right hormone had been drawn as an outpatient however the had not drawn other labs and was told to come to the ER for further evaluation. Past Medical History:  Diagnosis Date  . BPH (benign prostatic hypertrophy)   . Colon polyps   . GERD (gastroesophageal reflux disease)    Upper endoscopy 2003, Dr. Velora Heckler  . Hemorrhoids   . Hiatal hernia 2010  . Hyperlipidemia   . Hypertension   . Internal hemorrhoids   . Microscopic hematuria 2001   Dr.  Luanne Bras  . Pneumonia     X 2 in high school    Patient Active Problem List   Diagnosis Date Noted  . Hypercalcemia 12/05/2018  . Constipation 12/04/2018  . AKI (acute kidney injury) ([place]) 12/04/2018  . Decreased GFR 11/27/2018  . Hypotension 11/27/2018  . Fatigue 11/27/2018  . ASCVD (arteriosclerotic cardiovascular disease) 08/06/2017  . Screening for prostate cancer 10/24/2015  . Lung nodules 10/24/2015  . Prediabetes 10/21/2014  . Anemia 10/21/2014  . Abnormal stress test 08/26/2014  . Heart palpitations 08/26/2014  . Abnormal chest x-ray 10/15/2012  . Bronchiectasis without complication ([place]) 0000000  . GERD 10/15/2008  . DYSPHAGIA 10/15/2008  . ERECTILE DYSFUNCTION 07/09/2007  . Mixed hyperlipidemia 07/15/2006  . Essential hypertension 07/15/2006    Past  Surgical History:  Procedure Laterality Date  . COLONOSCOPY  [id number]   Internal hemorrhoids, Dr. Velora Heckler  . NOSE SURGERY     Submucosal resection in the 1960s  . TONSILLECTOMY        . UPPER GASTROINTESTINAL ENDOSCOPY  2003   GERD  . WISDOM TOOTH EXTRACTION          Home Medications    Prior to Admission medications   Medication Sig Start Date End Date Taking? Authorizing Provider  amLODipine (NORVASC) 5 MG tablet Take 1 tablet (5 mg total) by mouth daily. 01/27/18   Libby Maw, MD  Aspirin (ADULT ASPIRIN LOW STRENGTH) 81 MG EC tablet Take 81 mg by mouth daily.      [provider]  loratadine (CLARITIN) 10 MG tablet Take 10 mg by mouth daily.      [provider]  metoprolol succinate (TOPROL-XL) 25 MG 24 hr tablet Take 1 tablet (25 mg total) by mouth daily. 01/27/18   Libby Maw, MD  Multiple Vitamin (MULTIVITAMIN) tablet Take 1 tablet by mouth daily.      [provider]  omeprazole (PRILOSEC) 40 MG capsule Take 1 capsule (40 mg total) by mouth daily. 01/27/18   Libby Maw, MD  polyethylene glycol powder [facility]) 17 GM/SCOOP powder Mix 1 scoop daily with water until constipation is relieved. 12/04/18   Libby Maw, MD  pravastatin (PRAVACHOL) 40 MG tablet  TAKE ONE-HALF TABLET BY MOUTH EVERY NIGHT AT BEDTIME 01/27/18   Libby Maw, MD    Family History Family History  Problem Relation Age of Onset  . Diabetes Mother   . Subarachnoid hemorrhage Daughter   . Lung cancer Brother        smoker  . Other Paternal Grandfather        typhoid  . Heart disease Neg Hx   . Stroke Neg Hx   . Hypertension Neg Hx   . Colon cancer Neg Hx     Social History Social History   Tobacco Use  . Smoking status: Former Smoker    Packs/day: 0.75    Years: 13.00    Pack years: 9.75    Types: Cigarettes, Pipe    Quit date: 03/26/1965    Years since quitting: 53.7  . Smokeless tobacco: Never Used   . Tobacco comment: smoked 1953-1968, up to 1 ppd  Substance Use Topics  . Alcohol use: Yes    Comment:  rarely socially; 1-2 / month  . Drug use: No     Allergies   Patient has no known allergies.   Review of Systems Review of Systems  Constitutional: Positive for fatigue. Negative for appetite change and fever.  HENT: Negative for congestion.   Cardiovascular: Negative for chest pain.  Gastrointestinal: Positive for constipation.  Genitourinary: Negative for flank pain.  Musculoskeletal: Positive for back pain.       Mild left mid backslash flank pain.  Skin: Negative for rash.  Neurological: Positive for weakness.  Psychiatric/Behavioral: Positive for confusion.     Physical Exam Updated Vital Signs BP (!) 173/86   Pulse 78   Temp 98.1 F (36.7 C) (Oral)   Resp 16   Ht 5\' 9"  (1.753 m)   Wt 70.3 kg   SpO2 98%   BMI 22.89 kg/m   Physical Exam Vitals signs and nursing note reviewed.  HENT:     Head: Atraumatic.  Cardiovascular:     Rate and Rhythm: Normal rate and regular rhythm.  Pulmonary:     Breath sounds: No wheezing or rhonchi.  Abdominal:     Tenderness: There is no abdominal tenderness.  Musculoskeletal:        General: No tenderness.  Skin:    General: Skin is warm.     Capillary Refill: Capillary refill takes less than 2 seconds.  Neurological:     Mental Status: He is alert.     Comments: Patient is awake and able answer questions.      ED Treatments / Results  Labs (all labs ordered are listed, but only abnormal results are displayed) Labs Reviewed  COMPREHENSIVE METABOLIC PANEL - Abnormal; Notable for the following components:      Result Value   Glucose, Bld 110 (*)    BUN 49 (*)    Creatinine, Ser 2.91 (*)    Calcium 13.5 (*)    GFR calc non Af Amer 19 (*)    GFR calc Af Amer 22 (*)    All other components within normal limits  CBC WITH DIFFERENTIAL/PLATELET - Abnormal; Notable for the following components:   RBC 3.84 (*)     Hemoglobin 11.3 (*)    HCT 35.7 (*)    All other components within normal limits    EKG EKG Interpretation  Date/Time:  Monday December 08 2018 17:30:09 EDT Ventricular Rate:  75 PR Interval:    QRS Duration: 91 QT Interval:  377 QTC Calculation:  421 R Axis:   23 Text Interpretation:  Sinus rhythm Atrial premature complex Low voltage, extremity leads Anteroseptal infarct, old Confirmed by Davonna Belling [phone]) on 12/08/2018 5:58:59 PM   Radiology No results found.  Procedures Procedures (including critical care time)  Medications Ordered in ED Medications  sodium chloride 0.9 % bolus 1,000 mL (0 mLs Intravenous Stopped 12/08/18 1914)     Initial Impression / Assessment and Plan / ED Course  I have reviewed the triage vital signs and the nursing notes.  Pertinent labs & imaging results that were available during my care of the patient were reviewed by me and considered in my medical decision making (see chart for details).        Patient with hypercalcemia and worsening renal function.  Has been worked up somewhat by PCP and still has worsening labs.  Worse since 4 days ago.  Symptomatic of the hypercalcemia with some confusion and generalized weakness.  Will admit to hospitalist.  Final Clinical Impressions(s) / ED Diagnoses   Final diagnoses:  Hypercalcemia  AKI (acute kidney injury) [facility])    ED Discharge Orders    None       Davonna Belling, MD 12/08/18 1947

## 2018-12-08 NOTE — Telephone Encounter (Signed)
Discussed patient's issues with his grand daughter, Junie Panning, who will see that he goes to the ER. She says that he is confused and lethargic.

## 2018-12-08 NOTE — Telephone Encounter (Signed)
Can you give pt's granddaughter a call back? She had some questions for you.

## 2018-12-08 NOTE — Progress Notes (Signed)
Transfer for [facility]  85 yo male with HTN presenting with abnormal labs from PCP. Has been getting workup for hypercalcemia outpatient but now having weakness and constipation. Calcium of 13.5 today (12.4 about 2 weeks ago). Cr 2.9 (2.78 last week and is trending up from previous weeks). Vitals stable.

## 2018-12-08 NOTE — ED Notes (Signed)
Date and time results received: 12/08/18 1848 (use smartphrase ".now" to insert current time)  Test: calcium Critical Value: 13.5  Name of Provider Notified: Dr. Alvino Chapel  Orders Received? Or Actions Taken?: no new orders

## 2018-12-08 NOTE — Telephone Encounter (Signed)
Needs to come in for ordered blood work so that I can make the appropriate referral.

## 2018-12-08 NOTE — ED Notes (Signed)
Was sent here for possible renal failure by pcp

## 2018-12-09 ENCOUNTER — Encounter (HOSPITAL_COMMUNITY): Payer: Self-pay

## 2018-12-09 LAB — CBC
HCT: 31 % — ABNORMAL LOW (ref 39.0–52.0)
Hemoglobin: 10 g/dL — ABNORMAL LOW (ref 13.0–17.0)
MCH: 30 pg (ref 26.0–34.0)
MCHC: 32.3 g/dL (ref 30.0–36.0)
MCV: 93.1 fL (ref 80.0–100.0)
Platelets: 189 10*3/uL (ref 150–400)
RBC: 3.33 MIL/uL — ABNORMAL LOW (ref 4.22–5.81)
RDW: 13.8 % (ref 11.5–15.5)
WBC: 5.6 10*3/uL (ref 4.0–10.5)
nRBC: 0 % (ref 0.0–0.2)

## 2018-12-09 LAB — BASIC METABOLIC PANEL
Anion gap: 8 (ref 5–15)
BUN: 46 mg/dL — ABNORMAL HIGH (ref 8–23)
CO2: 25 mmol/L (ref 22–32)
Calcium: 12.4 mg/dL — ABNORMAL HIGH (ref 8.9–10.3)
Chloride: 109 mmol/L (ref 98–111)
Creatinine, Ser: 2.76 mg/dL — ABNORMAL HIGH (ref 0.61–1.24)
GFR calc Af Amer: 23 mL/min — ABNORMAL LOW (ref >60)
GFR calc non Af Amer: 20 mL/min — ABNORMAL LOW (ref >60)
Glucose, Bld: 138 mg/dL — ABNORMAL HIGH (ref 70–99)
Potassium: 3.4 mmol/L — ABNORMAL LOW (ref 3.5–5.1)
Sodium: 142 mmol/L (ref 135–145)

## 2018-12-09 MED ORDER — CALCITONIN (SALMON) 200 UNIT/ACT NA SOLN
1.0000 | Freq: Every day | NASAL | Status: AC
  Administered 2018-12-09 – 2018-12-10 (×2): 1 via NASAL
  Filled 2018-12-09: qty 3.7

## 2018-12-09 MED ORDER — POLYETHYLENE GLYCOL 3350 17 G PO PACK: 17.0000 g | PACK | Freq: Every day | ORAL | Status: DC | PRN

## 2018-12-09 MED ORDER — POTASSIUM CHLORIDE CRYS ER 20 MEQ PO TBCR
40.0000 meq | EXTENDED_RELEASE_TABLET | Freq: Once | ORAL | Status: AC
  Administered 2018-12-09: 10:00:00 40 meq via ORAL
  Filled 2018-12-09: qty 2

## 2018-12-09 MED ORDER — DOCUSATE SODIUM 100 MG PO CAPS
100.0000 mg | ORAL_CAPSULE | Freq: Two times a day (BID) | ORAL | Status: DC
  Administered 2018-12-09 – 2018-12-13 (×7): 100 mg via ORAL
  Filled 2018-12-09 (×8): qty 1

## 2018-12-09 NOTE — Progress Notes (Signed)
24 hour urine started at 03:00 a.m.

## 2018-12-09 NOTE — Progress Notes (Addendum)
[facility]                                                                              Patient Demographics  Ricardo Sloan, is a 85 y.o. male, DOB - 16-Nov-1931, HWK:[id number]  Admit date - 12/08/2018   Admitting Physician Orene Desanctis, DO  Outpatient Primary MD for the patient is Libby Maw, MD  Outpatient specialists:   LOS - 1  days   Medical records reviewed and are as summarized below:    Chief Complaint  Patient presents with  . Abnormal Lab       Brief summary   Patient is 85 year old male with history of hypertension, right upper lung pulmonary nodule, CKD stage III, presented to the ED from PCP for worsening hypercalcemia. Seen by his PCP on 9/10 for follow-up.  Baseline labs were ordered, sent to ED for abnormal labs, acute renal failure, hypercalcemia.   In ED, patient reported recent left-sided abdominal pain.  Patient was also having some mild confusion, generalized weakness, constipation. Calcium 13.5, was 12.4 about 2 weeks ago.  Creatinine 2.9 (was 2.7 last week)  Assessment & Plan    Principal Problem:   Hypercalcemia -Unclear etiology, patient denies taking any vitamin D supplements, excessive Tums or other calcium supplements. -Chest x-ray showed no active disease, possible small lung nodule in the right upper lobe, has been complaining of left sided abdominal and back pain -Given anemia, acute on chronic worsening renal failure, hypercalcemia, rule out multiple myeloma or any other underlying malignancy PTH, PTHrp, Calcitrol, SPEP UPEP pending -If SPEP, UPEP negative for multiple myeloma, obtain CT chest abdomen pelvis to rule out malignancy.  Denies any recent significant loss of weight or appetite. -Continue aggressive IV fluid hydration, calcium level is improving 12.4 ( <- 13.5) - will give 2 doses of Miacalcin spray, recheck calcium in a.m. -Unable to give bisphosphonates due to AKI on CKD  Active Problems:  Essential hypertension -BP stable, continue Norvasc    GERD  -Continue PPI  Normocytic anemia -Baseline 12-13 -Hemoglobin slightly lower today 10.0 from 11.3 likely due to hemodilution    AKI (acute kidney injury) ([place]) on CKD stage III -Continue aggressive IV fluid hydration, presented with creatinine of 2.9, improving with IV fluids  Constipation -Placed on stool softeners, patient reports BM this morning  Lung nodule, RUL Chest x-ray showed possible small lung nodule in the right upper lobe.  On CT chest 03/2017 had micro nodularity in the right upper, middle lobes and lingula suspected due to bronchiectasis. Will need CT chest abdomen pelvis to rule out malignancy if multiple myeloma work-up negative.  Code Status: Full code DVT Prophylaxis: Heparin subcu Family Communication: Discussed all imaging results, lab results, explained to the patient and discussed in detail with the patient's son  Disposition Plan: Remains inpatient for further work-up, creatinine and calcium still elevated, will need to rule out malignancy, multiple myeloma  Time Spent in minutes 40 minutes  Procedures:  None  Consultants:   None  Antimicrobials:   Anti-infectives (From admission, onward)   None         Medications  Scheduled Meds: .  amLODipine  5 mg Oral Daily  . aspirin EC  81 mg Oral Daily  . heparin  5,000 Units Subcutaneous Q8H  . metoprolol succinate  25 mg Oral Daily  . pantoprazole  80 mg Oral Daily  . polyethylene glycol  17 g Oral Daily  . pravastatin  20 mg Oral q1800   Continuous Infusions: . sodium chloride 125 mL/hr at 12/09/18 0652   PRN Meds:.acetaminophen **OR** acetaminophen, ondansetron **OR** ondansetron (ZOFRAN) IV      Subjective:   Conway Fedora was seen and examined today.  Denies any nausea or vomiting or diarrhea.  Did have normal BM this morning.  No fevers or chills.  States has been having left-sided abdominal and upper back pain for last  few weeks.  Patient denies dizziness, chest pain, shortness of breath . No acute events overnight.    Objective:   Vitals:   12/08/18 2100 12/08/18 2115 12/08/18 2311 12/09/18 0547  BP: (!) 180/98  (!) 157/88 117/73  Pulse:  85 74 75  Resp:   12 18  Temp:   98.9 F (37.2 C) 98.2 F (36.8 C)  TempSrc:   Oral   SpO2:  98% 100% 96%  Weight:   68 kg   Height:   _0  (1.651 m)     Intake/Output Summary (Last 24 hours) at 12/09/2018 1214 Last data filed at 12/09/2018 8502 Gross per 24 hour  Intake 1911.13 ml  Output 500 ml  Net 1411.13 ml     Wt Readings from Last 3 Encounters:  12/08/18 68 kg  11/27/18 70.3 kg  07/28/18 72.1 kg     Exam  General: Alert and oriented x 3, NAD, hearing deficit  Eyes  HEENT:  Atraumatic, normocephalic,  Cardiovascular: S1 S2 auscultated, no murmurs, RRR  Respiratory: Clear to auscultation bilaterally, no wheezing, rales or rhonchi  Gastrointestinal: Soft, nontender, nondistended, + bowel sounds  Ext: no pedal edema bilaterally  Neuro: No new deficits  Musculoskeletal: No digital cyanosis, clubbing  Skin: No rashes  Psych: Normal affect and demeanor, alert and oriented x3    Data Reviewed:  I have personally reviewed following labs and imaging studies  Micro Results Recent Results (from the past 240 hour(s))  SARS Coronavirus 2 [facility] order, Performed in [place] hospital lab) Nasopharyngeal Nasopharyngeal Swab     Status: None   Collection Time: 12/08/18  8:15 PM   Specimen: Nasopharyngeal Swab  Result Value Ref Range Status   SARS Coronavirus 2 NEGATIVE NEGATIVE Final    Comment: (NOTE) If result is NEGATIVE SARS-CoV-2 target nucleic acids are NOT DETECTED. The SARS-CoV-2 RNA is generally detectable in upper and lower  respiratory specimens during the acute phase of infection. The lowest  concentration of SARS-CoV-2 viral copies this assay can detect is 250  copies / mL. A negative result does not preclude  SARS-CoV-2 infection  and should not be used as the sole basis for treatment or other  patient management decisions.  A negative result may occur with  improper specimen collection / handling, submission of specimen other  than nasopharyngeal swab, presence of viral mutation(s) within the  areas targeted by this assay, and inadequate number of viral copies  (<250 copies / mL). A negative result must be combined with clinical  observations, patient history, and epidemiological information. If result is POSITIVE SARS-CoV-2 target nucleic acids are DETECTED. The SARS-CoV-2 RNA is generally detectable in upper and lower  respiratory specimens dur ing the acute phase of infection.  Positive  results are indicative of active infection with SARS-CoV-2.  Clinical  correlation with patient history and other diagnostic information is  necessary to determine patient infection status.  Positive results do  not rule out bacterial infection or co-infection with other viruses. If result is PRESUMPTIVE POSTIVE SARS-CoV-2 nucleic acids MAY BE PRESENT.   A presumptive positive result was obtained on the submitted specimen  and confirmed on repeat testing.  While 2019 novel coronavirus  (SARS-CoV-2) nucleic acids may be present in the submitted sample  additional confirmatory testing may be necessary for epidemiological  and / or clinical management purposes  to differentiate between  SARS-CoV-2 and other Sarbecovirus currently known to infect humans.  If clinically indicated additional testing with an alternate test  methodology [phone]) is advised. The SARS-CoV-2 RNA is generally  detectable in upper and lower respiratory sp ecimens during the acute  phase of infection. The expected result is Negative. Fact Sheet for Patients:  StrictlyIdeas.no Fact Sheet for Healthcare Providers: BankingDealers.co.za This test is not yet approved or cleared by the  [place] FDA and has been authorized for detection and/or diagnosis of SARS-CoV-2 by FDA under an Emergency Use Authorization (EUA).  This EUA will remain in effect (meaning this test can be used) for the duration of the COVID-19 declaration under Section 564(b)(1) of the Act, 21 U.S.C. section 360bbb-3(b)(1), unless the authorization is terminated or revoked sooner. Performed at [facility], [address], [place], [place] [address]     Radiology Reports Dg Chest [place] 1 View  Result Date: 12/08/2018 CLINICAL DATA:  Hypercalcemia workup, weakness EXAM: PORTABLE CHEST 1 VIEW COMPARISON:  04/25/2017, CT chest 04/10/2017 FINDINGS: No focal opacity or pleural effusion. Normal cardiomediastinal silhouette with aortic atherosclerosis. No pneumothorax. Possible small lung nodule in the right upper lobe. IMPRESSION: No active disease. Possible small lung nodule in the right upper lobe which may be evaluated with follow-up CT. Electronically Signed   By: Donavan Foil M.D.   On: 12/08/2018 23:08    Lab Data:  CBC: Recent Labs  Lab 12/08/18 1743 12/09/18 0258  WBC 5.6 5.6  NEUTROABS 3.7  --   HGB 11.3* 10.0*  HCT 35.7* 31.0*  MCV 93.0 93.1  PLT 218 709   Basic Metabolic Panel: Recent Labs  Lab 12/04/18 1604 12/08/18 1743 12/09/18 0258  NA 141 141 142  K 4.9 4.0 3.4*  CL 105 104 109  CO2 _0 GLUCOSE 103* 110* 138*  BUN 41* 49* 46*  CREATININE 2.78* 2.91* 2.76*  CALCIUM 13.2* 13.5* 12.4*   GFR: Estimated Creatinine Clearance: 17 mL/min (A) (by C-G formula based on SCr of 2.76 mg/dL (H)). Liver Function Tests: Recent Labs  Lab 12/08/18 1353 12/08/18 1743  AST  --  32  ALT  --  32  ALKPHOS  --  79  BILITOT  --  0.8  PROT 6.1 6.8  ALBUMIN  --  3.9   No results for input(s): LIPASE, AMYLASE in the last 168 hours. No results for input(s): AMMONIA in the last 168 hours. Coagulation Profile: No results for input(s): INR, PROTIME in the last 168  hours. Cardiac Enzymes: No results for input(s): CKTOTAL, CKMB, CKMBINDEX, TROPONINI in the last 168 hours. BNP (last 3 results) No results for input(s): PROBNP in the last 8760 hours. HbA1C: No results for input(s): HGBA1C in the last 72 hours. CBG: No results for input(s): GLUCAP in the last 168 hours. Lipid Profile: No results for input(s):  CHOL, HDL, LDLCALC, TRIG, CHOLHDL, LDLDIRECT in the last 72 hours. Thyroid Function Tests: No results for input(s): TSH, T4TOTAL, FREET4, T3FREE, THYROIDAB in the last 72 hours. Anemia Panel: No results for input(s): VITAMINB12, FOLATE, FERRITIN, TIBC, IRON, RETICCTPCT in the last 72 hours. Urine analysis:    Component Value Date/Time   COLORURINE YELLOW 11/24/2018 1507   APPEARANCEUR CLEAR 11/24/2018 1507   LABSPEC 1.025 11/24/2018 1507   PHURINE 5.0 11/24/2018 1507   GLUCOSEU NEGATIVE 11/24/2018 1507   HGBUR NEGATIVE 11/24/2018 1507   HGBUR negative 10/03/2009 0000   BILIRUBINUR NEGATIVE 11/24/2018 1507   KETONESUR NEGATIVE 11/24/2018 1507   UROBILINOGEN 0.2 11/24/2018 1507   NITRITE NEGATIVE 11/24/2018 1507   LEUKOCYTESUR NEGATIVE 11/24/2018 1507       M.D. [facility] 12/09/2018, 12:14 PM  Pager: [phone] Between 7am to 7pm - call Pager - [phone]  After 7pm go to [email or web address] - password [email or web address]  Call night coverage person covering after 7pm

## 2018-12-10 ENCOUNTER — Inpatient Hospital Stay (HOSPITAL_COMMUNITY): Payer: Medicare HMO

## 2018-12-10 ENCOUNTER — Encounter (HOSPITAL_COMMUNITY): Payer: Self-pay | Admitting: Nephrology

## 2018-12-10 LAB — PROTEIN ELECTROPHORESIS, URINE REFLEX
Albumin ELP, Urine: 29.3 %
Alpha-1-Globulin, U: 2.7 %
Alpha-2-Globulin, U: 22.3 %
Beta Globulin, U: 26.6 %
Gamma Globulin, U: 19.1 %
Protein, Ur: 17.8 mg/dL

## 2018-12-10 LAB — PROTEIN ELECTROPHORESIS, SERUM
A/G Ratio: 1.7 (ref 0.7–1.7)
A/G Ratio: 1.6 (ref 0.7–1.7)
Albumin ELP: 3.8 g/dL (ref 2.9–4.4)
Albumin ELP: 3 g/dL (ref 2.9–4.4)
Alpha 1: 0.3 g/dL (ref 0.0–0.4)
Alpha 2: 0.9 g/dL (ref 0.4–1.0)
Alpha-1-Globulin: 0.2 g/dL (ref 0.0–0.4)
Alpha-2-Globulin: 0.7 g/dL (ref 0.4–1.0)
Beta Globulin: 0.6 g/dL — ABNORMAL LOW (ref 0.7–1.3)
Beta: 0.8 g/dL (ref 0.7–1.3)
Gamma Globulin: 0.4 g/dL (ref 0.4–1.8)
Gamma Globulin: 0.3 g/dL — ABNORMAL LOW (ref 0.4–1.8)
Globulin, Total: 2.3 g/dL (ref 2.2–3.9)
Globulin, Total: 1.9 g/dL — ABNORMAL LOW (ref 2.2–3.9)
M-Spike, %: 0.1 g/dL — ABNORMAL HIGH
Total Protein ELP: 4.9 g/dL — ABNORMAL LOW (ref 6.0–8.5)
Total Protein: 6.1 g/dL (ref 6.0–8.5)

## 2018-12-10 LAB — CBC
HCT: 31.1 % — ABNORMAL LOW (ref 39.0–52.0)
Hemoglobin: 10 g/dL — ABNORMAL LOW (ref 13.0–17.0)
MCH: 30.4 pg (ref 26.0–34.0)
MCHC: 32.2 g/dL (ref 30.0–36.0)
MCV: 94.5 fL (ref 80.0–100.0)
Platelets: 171 10*3/uL (ref 150–400)
RBC: 3.29 MIL/uL — ABNORMAL LOW (ref 4.22–5.81)
RDW: 14.1 % (ref 11.5–15.5)
WBC: 5.5 10*3/uL (ref 4.0–10.5)
nRBC: 0 % (ref 0.0–0.2)

## 2018-12-10 LAB — BASIC METABOLIC PANEL
Anion gap: 7 (ref 5–15)
BUN: 43 mg/dL — ABNORMAL HIGH (ref 8–23)
CO2: 22 mmol/L (ref 22–32)
Calcium: 11.6 mg/dL — ABNORMAL HIGH (ref 8.9–10.3)
Chloride: 112 mmol/L — ABNORMAL HIGH (ref 98–111)
Creatinine, Ser: 2.91 mg/dL — ABNORMAL HIGH (ref 0.61–1.24)
GFR calc Af Amer: 22 mL/min — ABNORMAL LOW (ref >60)
GFR calc non Af Amer: 19 mL/min — ABNORMAL LOW (ref >60)
Glucose, Bld: 107 mg/dL — ABNORMAL HIGH (ref 70–99)
Potassium: 3.8 mmol/L (ref 3.5–5.1)
Sodium: 141 mmol/L (ref 135–145)

## 2018-12-10 LAB — URINALYSIS, ROUTINE W REFLEX MICROSCOPIC
Bilirubin Urine: NEGATIVE
Glucose, UA: NEGATIVE mg/dL
Hgb urine dipstick: NEGATIVE
Ketones, ur: NEGATIVE mg/dL
Leukocytes,Ua: NEGATIVE
Nitrite: NEGATIVE
Protein, ur: NEGATIVE mg/dL
Specific Gravity, Urine: 1.009 (ref 1.005–1.030)
pH: 6 (ref 5.0–8.0)

## 2018-12-10 LAB — PARATHYROID HORMONE, INTACT (NO CA): PTH: 10 pg/mL — ABNORMAL LOW (ref 15–65)

## 2018-12-10 LAB — CREATININE, URINE, RANDOM: Creatinine, Urine: 58.55 mg/dL

## 2018-12-10 LAB — CALCITRIOL (1,25 DI-OH VIT D): Vit D, 1,25-Dihydroxy: 162 pg/mL — ABNORMAL HIGH (ref 19.9–79.3)

## 2018-12-10 LAB — SODIUM, URINE, RANDOM: Sodium, Ur: 93 mmol/L

## 2018-12-10 MED ORDER — CALCITONIN (SALMON) 200 UNIT/ML IJ SOLN
4.0000 [IU]/kg | Freq: Two times a day (BID) | INTRAMUSCULAR | Status: AC
  Administered 2018-12-10 – 2018-12-11 (×2): 272 [IU] via SUBCUTANEOUS
  Filled 2018-12-10 (×2): qty 1.36

## 2018-12-10 MED ORDER — SODIUM CHLORIDE 0.9 % IV SOLN
60.0000 mg | Freq: Once | INTRAVENOUS | Status: AC
  Administered 2018-12-10: 60 mg via INTRAVENOUS
  Filled 2018-12-10: qty 20

## 2018-12-10 NOTE — Consult Note (Signed)
Renal Service Consult Note [facility]  Ricardo Sloan 12/10/2018 Ricardo Sloan Requesting Physician:  Dr Lonny Prude, R.   Reason for Consult:  Renal failure HPI: The patient is a 85 y.o. year-old with hx of HTN, HL, PNA, GERD and BPH admitted on 9/14 sent from PCP for new hypercalcemia. Also creat was up to 2.8 from 1.4 last year in 2019.  PTH is low, myeloma w/u in progress.  Getting calcitonin and IVF's. Creat 2.9 today. Asked to see for renal failure/ ^Ca++.   Patient denies any hx of kidney problems that he is aware of.  No voiding difficulties, bloody urine , dysuria or LUTS.  No hx prostate issues.  No n/v/d.  Main c/o is lethargy and somnolence and some wt loss over the past month or so.  No appetite issue.   No sig use of Ca++ antiacids or OTC nsaids.    Date  Creat  eGFR  2007- 2016  1.0- 1.2 70  2017- nov '19 1.13- 1.42  11/24/18 2.48   11/27/18  2.25  12/08/18 2.91  19  12/10/18 2.91   ROS  denies CP  no joint pain   no HA  no blurry vision  no rash  no diarrhea  no nausea/ vomiting  no dysuria  no difficulty voiding  no change in urine color    Past Medical History  Past Medical History:  Diagnosis Date  . BPH (benign prostatic hypertrophy)   . Colon polyps   . GERD (gastroesophageal reflux disease)    Upper endoscopy 2003, Dr. Velora Heckler  . Hemorrhoids   . Hiatal hernia 2010  . Hyperlipidemia   . Hypertension   . Internal hemorrhoids   . Microscopic hematuria 2001   Dr.  Luanne Bras  . Pneumonia     X 2 in high school   Past Surgical History  Past Surgical History:  Procedure Laterality Date  . COLONOSCOPY  [id number]   Internal hemorrhoids, Dr. Velora Heckler  . NOSE SURGERY     Submucosal resection in the 1960s  . TONSILLECTOMY        . UPPER GASTROINTESTINAL ENDOSCOPY  2003   GERD  . WISDOM TOOTH EXTRACTION     Family History  Family History  Problem Relation Age of Onset  . Diabetes Mother   . Subarachnoid hemorrhage  Daughter   . Lung cancer Brother        smoker  . Other Paternal Grandfather        typhoid  . Heart disease Neg Hx   . Stroke Neg Hx   . Hypertension Neg Hx   . Colon cancer Neg Hx    Social History  reports that he quit smoking about 53 years ago. His smoking use included cigarettes and pipe. He has a 9.75 pack-year smoking history. He has never used smokeless tobacco. He reports current alcohol use. He reports that he does not use drugs. Allergies No Known Allergies Home medications Prior to Admission medications   Medication Sig Start Date End Date Taking? Authorizing Provider  amLODipine (NORVASC) 5 MG tablet Take 1 tablet (5 mg total) by mouth daily. 01/27/18  Yes Libby Maw, MD  Aspirin (ADULT ASPIRIN LOW STRENGTH) 81 MG EC tablet Take 81 mg by mouth daily.     Yes [provider]  loratadine (CLARITIN) 10 MG tablet Take 10 mg by mouth daily.     Yes [provider]  metoprolol succinate (TOPROL-XL) 25 MG 24 hr tablet Take  1 tablet (25 mg total) by mouth daily. 01/27/18  Yes Libby Maw, MD  Multiple Vitamin (MULTIVITAMIN) tablet Take 1 tablet by mouth daily.     Yes [provider]  omeprazole (PRILOSEC) 40 MG capsule Take 1 capsule (40 mg total) by mouth daily. 01/27/18  Yes Libby Maw, MD  polyethylene glycol powder [facility]) 17 GM/SCOOP powder Mix 1 scoop daily with water until constipation is relieved. 12/04/18  Yes Libby Maw, MD  pravastatin (PRAVACHOL) 40 MG tablet TAKE ONE-HALF TABLET BY MOUTH EVERY NIGHT AT BEDTIME 01/27/18  Yes Libby Maw, MD  vitamin B-12 (CYANOCOBALAMIN) 1000 MCG tablet Take 1,000 mcg by mouth daily.   Yes [provider]   Liver Function Tests Recent Labs  Lab 12/08/18 1353 12/08/18 1743  AST  --  32  ALT  --  32  ALKPHOS  --  79  BILITOT  --  0.8  PROT 6.1 6.8  ALBUMIN  --  3.9   No results for input(s): LIPASE, AMYLASE in the last 168  hours. CBC Recent Labs  Lab 12/08/18 1743 12/09/18 0258 12/10/18 0230  WBC 5.6 5.6 5.5  NEUTROABS 3.7  --   --   HGB 11.3* 10.0* 10.0*  HCT 35.7* 31.0* 31.1*  MCV 93.0 93.1 94.5  PLT 218 189 341   Basic Metabolic Panel Recent Labs  Lab 12/04/18 1604 12/08/18 1743 12/09/18 0258 12/10/18 0230  NA 141 141 142 141  K 4.9 4.0 3.4* 3.8  CL 105 104 109 112*  CO2 '30 26 25 22  '$ GLUCOSE 103* 110* 138* 107*  BUN 41* 49* 46* 43*  CREATININE 2.78* 2.91* 2.76* 2.91*  CALCIUM 13.2* 13.5* 12.4* 11.6*   Iron/TIBC/Ferritin/ %Sat    Component Value Date/Time   IRON 83 08/13/2017 0802   TIBC 297 08/13/2017 0802   FERRITIN 322 08/13/2017 0802   IRONPCTSAT 28 08/13/2017 0802    Vitals:   12/09/18 2141 12/10/18 0551 12/10/18 0922 12/10/18 1326  BP: (!) 153/93 (!) 161/97 (!) 149/88 140/84  Pulse: 89 89 86 79  Resp: 16 16    Temp: 99.2 F (37.3 C) 98.9 F (37.2 C) 98.6 F (37 C) 98.9 F (37.2 C)  TempSrc: Oral Oral Oral Oral  SpO2: 95% 96% 98% 98%  Weight:      Height:        Exam Gen alert, HOH, no distress, elderly WM No rash, cyanosis or gangrene Sclera anicteric, throat clear and moist  No jvd or bruits, flat neck veins Chest clear bilat, no rales or wheezing RRR no MRG Abd soft ntnd no mass or ascites +bs GU normal male MS no joint effusions or deformity Ext trace -1+ bilat pretib edema, no wounds or ulcers Neuro is alert, Ox 3 , nf, no asterixis    Home meds:  - amlodipine 5 qd/ metoprolol xl 25 qd  - pravastatin 20 qd/ aspirin 81  - omeprazole 40 qd  - prn's/ vitamins/ supplements   UA on 11/24/18 > negative  UA here is pending   Renal US pending  CXR 9/14 - no active, possible RUL nodule     Assessment/ Plan: 1. AKI on CKD3 - baseline creat 1.2- 1.4 from late 2019.  Hx HTN, no DM. UA pending and [place] pending.  Hypercalcemia known to cause AKI.  Does not look dry , BP's normal to high, slight LE edema.  UOP is good here. Would cont to treat/ lower Ca++ and  see if this  helps recovery.  Get UA, urine lytes and [place]. Cont IVF's.  Will follow. 2. Hypercalcemia - concerned for possible malignancy. PTH is low. Getting IVF's. Got 2 doses of nasal calcitonin, will order 2 more doses using SQ delivery and give pamidronate x 1 w/ pharm dosing. Myeloma studies show possible small M-spike, added some more studies to this.  3. HTN - getting norvasc and BB here, BP's stable 4. HL      Kelly Splinter  MD 12/10/2018, 2:13 PM

## 2018-12-10 NOTE — Progress Notes (Signed)
PROGRESS NOTE    Ricardo Sloan  [id number] DOB: [birth year] DOA: 12/08/2018 PCP: Libby Maw, MD   Brief Narrative: Ricardo Sloan is a 85 y.o. male with medical history significant of HTN, RUL pulmonary nodule, CKD stage 3. Patient presented secondary to worsening hypercalcemia. He was started on IV fluids and given calcitonin.   Assessment & Plan:   Principal Problem:   Hypercalcemia Active Problems:   Essential hypertension   GERD   Anemia   Lung nodules   AKI (acute kidney injury) (HCC)   Hypercalcemia Low PTH. Concerning for malignancy related hypercalcemia. Patient does not take vitamin D supplements. SPEP with mild M-spike. Calcitonin given. -Add on 25-Vitamin D -Follow-up 1,25 vitamin D, PTHrp, UPEP -Continue IV fluids  AKI on CKD stage III This appears new from one year ago. Concerning when correlating with hypercalcemia and concern for MM. Creatinine currently stable from admission with good UOP. Worsened from baseline -Nephrology consult  Anemia In setting of CKD. No evidence of bleeding. Anemia panel from over one year ago was unremarkable. Hemoglobin currently stable. -Anemia panel (iron, TIBC, ferritin)  Lung nodule Chronic and stable per report and past imaging. Concerning in setting of possible hypercalcemia of malignancy -May need repeat CT chest depending on serology reports  Chest pain Right sided, reproducible. Likely musculoskeletal but concerning in setting of possible MM. -Will obtain dedicated rib plain films to assess for lytic lesions   DVT prophylaxis: Heparin subq Code Status:   Code Status: Full Code Family Communication: Grand-daughter at bedside Disposition Plan: Discharge pending improvement of calcium and kidney function; PT eval pending   Consultants:   Nephrology  Procedures:   None  Antimicrobials:  None    Subjective: Weak, no other concerns  Objective: Vitals:   12/09/18 1313 12/09/18 2141  12/10/18 0551 12/10/18 0922  BP: 126/70 (!) 153/93 (!) 161/97 (!) 149/88  Pulse: 69 89 89 86  Resp: 16 16 16    Temp: 98.2 F (36.8 C) 99.2 F (37.3 C) 98.9 F (37.2 C) 98.6 F (37 C)  TempSrc:  Oral Oral Oral  SpO2: 98% 95% 96% 98%  Weight:      Height:        Intake/Output Summary (Last 24 hours) at 12/10/2018 1302 Last data filed at 12/10/2018 1116 Gross per 24 hour  Intake 2759.07 ml  Output 2630 ml  Net 129.07 ml   Filed Weights   12/08/18 1617 12/08/18 2311  Weight: 70.3 kg 68 kg    Examination:  General exam: Appears calm and comfortable Respiratory system: Clear to auscultation. Respiratory effort normal. Cardiovascular system: S1 & S2 heard, RRR. No murmurs, rubs, gallops or clicks. Gastrointestinal system: Abdomen is nondistended, soft and nontender. No organomegaly or masses felt. Normal bowel sounds heard. Central nervous system: Alert and oriented. No focal neurological deficits. Extremities: No edema. No calf tenderness Skin: No cyanosis. No rashes Psychiatry: Judgement and insight appear normal. Mood & affect appropriate.     Data Reviewed: I have personally reviewed following labs and imaging studies  CBC: Recent Labs  Lab 12/08/18 1743 12/09/18 0258 12/10/18 0230  WBC 5.6 5.6 5.5  NEUTROABS 3.7  --   --   HGB 11.3* 10.0* 10.0*  HCT 35.7* 31.0* 31.1*  MCV 93.0 93.1 94.5  PLT 218 189 [id number]   Basic Metabolic Panel: Recent Labs  Lab 12/04/18 1604 12/08/18 1743 12/09/18 0258 12/10/18 0230  NA 141 141 142 141  K 4.9 4.0 3.4* 3.8  CL 105 104  109 112*  CO2 30 26 25 22   GLUCOSE 103* 110* 138* 107*  BUN 41* 49* 46* 43*  CREATININE 2.78* 2.91* 2.76* 2.91*  CALCIUM 13.2* 13.5* 12.4* 11.6*   GFR: Estimated Creatinine Clearance: 16.1 mL/min (A) (by C-G formula based on SCr of 2.91 mg/dL (H)). Liver Function Tests: Recent Labs  Lab 12/08/18 1353 12/08/18 1743  AST  --  32  ALT  --  32  ALKPHOS  --  79  BILITOT  --  0.8  PROT 6.1 6.8   ALBUMIN  --  3.9   No results for input(s): LIPASE, AMYLASE in the last 168 hours. No results for input(s): AMMONIA in the last 168 hours. Coagulation Profile: No results for input(s): INR, PROTIME in the last 168 hours. Cardiac Enzymes: No results for input(s): CKTOTAL, CKMB, CKMBINDEX, TROPONINI in the last 168 hours. BNP (last 3 results) No results for input(s): PROBNP in the last 8760 hours. HbA1C: No results for input(s): HGBA1C in the last 72 hours. CBG: No results for input(s): GLUCAP in the last 168 hours. Lipid Profile: No results for input(s): CHOL, HDL, LDLCALC, TRIG, CHOLHDL, LDLDIRECT in the last 72 hours. Thyroid Function Tests: No results for input(s): TSH, T4TOTAL, FREET4, T3FREE, THYROIDAB in the last 72 hours. Anemia Panel: No results for input(s): VITAMINB12, FOLATE, FERRITIN, TIBC, IRON, RETICCTPCT in the last 72 hours. Sepsis Labs: No results for input(s): PROCALCITON, LATICACIDVEN in the last 168 hours.  Recent Results (from the past 240 hour(s))  SARS Coronavirus 2 [facility] order, Performed in [facility] hospital lab) Nasopharyngeal Nasopharyngeal Swab     Status: None   Collection Time: 12/08/18  8:15 PM   Specimen: Nasopharyngeal Swab  Result Value Ref Range Status   SARS Coronavirus 2 NEGATIVE NEGATIVE Final    Comment: (NOTE) If result is NEGATIVE SARS-CoV-2 target nucleic acids are NOT DETECTED. The SARS-CoV-2 RNA is generally detectable in upper and lower  respiratory specimens during the acute phase of infection. The lowest  concentration of SARS-CoV-2 viral copies this assay can detect is 250  copies / mL. A negative result does not preclude SARS-CoV-2 infection  and should not be used as the sole basis for treatment or other  patient management decisions.  A negative result may occur with  improper specimen collection / handling, submission of specimen other  than nasopharyngeal swab, presence of viral mutation(s) within the  areas targeted  by this assay, and inadequate number of viral copies  (<250 copies / mL). A negative result must be combined with clinical  observations, patient history, and epidemiological information. If result is POSITIVE SARS-CoV-2 target nucleic acids are DETECTED. The SARS-CoV-2 RNA is generally detectable in upper and lower  respiratory specimens dur ing the acute phase of infection.  Positive  results are indicative of active infection with SARS-CoV-2.  Clinical  correlation with patient history and other diagnostic information is  necessary to determine patient infection status.  Positive results do  not rule out bacterial infection or co-infection with other viruses. If result is PRESUMPTIVE POSTIVE SARS-CoV-2 nucleic acids MAY BE PRESENT.   A presumptive positive result was obtained on the submitted specimen  and confirmed on repeat testing.  While 2019 novel coronavirus  (SARS-CoV-2) nucleic acids may be present in the submitted sample  additional confirmatory testing may be necessary for epidemiological  and / or clinical management purposes  to differentiate between  SARS-CoV-2 and other Sarbecovirus currently known to infect humans.  If clinically indicated additional testing with  an alternate test  methodology [phone]) is advised. The SARS-CoV-2 RNA is generally  detectable in upper and lower respiratory sp ecimens during the acute  phase of infection. The expected result is Negative. Fact Sheet for Patients:  StrictlyIdeas.no Fact Sheet for Healthcare Providers: BankingDealers.co.za This test is not yet approved or cleared by the [place] FDA and has been authorized for detection and/or diagnosis of SARS-CoV-2 by FDA under an Emergency Use Authorization (EUA).  This EUA will remain in effect (meaning this test can be used) for the duration of the COVID-19 declaration under Section 564(b)(1) of the Act, 21 U.S.C. section  360bbb-3(b)(1), unless the authorization is terminated or revoked sooner. Performed at [facility], [address], [place], [place] [address]          Radiology Studies: Dg Chest [place] 1 View  Result Date: 12/08/2018 CLINICAL DATA:  Hypercalcemia workup, weakness EXAM: PORTABLE CHEST 1 VIEW COMPARISON:  04/25/2017, CT chest 04/10/2017 FINDINGS: No focal opacity or pleural effusion. Normal cardiomediastinal silhouette with aortic atherosclerosis. No pneumothorax. Possible small lung nodule in the right upper lobe. IMPRESSION: No active disease. Possible small lung nodule in the right upper lobe which may be evaluated with follow-up CT. Electronically Signed   By: Donavan Foil M.D.   On: 12/08/2018 23:08        Scheduled Meds: . amLODipine  5 mg Oral Daily  . aspirin EC  81 mg Oral Daily  . docusate sodium  100 mg Oral BID  . heparin  5,000 Units Subcutaneous Q8H  . metoprolol succinate  25 mg Oral Daily  . pantoprazole  80 mg Oral Daily  . pravastatin  20 mg Oral q1800   Continuous Infusions: . sodium chloride 20 mL/hr at 12/10/18 1116     LOS: 2 days     Cordelia Poche, MD [facility] 12/10/2018, 1:02 PM  If 7PM-7AM, please contact night-coverage [email or web address]

## 2018-12-11 ENCOUNTER — Inpatient Hospital Stay (HOSPITAL_COMMUNITY): Payer: Medicare HMO

## 2018-12-11 LAB — UPEP/UIFE/LIGHT CHAINS/TP, 24-HR UR
% BETA, Urine: 33.5 %
ALPHA 1 URINE: 4.4 %
Albumin, U: 22 %
Alpha 2, Urine: 18.6 %
Free Kappa Lt Chains,Ur: 100.95 mg/L (ref 0.63–113.79)
Free Kappa/Lambda Ratio: 12.37 (ref 1.03–31.76)
Free Lambda Lt Chains,Ur: 8.16 mg/L (ref 0.47–11.77)
GAMMA GLOBULIN URINE: 21.5 %
Total Protein, Urine-Ur/day: 133 mg/24 hr (ref 30–150)
Total Protein, Urine: 11.1 mg/dL
Total Volume: 1200

## 2018-12-11 LAB — IRON AND TIBC
Iron: 28 ug/dL — ABNORMAL LOW (ref 45–182)
Saturation Ratios: 11 % — ABNORMAL LOW (ref 17.9–39.5)
TIBC: 258 ug/dL (ref 250–450)
UIBC: 230 ug/dL

## 2018-12-11 LAB — PTH, INTACT AND CALCIUM
Calcium, Total (PTH): 12.5 mg/dL — ABNORMAL HIGH (ref 8.6–10.2)
PTH: 11 pg/mL — ABNORMAL LOW (ref 15–65)

## 2018-12-11 LAB — BASIC METABOLIC PANEL
Anion gap: 10 (ref 5–15)
BUN: 34 mg/dL — ABNORMAL HIGH (ref 8–23)
CO2: 20 mmol/L — ABNORMAL LOW (ref 22–32)
Calcium: 11.1 mg/dL — ABNORMAL HIGH (ref 8.9–10.3)
Chloride: 114 mmol/L — ABNORMAL HIGH (ref 98–111)
Creatinine, Ser: 2.72 mg/dL — ABNORMAL HIGH (ref 0.61–1.24)
GFR calc Af Amer: 24 mL/min — ABNORMAL LOW (ref >60)
GFR calc non Af Amer: 20 mL/min — ABNORMAL LOW (ref >60)
Glucose, Bld: 113 mg/dL — ABNORMAL HIGH (ref 70–99)
Potassium: 3.6 mmol/L (ref 3.5–5.1)
Sodium: 144 mmol/L (ref 135–145)

## 2018-12-11 LAB — KAPPA/LAMBDA LIGHT CHAINS
Kappa free light chain: 31.2 mg/L — ABNORMAL HIGH (ref 3.3–19.4)
Kappa, lambda light chain ratio: 1.47 (ref 0.26–1.65)
Lambda free light chains: 21.2 mg/L (ref 5.7–26.3)

## 2018-12-11 LAB — FERRITIN: Ferritin: 507 ng/mL — ABNORMAL HIGH (ref 24–336)

## 2018-12-11 LAB — CALCIUM, IONIZED: Calcium, Ionized, Serum: 7.2 mg/dL — ABNORMAL HIGH (ref 4.5–5.6)

## 2018-12-11 MED ORDER — SODIUM CHLORIDE 0.45 % IV SOLN
INTRAVENOUS | Status: DC
  Administered 2018-12-11 – 2018-12-12 (×4): via INTRAVENOUS

## 2018-12-11 NOTE — Progress Notes (Signed)
[facility] Progress Note  Subjective: c/o nausea today, no other c/o, denies any SOB.  Ca down 11.1, creat 2.7 today.  3.1 L UOP yest and 3.6 L in.   Vitals:   12/10/18 2208 12/11/18 0507 12/11/18 0652 12/11/18 0954  BP: (!) 157/95 (!) 146/88  (!) 159/97  Pulse: 99 96  91  Resp: 16 16  16   Temp: 98.6 F (37 C) 98.4 F (36.9 C)  98.1 F (36.7 C)  TempSrc: Oral Oral  Oral  SpO2: 96% 95%  97%  Weight:   74.3 kg   Height:        Inpatient medications: . amLODipine  5 mg Oral Daily  . aspirin EC  81 mg Oral Daily  . docusate sodium  100 mg Oral BID  . heparin  5,000 Units Subcutaneous Q8H  . metoprolol succinate  25 mg Oral Daily  . pantoprazole  80 mg Oral Daily  . pravastatin  20 mg Oral q1800   . sodium chloride 100 mL/hr at 12/11/18 0801   acetaminophen **OR** acetaminophen, ondansetron **OR** ondansetron (ZOFRAN) IV, polyethylene glycol    Exam: Gen alert, HOH, no distress, elderly WM No rash, cyanosis or gangrene Sclera anicteric, throat clear and moist  No jvd or bruits, flat neck veins Chest clear bilat, no rales or wheezing RRR no MRG Abd soft ntnd no mass or ascites +bs GU normal male MS no joint effusions or deformity Ext trace -1+ bilat pretib edema, no wounds or ulcers Neuro is alert, Ox 3 , nf, no asterixis    Home meds:  - amlodipine 5 qd/ metoprolol xl 25 qd  - pravastatin 20 qd/ aspirin 81  - omeprazole 40 qd  - prn's/ vitamins/ supplements   UA on 11/24/18 > negative  UA 9/16 - negative  UNa 93, UCr 58   Renal US 13cm / 11 cm kidneys , ^echo and L sided hydronephrosis. Splenic mass, ? Hemangioma per report.    CXR 9/14 - no active, possible RUL nodule     Assessment/ Plan: 1. AKI on CKD3 - baseline creat 1.2- 1.4 from late 2019.  Hx HTN, no DM. UA pending and [place] pending.  Hypercalcemia known to cause AKI.  L-sided hydro per renal US. Could have pelvic malignancy causing ureteral obstruction L side, would recommend  noncon abd/ pelv CT.  Will d/w PMD.  Cont IVF's 2. Hypernatremia - changed to 1/2 NS IVF"s 3. Hypercalcemia - concerned for malignancy. PTH is low. Getting final doses of calcitonin today and has rec'd pamidronate x 1. Myeloma studies show possible small M-spike, added some more myeloma studies to this.  Lower IVF 100/hr and taper down 4. HTN - getting norvasc and BB here, BP's stable 5. HL    Rob Galina Haddox 12/11/2018, 11:57 AM  Iron/TIBC/Ferritin/ %Sat    Component Value Date/Time   IRON 28 (L) 12/11/2018 0804   TIBC 258 12/11/2018 0804   FERRITIN 507 (H) 12/11/2018 0804   IRONPCTSAT 11 (L) 12/11/2018 0804   IRONPCTSAT 28 08/13/2017 0802   Recent Labs  Lab 12/08/18 1743  12/11/18 0243  NA 141   < > 144  K 4.0   < > 3.6  CL 104   < > 114*  CO2 26   < > 20*  GLUCOSE 110*   < > 113*  BUN 49*   < > 34*  CREATININE 2.91*   < > 2.72*  CALCIUM 13.5*   < > 11.1*  ALBUMIN 3.9  --   --    < > =  values in this interval not displayed.   Recent Labs  Lab 12/08/18 1743  AST 32  ALT 32  ALKPHOS 79  BILITOT 0.8  PROT 6.8   Recent Labs  Lab 12/10/18 0230  WBC 5.5  HGB 10.0*  HCT 31.1*  PLT 171

## 2018-12-11 NOTE — Progress Notes (Signed)
PROGRESS NOTE    KACEE Sloan  [id number] DOB: 07/30/1931 DOA: 12/08/2018 PCP: Libby Maw, MD   Brief Narrative: Ricardo Sloan is a 85 y.o. male with medical history significant of HTN, RUL pulmonary nodule, CKD stage 3. Patient presented secondary to worsening hypercalcemia. He was started on IV fluids and given calcitonin.   Assessment & Plan:   Principal Problem:   Hypercalcemia Active Problems:   Essential hypertension   GERD   Anemia   Lung nodules   AKI (acute kidney injury) (HCC)   Hypercalcemia Low PTH. Concerning for malignancy related hypercalcemia. Patient does not take vitamin D supplements. SPEP with mild M-spike. Calcitonin given. Also given pamidronate by nephrology. Elevated 1,25 vitamin D. -Follow-up 25 vitamin D, PTHrp, UPEP in addition to kappa/lambda, electropharesis -Nephrology recommendations: switch to 1/2 NS IV fluids -CT chest/abdomen/pelvis  AKI on CKD stage III This appears new from one year ago. Concerning when correlating with hypercalcemia and concern for MM. Creatinine currently stable from admission with good UOP. Worsened from baseline. Ultrasound significant for left-sided hydronephrosis. -Nephrology recommendations -CT abdomen/pelvis  Anemia In setting of CKD. No evidence of bleeding. Anemia panel from over one year ago was unremarkable. Hemoglobin currently stable. Current anemia panel suggests anemia of chronic disease.  Lung nodule Chronic and stable per report and past imaging. Concerning in setting of possible hypercalcemia of malignancy -CT chest as mentioned above  Chest pain Right sided, reproducible. Likely musculoskeletal but concerning in setting of possible MM.  Nausea/vomiting Unknown etiology. Resolved from earlier -CT abdomen as mentioned above   DVT prophylaxis: Heparin subq Code Status:   Code Status: Full Code Family Communication: Grand-daughter at bedside Disposition Plan: Discharge  pending improvement of calcium and kidney function   Consultants:   Nephrology  Procedures:   None  Antimicrobials:  None    Subjective: Had some vomiting today with associated nausea.  Objective: Vitals:   12/11/18 0507 12/11/18 0652 12/11/18 0954 12/11/18 1349  BP: (!) 146/88  (!) 159/97 (!) 159/92  Pulse: 96  91 80  Resp: 16  16 16   Temp: 98.4 F (36.9 C)  98.1 F (36.7 C) 97.7 F (36.5 C)  TempSrc: Oral  Oral Oral  SpO2: 95%  97% 99%  Weight:  74.3 kg    Height:        Intake/Output Summary (Last 24 hours) at 12/11/2018 1527 Last data filed at 12/11/2018 1400 Gross per 24 hour  Intake 3283.37 ml  Output 2150 ml  Net 1133.37 ml   Filed Weights   12/08/18 1617 12/08/18 2311 12/11/18 0652  Weight: 70.3 kg 68 kg 74.3 kg    Examination:  General exam: Appears calm and comfortable Respiratory system: Clear to auscultation. Respiratory effort normal. Cardiovascular system: S1 & S2 heard, RRR. No murmurs, rubs, gallops or clicks. Gastrointestinal system: Abdomen is nondistended, soft and nontender. No organomegaly or masses felt. Normal bowel sounds heard. Central nervous system: Alert and oriented. No focal neurological deficits. Extremities: No edema. No calf tenderness Skin: No cyanosis. No rashes Psychiatry: Judgement and insight appear normal. Mood & affect appropriate.     Data Reviewed: I have personally reviewed following labs and imaging studies  CBC: Recent Labs  Lab 12/08/18 1743 12/09/18 0258 12/10/18 0230  WBC 5.6 5.6 5.5  NEUTROABS 3.7  --   --   HGB 11.3* 10.0* 10.0*  HCT 35.7* 31.0* 31.1*  MCV 93.0 93.1 94.5  PLT 218 189 [id number]   Basic Metabolic Panel: Recent Labs  Lab 12/04/18 1604 12/08/18 1743 12/09/18 0258 12/10/18 0230 12/11/18 0243  NA 141 141 142 141 144  K 4.9 4.0 3.4* 3.8 3.6  CL 105 104 109 112* 114*  CO2 30 26 25 22  20*  GLUCOSE 103* 110* 138* 107* 113*  BUN 41* 49* 46* 43* 34*  CREATININE 2.78* 2.91* 2.76*  2.91* 2.72*  CALCIUM 13.2* 13.5* 12.4*  12.5* 11.6* 11.1*   GFR: Estimated Creatinine Clearance: 18.7 mL/min (A) (by C-G formula based on SCr of 2.72 mg/dL (H)). Liver Function Tests: Recent Labs  Lab 12/08/18 1353 12/08/18 1743  AST  --  32  ALT  --  32  ALKPHOS  --  79  BILITOT  --  0.8  PROT 6.1 6.8  ALBUMIN  --  3.9   No results for input(s): LIPASE, AMYLASE in the last 168 hours. No results for input(s): AMMONIA in the last 168 hours. Coagulation Profile: No results for input(s): INR, PROTIME in the last 168 hours. Cardiac Enzymes: No results for input(s): CKTOTAL, CKMB, CKMBINDEX, TROPONINI in the last 168 hours. BNP (last 3 results) No results for input(s): PROBNP in the last 8760 hours. HbA1C: No results for input(s): HGBA1C in the last 72 hours. CBG: No results for input(s): GLUCAP in the last 168 hours. Lipid Profile: No results for input(s): CHOL, HDL, LDLCALC, TRIG, CHOLHDL, LDLDIRECT in the last 72 hours. Thyroid Function Tests: No results for input(s): TSH, T4TOTAL, FREET4, T3FREE, THYROIDAB in the last 72 hours. Anemia Panel: Recent Labs    12/11/18 0804  FERRITIN 507*  TIBC 258  IRON 28*   Sepsis Labs: No results for input(s): PROCALCITON, LATICACIDVEN in the last 168 hours.  Recent Results (from the past 240 hour(s))  SARS Coronavirus 2 [facility] order, Performed in [facility] hospital lab) Nasopharyngeal Nasopharyngeal Swab     Status: None   Collection Time: 12/08/18  8:15 PM   Specimen: Nasopharyngeal Swab  Result Value Ref Range Status   SARS Coronavirus 2 NEGATIVE NEGATIVE Final    Comment: (NOTE) If result is NEGATIVE SARS-CoV-2 target nucleic acids are NOT DETECTED. The SARS-CoV-2 RNA is generally detectable in upper and lower  respiratory specimens during the acute phase of infection. The lowest  concentration of SARS-CoV-2 viral copies this assay can detect is 250  copies / mL. A negative result does not preclude SARS-CoV-2 infection   and should not be used as the sole basis for treatment or other  patient management decisions.  A negative result may occur with  improper specimen collection / handling, submission of specimen other  than nasopharyngeal swab, presence of viral mutation(s) within the  areas targeted by this assay, and inadequate number of viral copies  (<250 copies / mL). A negative result must be combined with clinical  observations, patient history, and epidemiological information. If result is POSITIVE SARS-CoV-2 target nucleic acids are DETECTED. The SARS-CoV-2 RNA is generally detectable in upper and lower  respiratory specimens dur ing the acute phase of infection.  Positive  results are indicative of active infection with SARS-CoV-2.  Clinical  correlation with patient history and other diagnostic information is  necessary to determine patient infection status.  Positive results do  not rule out bacterial infection or co-infection with other viruses. If result is PRESUMPTIVE POSTIVE SARS-CoV-2 nucleic acids MAY BE PRESENT.   A presumptive positive result was obtained on the submitted specimen  and confirmed on repeat testing.  While 2019 novel coronavirus  (SARS-CoV-2) nucleic acids may be present in  the submitted sample  additional confirmatory testing may be necessary for epidemiological  and / or clinical management purposes  to differentiate between  SARS-CoV-2 and other Sarbecovirus currently known to infect humans.  If clinically indicated additional testing with an alternate test  methodology [phone]) is advised. The SARS-CoV-2 RNA is generally  detectable in upper and lower respiratory sp ecimens during the acute  phase of infection. The expected result is Negative. Fact Sheet for Patients:  StrictlyIdeas.no Fact Sheet for Healthcare Providers: BankingDealers.co.za This test is not yet approved or cleared by the [place] FDA and  has been authorized for detection and/or diagnosis of SARS-CoV-2 by FDA under an Emergency Use Authorization (EUA).  This EUA will remain in effect (meaning this test can be used) for the duration of the COVID-19 declaration under Section 564(b)(1) of the Act, 21 U.S.C. section 360bbb-3(b)(1), unless the authorization is terminated or revoked sooner. Performed at [facility], [address] [place], [place] [address]          Radiology Studies: Dg Ribs Unilateral W/chest Right  Result Date: 12/10/2018 CLINICAL DATA:  Chest pain. EXAM: RIGHT RIBS AND CHEST - 3+ VIEW COMPARISON:  Radiographs of December 08, 2018. FINDINGS: No fracture or other bone lesions are seen involving the ribs. There is no evidence of pneumothorax or pleural effusion. As noted on recent x-ray, nodular density is seen in right upper lobe. Heart size and mediastinal contours are within normal limits. IMPRESSION: Normal right ribs. No pneumothorax or pleural effusion is noted. As noted on recent x-ray, nodular density is noted in right upper lobe, and CT scan of the chest is recommended to rule out pulmonary nodule or mass. Electronically Signed   By: Marijo Conception M.D.   On: 12/10/2018 14:54   US Renal  Result Date: 12/10/2018 CLINICAL DATA:  Acute renal injury with left flank pain EXAM: RENAL / URINARY TRACT ULTRASOUND COMPLETE COMPARISON:  None. FINDINGS: Right Kidney: Renal measurements: 11.9 x 4.3 x 4.7 cm. = volume: 125 mL. Diffuse increased echogenicity is noted. No mass lesion or hydronephrosis is noted. Left Kidney: Renal measurements: 13.6 x 6.2 x 7.6 cm. = volume: 429 mL. Moderate hydronephrosis is seen. Mild increased echogenicity is noted. Bladder: Bladder is well distended. Bilateral pleural effusions and mild ascites is seen. Spleen is mildly prominent with some diffuse heterogeneity within the central portion suggestive of underlying mass. Contrast enhanced CT may be helpful. Alternatively MRI  on a nonemergent basis could be performed. IMPRESSION: Left-sided hydronephrosis. Bilateral effusions and mild ascites. Increased echogenicity in the right kidney is noted consistent with medical renal disease. Mild increased echogenicity is noted on the left. Findings suspicious for a mass within the spleen. This may simply represent a large hemangioma. Contrast enhanced CT or MRI may be helpful for further evaluation. Electronically Signed   By: Inez Catalina M.D.   On: 12/10/2018 20:08        Scheduled Meds: . amLODipine  5 mg Oral Daily  . aspirin EC  81 mg Oral Daily  . docusate sodium  100 mg Oral BID  . heparin  5,000 Units Subcutaneous Q8H  . metoprolol succinate  25 mg Oral Daily  . pantoprazole  80 mg Oral Daily  . pravastatin  20 mg Oral q1800   Continuous Infusions: . sodium chloride 100 mL/hr at 12/11/18 1400     LOS: 3 days     Cordelia Poche, MD [facility] 12/11/2018, 3:27 PM  If 7PM-7AM,  please contact night-coverage [email or web address]

## 2018-12-11 NOTE — Evaluation (Signed)
Physical Therapy Evaluation Patient Details Name: Ricardo Sloan MRN: [id number] DOB: 01-11-1932 Today's Date: 12/11/2018   History of Present Illness  Pt admitted with c/o fatigue/weakness and dx with renal failure  Clinical Impression  Pt admitted as above and presenting with functional mobility limitations 2* generalized weakness and ambulatory balance deficits.  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  Pt's granddaughter in room states they are looking into hired assist at home vs ALF for grandparents.  Pt should progress to dc home if needed level of assist for himself and spouse has been arranged.    Follow Up Recommendations Home health PT    Equipment Recommendations  Rolling walker with 5" wheels    Recommendations for Other Services       Precautions / Restrictions Precautions Precautions: Fall Restrictions Weight Bearing Restrictions: No      Mobility  Bed Mobility Overal bed mobility: Modified Independent             General bed mobility comments: Pt unassisted to move to EOB sitting  Transfers Overall transfer level: Needs assistance Equipment used: None Transfers: Sit to/from Stand Sit to Stand: Min assist;Min guard         General transfer comment: steady assist  Ambulation/Gait Ambulation/Gait assistance: Min assist;Min guard Gait Distance (Feet): 400 Feet Assistive device: Rolling walker (2 wheeled);None Gait Pattern/deviations: Step-through pattern;Decreased step length - right;Decreased step length - left;Shuffle;Trunk flexed;Wide base of support Gait velocity: decr   General Gait Details: Pt ambulated 53' with HHA for stability.  Pt ambulated additional 350 with RW, min guard assist and cues for posture and position from [employer]            Wheelchair Mobility    Modified Rankin (Stroke Patients Only)       Balance Overall balance assessment: Needs  assistance Sitting-balance support: No upper extremity supported;Feet supported Sitting balance-Leahy Scale: Normal     Standing balance support: No upper extremity supported Standing balance-Leahy Scale: Fair                               Pertinent Vitals/Pain Pain Assessment: No/denies pain    Home Living Family/patient expects to be discharged to:: Private residence Living Arrangements: Spouse/significant other Available Help at Discharge: Family(grandaughters and son from [place]) Type of Home: House Home Access: Stairs to enter Entrance Stairs-Rails: None Technical brewer of Steps: 2(has portable ramp if needed) Home Layout: One level Home Equipment: None Additional Comments: 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    Prior Function Level of Independence: Independent               Hand Dominance        Extremity/Trunk Assessment   Upper Extremity Assessment Upper Extremity Assessment: Generalized weakness    Lower Extremity Assessment Lower Extremity Assessment: Generalized weakness       Communication   Communication: No difficulties  Cognition Arousal/Alertness: Awake/alert Behavior During Therapy: Flat affect;WFL for tasks assessed/performed Overall Cognitive Status: Within Functional Limits for tasks assessed                                        General Comments      Exercises     Assessment/Plan    PT Assessment Patient needs continued  PT services  PT Problem List Decreased strength;Decreased activity tolerance;Decreased balance;Decreased mobility;Decreased knowledge of use of DME       PT Treatment Interventions DME instruction;Gait training;Stair training;Functional mobility training;Therapeutic activities;Therapeutic exercise;Patient/family education    PT Goals (Current goals can be found in the Care Plan section)  Acute Rehab PT Goals Patient Stated Goal: Regain strength to care for spouse PT Goal  Formulation: With patient Time For Goal Achievement: 12/18/18 Potential to Achieve Goals: Good    Frequency Min 3X/week   Barriers to discharge Decreased caregiver support Family from out of town currently assisting pt's wife; 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 : A Little 6 Click Score: 20    End of Session Equipment Utilized During Treatment: Gait belt Activity Tolerance: Patient tolerated treatment well;Patient limited by fatigue Patient left: in chair;with call bell/phone within reach;with chair alarm set;with family/visitor present Nurse Communication: Mobility status PT Visit Diagnosis: Difficulty in walking, not elsewhere classified (R26.2);Muscle weakness (generalized) (M62.81)    Time: [id number] PT Time Calculation (min) (ACUTE ONLY): 21 min   Charges:   PT Evaluation $PT Eval Low Complexity: 1 Low          [place] Pager [phone] Office [phone]   Attila Mccarthy 12/11/2018, 12:34 PM

## 2018-12-12 ENCOUNTER — Inpatient Hospital Stay (HOSPITAL_COMMUNITY): Payer: Medicare HMO

## 2018-12-12 LAB — PROTEIN ELECTRO, RANDOM URINE
Albumin ELP, Urine: 21.6 %
Alpha-1-Globulin, U: 7.5 %
Alpha-2-Globulin, U: 19.9 %
Beta Globulin, U: 36 %
Gamma Globulin, U: 15 %
Total Protein, Urine: 10.7 mg/dL

## 2018-12-12 LAB — BASIC METABOLIC PANEL
Anion gap: 9 (ref 5–15)
BUN: 33 mg/dL — ABNORMAL HIGH (ref 8–23)
CO2: 20 mmol/L — ABNORMAL LOW (ref 22–32)
Calcium: 10.1 mg/dL (ref 8.9–10.3)
Chloride: 112 mmol/L — ABNORMAL HIGH (ref 98–111)
Creatinine, Ser: 2.47 mg/dL — ABNORMAL HIGH (ref 0.61–1.24)
GFR calc Af Amer: 27 mL/min — ABNORMAL LOW (ref >60)
GFR calc non Af Amer: 23 mL/min — ABNORMAL LOW (ref >60)
Glucose, Bld: 97 mg/dL (ref 70–99)
Potassium: 3.6 mmol/L (ref 3.5–5.1)
Sodium: 141 mmol/L (ref 135–145)

## 2018-12-12 LAB — IMMUNOFIXATION ELECTROPHORESIS
IgA: 143 mg/dL (ref 61–437)
IgG (Immunoglobin G), Serum: 508 mg/dL — ABNORMAL LOW (ref 603–1613)
IgM (Immunoglobulin M), Srm: 45 mg/dL (ref 15–143)
Total Protein ELP: 5.3 g/dL — ABNORMAL LOW (ref 6.0–8.5)

## 2018-12-12 LAB — VITAMIN D 25 HYDROXY (VIT D DEFICIENCY, FRACTURES): Vit D, 25-Hydroxy: 34.7 ng/mL (ref 30.0–100.0)

## 2018-12-12 MED ORDER — LIP MEDEX EX OINT
TOPICAL_OINTMENT | CUTANEOUS | Status: AC
  Filled 2018-12-12: qty 7

## 2018-12-12 MED ORDER — TECHNETIUM TC 99M MEDRONATE IV KIT
21.2000 | PACK | Freq: Once | INTRAVENOUS | Status: AC
  Administered 2018-12-12: 21.2 via INTRAVENOUS

## 2018-12-12 NOTE — Plan of Care (Signed)
Plan of care discussed with patient and granddaughter.   All questions answered at this time.   Will continue to monitor and round on patient.

## 2018-12-12 NOTE — Progress Notes (Signed)
CSW met with the patient and granddaughter at bedside to discuss [place]. Patient requested CSW return at a different time to discuss. TOC staff will return at different time to discuss Home health options.

## 2018-12-12 NOTE — Progress Notes (Signed)
[facility] Progress Note  Subjective: Ca down 10.1, creat down 2.4 today.   Vitals:   12/11/18 1349 12/11/18 2116 12/12/18 0547 12/12/18 1338  BP: (!) 159/92 (!) 147/81 (!) 149/88 (!) 160/91  Pulse: 80 85 75 75  Resp: 16 16 16 16   Temp: 97.7 F (36.5 C) 98.7 F (37.1 C) 98.3 F (36.8 C) 98.1 F (36.7 C)  TempSrc: Oral Oral Oral Oral  SpO2: 99% 97% 94% 97%  Weight:   72.9 kg   Height:        Inpatient medications: . amLODipine  5 mg Oral Daily  . aspirin EC  81 mg Oral Daily  . docusate sodium  100 mg Oral BID  . heparin  5,000 Units Subcutaneous Q8H  . metoprolol succinate  25 mg Oral Daily  . pantoprazole  80 mg Oral Daily  . pravastatin  20 mg Oral q1800   . sodium chloride 100 mL/hr at 12/12/18 1500   acetaminophen **OR** acetaminophen, ondansetron **OR** ondansetron (ZOFRAN) IV, polyethylene glycol    Exam: Gen alert, HOH, no distress, elderly WM No rash, cyanosis or gangrene Sclera anicteric, throat clear and moist  No jvd or bruits, flat neck veins Chest clear bilat, no rales or wheezing RRR no MRG Abd soft ntnd no mass or ascites +bs GU normal male MS no joint effusions or deformity Ext trace -1+ bilat pretib edema, no wounds or ulcers Neuro is alert, Ox 3 , nf, no asterixis    Home meds:  - amlodipine 5 qd/ metoprolol xl 25 qd  - pravastatin 20 qd/ aspirin 81  - omeprazole 40 qd  - prn's/ vitamins/ supplements   UA on 11/24/18 > negative  UA 9/16 - negative  UNa 93, UCr 58   Renal US 13cm / 11 cm kidneys , ^echo and L sided hydronephrosis. Splenic mass, ? Hemangioma per report.    CXR 9/14 - no active, possible RUL nodule     Assessment/ Plan: 1. AKI on CKD3 - baseline creat 1.2- 1.4 from late 2019.  Hx HTN, no DM. UA negative and renal US normal except for mild L hydro.  Hypercalcemia known to cause AKI. CT abd/pelv did not show any pelvic mass or cause for L hydronephrosis. Hopefully renal fxn can continue to improve w/  Ca++ levels down. F/u creat in am.  Will follow.   2. Hypercalcemia - concerned for malignancy. PTH is low. SP calcitonin and has rec'd pamidronate x 1. Would taper off IVF's over next 48 hrs. Encourage po intake.  Myeloma studies not impressive but M-spike noted. Defer to ONC/ primary.  3. HTN - getting norvasc and BB here, BP's stable 4. HL    Rob Tawna Alwin 12/12/2018, 5:18 PM  Iron/TIBC/Ferritin/ %Sat    Component Value Date/Time   IRON 28 (L) 12/11/2018 0804   TIBC 258 12/11/2018 0804   FERRITIN 507 (H) 12/11/2018 0804   IRONPCTSAT 11 (L) 12/11/2018 0804   IRONPCTSAT 28 08/13/2017 0802   Recent Labs  Lab 12/08/18 1743  12/12/18 0246  NA 141   < > 141  K 4.0   < > 3.6  CL 104   < > 112*  CO2 26   < > 20*  GLUCOSE 110*   < > 97  BUN 49*   < > 33*  CREATININE 2.91*   < > 2.47*  CALCIUM 13.5*   < > 10.1  ALBUMIN 3.9  --   --    < > = values  in this interval not displayed.   Recent Labs  Lab 12/08/18 1743  AST 32  ALT 32  ALKPHOS 79  BILITOT 0.8  PROT 6.8   Recent Labs  Lab 12/10/18 0230  WBC 5.5  HGB 10.0*  HCT 31.1*  PLT 171

## 2018-12-12 NOTE — Progress Notes (Signed)
Physical Therapy Treatment Patient Details Name: Ricardo Sloan MRN: [id number] DOB: May 16, 1931 Today's Date: 12/12/2018    History of Present Illness Pt admitted with c/o fatigue/weakness and dx with renal failure    PT Comments    Pt reports today he feels tired and mild nausea.   Did agree to amb.  Tolerated amb full unit.  General Gait Details: pt requested to use walker for mild unsteadiness and feeling "bad" today.  Otherwise, good alternating gait.   Follow Up Recommendations  Home health PT     Equipment Recommendations  Rolling walker with 5" wheels    Recommendations for Other Services       Precautions / Restrictions Precautions Precautions: Fall Restrictions Weight Bearing Restrictions: No    Mobility  Bed Mobility Overal bed mobility: Modified Independent             General bed mobility comments: increased time  Transfers Overall transfer level: Needs assistance Equipment used: None Transfers: Sit to/from Stand Sit to Stand: Min assist;Min guard         General transfer comment: steady assist  Ambulation/Gait Ambulation/Gait assistance: Min assist;Min guard Gait Distance (Feet): 500 Feet Assistive device: Rolling walker (2 wheeled) Gait Pattern/deviations: Step-through pattern;Decreased step length - right;Decreased step length - left;Shuffle;Trunk flexed;Wide base of support Gait velocity: decreased   General Gait Details: pt requested to use walker for mild unsteadiness and feeling "bad" today.  Otherwise, good alternating gait.   Stairs             Wheelchair Mobility    Modified Rankin (Stroke Patients Only)       Balance                                            Cognition Arousal/Alertness: Awake/alert Behavior During Therapy: WFL for tasks assessed/performed                                   General Comments: feeling "tired" and some nausea today      Exercises       General Comments        Pertinent Vitals/Pain      Home Living                      Prior Function            PT Goals (current goals can now be found in the care plan section) Progress towards PT goals: Progressing toward goals    Frequency    Min 3X/week      PT Plan Current plan remains appropriate    Co-evaluation              AM-PAC PT "6 Clicks" Mobility   Outcome Measure  Help needed turning from your back to your side while in a flat bed without using bedrails?: None   Help needed moving to and from a bed to a chair (including a wheelchair)?: A Little Help needed standing up from a chair using your arms (e.g., wheelchair or bedside chair)?: A Little Help needed to walk in hospital room?: A Little Help needed climbing 3-5 steps with a railing? : A Little 6 Click Score: 16    End of Session Equipment Utilized During Treatment: Gait belt Activity Tolerance: Patient limited  by fatigue Patient left: in bed;with call bell/phone within reach Nurse Communication: Mobility status PT Visit Diagnosis: Difficulty in walking, not elsewhere classified (R26.2);Muscle weakness (generalized) (M62.81)     Time: [id number] PT Time Calculation (min) (ACUTE ONLY): 13 min  Charges:  $Gait Training: 8-22 mins                     Rica Koyanagi  PTA Acute  Rehabilitation Services Pager      [phone] Office      [phone]

## 2018-12-12 NOTE — Care Management Important Message (Signed)
Important Message  Patient Details IM Letter given to Kathrin Greathouse SW to present to the Patient Name: Ricardo Sloan MRN: [id number] Date of Birth: November 16, 1931   Medicare Important Message Given:  Yes     Kerin Salen 12/12/2018, 11:55 AM

## 2018-12-12 NOTE — Progress Notes (Signed)
Discussed visitor policy with patient and current family member.   Family member stated the visitor policy had been discussed with the previously. Ricardo Sloan is at the bedside now. [name] was at the bedside this morning.     There have been two separate family members in patient's room today.   I asked that the please decide who the designated family member is at this time.       I stated understanding that this is difficult but that we must hold each patient and family to the same standard.  Patient and family stated understanding.   Will ask family to decide and designate by the end of the day.

## 2018-12-12 NOTE — Progress Notes (Signed)
Paged attending MD per patient/granddaughter's request.   Patient does not remember plan of care.   I answered some questions but they would still like to speak to attending physician again.      Will continue to monitor patient.

## 2018-12-12 NOTE — Progress Notes (Signed)
PROGRESS NOTE    Ricardo Sloan  [id number] DOB: [birth year] DOA: 12/08/2018 PCP: Libby Maw, MD   Brief Narrative: Ricardo Sloan is a 85 y.o. male with medical history significant of HTN, RUL pulmonary nodule, CKD stage 3. Patient presented secondary to worsening hypercalcemia. He was started on IV fluids and given calcitonin.   Assessment & Plan:   Principal Problem:   Hypercalcemia Active Problems:   Essential hypertension   GERD   Anemia   Lung nodules   AKI (acute kidney injury) (HCC)   Hypercalcemia Low PTH. Concerning for malignancy related hypercalcemia. Patient does not take vitamin D supplements. SPEP with mild M-spike. Calcitonin given. Also given pamidronate by nephrology. Elevated 1,25 vitamin D with normal 25 vitamin D. Elevated kappa free light chains. Calcium now in normal range. CT chest significant for 10 mm spiculated nodule in addition to difficult to discern splenic mass -Follow-up PTHrp -Nephrology recommendations: pending today  AKI on CKD stage III This appears new from one year ago. Concerning when correlating with hypercalcemia and concern for MM. Creatinine currently stable from admission with good UOP. Worsened from baseline. Ultrasound significant for left-sided hydronephrosis. CT scan significant for non-obstructing left sided nephrolithiasis. Creatinine trending down. -Nephrology recommendations  Anemia In setting of CKD. No evidence of bleeding. Anemia panel from over one year ago was unremarkable. Hemoglobin currently stable. Current anemia panel suggests anemia of chronic disease.  Lung nodule Chronic and stable per report and past imaging. Concerning in setting of possible hypercalcemia of malignancy -CT chest as mentioned above  Chest pain Right sided, reproducible. Likely musculoskeletal but concerning in setting of possible MM.  Nausea/vomiting Recurrent. Possibly related to hypercalcemia. Patient also with poor  appetite.   DVT prophylaxis: Heparin subq Code Status:   Code Status: Full Code Family Communication: Grand-daughter at bedside Disposition Plan: Discharge pending improvement of calcium and kidney function   Consultants:   Nephrology  Procedures:   None  Antimicrobials:  None    Subjective: Patient states he had dry heaves this morning and feels weak.  Objective: Vitals:   12/11/18 0954 12/11/18 1349 12/11/18 2116 12/12/18 0547  BP: (!) 159/97 (!) 159/92 (!) 147/81 (!) 149/88  Pulse: 91 80 85 75  Resp: 16 16 16 16   Temp: 98.1 F (36.7 C) 97.7 F (36.5 C) 98.7 F (37.1 C) 98.3 F (36.8 C)  TempSrc: Oral Oral Oral Oral  SpO2: 97% 99% 97% 94%  Weight:    72.9 kg  Height:        Intake/Output Summary (Last 24 hours) at 12/12/2018 1007 Last data filed at 12/12/2018 0532 Gross per 24 hour  Intake 2510.81 ml  Output 1470 ml  Net 1040.81 ml   Filed Weights   12/08/18 2311 12/11/18 0652 12/12/18 0547  Weight: 68 kg 74.3 kg 72.9 kg    Examination:  General exam: Appears calm and comfortable Respiratory system: Clear to auscultation. Respiratory effort normal. Cardiovascular system: S1 & S2 heard, RRR. No murmurs, rubs, gallops or clicks. Gastrointestinal system: Abdomen is nondistended, soft and nontender. No organomegaly or masses felt. Normal bowel sounds heard. Central nervous system: Alert and oriented. No focal neurological deficits. Extremities: No edema. No calf tenderness Skin: No cyanosis. No rashes Psychiatry: Judgement and insight appear normal. Mood & affect appropriate.      Data Reviewed: I have personally reviewed following labs and imaging studies  CBC: Recent Labs  Lab 12/08/18 1743 12/09/18 0258 12/10/18 0230  WBC 5.6 5.6 5.5  NEUTROABS  3.7  --   --   HGB 11.3* 10.0* 10.0*  HCT 35.7* 31.0* 31.1*  MCV 93.0 93.1 94.5  PLT 218 189 [id number]   Basic Metabolic Panel: Recent Labs  Lab 12/08/18 1743 12/09/18 0258 12/10/18 0230  12/11/18 0243 12/12/18 0246  NA 141 142 141 144 141  K 4.0 3.4* 3.8 3.6 3.6  CL 104 109 112* 114* 112*  CO2 26 25 22  20* 20*  GLUCOSE 110* 138* 107* 113* 97  BUN 49* 46* 43* 34* 33*  CREATININE 2.91* 2.76* 2.91* 2.72* 2.47*  CALCIUM 13.5* 12.4*   12.5* 11.6* 11.1* 10.1   GFR: Estimated Creatinine Clearance: 19 mL/min (A) (by C-G formula based on SCr of 2.47 mg/dL (H)). Liver Function Tests: Recent Labs  Lab 12/08/18 1353 12/08/18 1743  AST  --  32  ALT  --  32  ALKPHOS  --  79  BILITOT  --  0.8  PROT 6.1 6.8  ALBUMIN  --  3.9   No results for input(s): LIPASE, AMYLASE in the last 168 hours. No results for input(s): AMMONIA in the last 168 hours. Coagulation Profile: No results for input(s): INR, PROTIME in the last 168 hours. Cardiac Enzymes: No results for input(s): CKTOTAL, CKMB, CKMBINDEX, TROPONINI in the last 168 hours. BNP (last 3 results) No results for input(s): PROBNP in the last 8760 hours. HbA1C: No results for input(s): HGBA1C in the last 72 hours. CBG: No results for input(s): GLUCAP in the last 168 hours. Lipid Profile: No results for input(s): CHOL, HDL, LDLCALC, TRIG, CHOLHDL, LDLDIRECT in the last 72 hours. Thyroid Function Tests: No results for input(s): TSH, T4TOTAL, FREET4, T3FREE, THYROIDAB in the last 72 hours. Anemia Panel: Recent Labs    12/11/18 0804  FERRITIN 507*  TIBC 258  IRON 28*   Sepsis Labs: No results for input(s): PROCALCITON, LATICACIDVEN in the last 168 hours.  Recent Results (from the past 240 hour(s))  SARS Coronavirus 2 [facility] order, Performed in [facility] hospital lab) Nasopharyngeal Nasopharyngeal Swab     Status: None   Collection Time: 12/08/18  8:15 PM   Specimen: Nasopharyngeal Swab  Result Value Ref Range Status   SARS Coronavirus 2 NEGATIVE NEGATIVE Final    Comment: (NOTE) If result is NEGATIVE SARS-CoV-2 target nucleic acids are NOT DETECTED. The SARS-CoV-2 RNA is generally detectable in upper and  lower  respiratory specimens during the acute phase of infection. The lowest  concentration of SARS-CoV-2 viral copies this assay can detect is 250  copies / mL. A negative result does not preclude SARS-CoV-2 infection  and should not be used as the sole basis for treatment or other  patient management decisions.  A negative result may occur with  improper specimen collection / handling, submission of specimen other  than nasopharyngeal swab, presence of viral mutation(s) within the  areas targeted by this assay, and inadequate number of viral copies  (<250 copies / mL). A negative result must be combined with clinical  observations, patient history, and epidemiological information. If result is POSITIVE SARS-CoV-2 target nucleic acids are DETECTED. The SARS-CoV-2 RNA is generally detectable in upper and lower  respiratory specimens dur ing the acute phase of infection.  Positive  results are indicative of active infection with SARS-CoV-2.  Clinical  correlation with patient history and other diagnostic information is  necessary to determine patient infection status.  Positive results do  not rule out bacterial infection or co-infection with other viruses. If result is PRESUMPTIVE POSTIVE SARS-CoV-2  nucleic acids MAY BE PRESENT.   A presumptive positive result was obtained on the submitted specimen  and confirmed on repeat testing.  While 2019 novel coronavirus  (SARS-CoV-2) nucleic acids may be present in the submitted sample  additional confirmatory testing may be necessary for epidemiological  and / or clinical management purposes  to differentiate between  SARS-CoV-2 and other Sarbecovirus currently known to infect humans.  If clinically indicated additional testing with an alternate test  methodology [phone]) is advised. The SARS-CoV-2 RNA is generally  detectable in upper and lower respiratory sp ecimens during the acute  phase of infection. The expected result is  Negative. Fact Sheet for Patients:  StrictlyIdeas.no Fact Sheet for Healthcare Providers: BankingDealers.co.za This test is not yet approved or cleared by the [place] FDA and has been authorized for detection and/or diagnosis of SARS-CoV-2 by FDA under an Emergency Use Authorization (EUA).  This EUA will remain in effect (meaning this test can be used) for the duration of the COVID-19 declaration under Section 564(b)(1) of the Act, 21 U.S.C. section 360bbb-3(b)(1), unless the authorization is terminated or revoked sooner. Performed at [facility], [address] [place], [place] [address]          Radiology Studies: Ct Abdomen Pelvis Wo Contrast  Result Date: 12/11/2018 CLINICAL DATA:  Known left-sided hydronephrosis and chronic renal disease EXAM: CT CHEST, ABDOMEN AND PELVIS WITHOUT CONTRAST TECHNIQUE: Multidetector CT imaging of the chest, abdomen and pelvis was performed following the standard protocol without IV contrast. COMPARISON:  04/10/2017 FINDINGS: CT CHEST FINDINGS Cardiovascular: Thoracic aorta demonstrates atherosclerotic calcifications without aneurysmal dilatation. Coronary calcifications are noted. No cardiac enlargement is seen. Mediastinum/Nodes: Thoracic inlet is within normal limits. No hilar or mediastinal adenopathy is noted. The esophagus as visualized is within normal limits. Lungs/Pleura: Lungs are well aerated bilaterally with mild emphysematous changes. Small bilateral pleural effusions are seen left greater than right. Mild left lower lobe atelectasis is noted. Scarring in the right middle lobe and lingula on the left are again seen and stable. Multiple nodules are noted throughout both lungs increased in appearance when compared with the prior exam. This is most notable in the right upper lobe where a 10 mm nodule is noted best seen on image number 57 of series 4. Scattered smaller nodules are seen.  Previously noted ground-glass attenuation area in the posterior right upper lobe has improved but demonstrates some residual nodular changes. The remainder of the nodular densities measure less than 5 mm. Musculoskeletal: Degenerative changes of the thoracic spine are noted. No lytic or sclerotic lesions are seen. Extrapleural soft tissue densities are noted along the left lateral aspect of the sternum stable in appearance from the prior exam from 2019 again likely benign in etiology. CT ABDOMEN PELVIS FINDINGS Hepatobiliary: Somewhat limited due to lack of IV contrast. Stable hepatic cyst is noted. The gallbladder is within normal limits. Pancreas: Atrophic changes of the pancreas are seen. Spleen: Spleen is enlarged in size. Evaluation is somewhat limited due to lack of IV contrast. A mixed attenuation lesion is identified which measures at least 10 cm in greatest dimension. This would correspond with that seen on prior ultrasound examination. In retrospect, this was likely present on the prior exam of 2019 but again incompletely evaluated because of lack of IV contrast. Previously it measured just over 5 cm. Adrenals/Urinary Tract: Adrenal glands are within normal limits. The right kidney demonstrates no focal abnormality. The left kidney demonstrates some renal calculi. Left-sided hydronephrosis  is seen although the ureter appears to be within normal limits. No ureteral calculi are noted. The bladder is well distended. Stomach/Bowel: The appendix is within normal limits. Colon is filled with fecal material although no obstructive changes are seen. Small bowel and stomach appear within normal limits. Vascular/Lymphatic: Aortic atherosclerosis. No enlarged abdominal or pelvic lymph nodes. Reproductive: Prostate is unremarkable. Other: Free fluid is noted within the pelvis. Free fluid also surrounds the liver. Musculoskeletal: Degenerative changes of the lumbar spine are seen. No lytic or sclerotic lesions are  noted. IMPRESSION: CT of the chest: Scattered nodular densities bilaterally which have increased in the interval from the prior exam particularly in the right upper lobe where a 10 mm mildly spiculated nodule is seen as described. Small pleural effusions are noted as well. Outpatient PET-CT may be helpful for further evaluation. Soft tissue densities are noted along the left lateral aspect of the sternum stable in appearance from the prior exam and likely benign in etiology. CT of the abdomen and pelvis: Mixed attenuation mass lesion within the spleen as described. This is larger than seen on prior examination and may simply represent an enlarged hemangioma. The evaluation is somewhat limited due to lack of IV contrast. More aggressive process could not be totally excluded. Left renal calculi with evidence of left-sided hydronephrosis. This is new from the prior exam but similar to that seen on recent ultrasound. No obstructing lesion or stone is noted. Mild ascites. Electronically Signed   By: Inez Catalina M.D.   On: 12/11/2018 19:52   Dg Ribs Unilateral W/chest Right  Result Date: 12/10/2018 CLINICAL DATA:  Chest pain. EXAM: RIGHT RIBS AND CHEST - 3+ VIEW COMPARISON:  Radiographs of December 08, 2018. FINDINGS: No fracture or other bone lesions are seen involving the ribs. There is no evidence of pneumothorax or pleural effusion. As noted on recent x-ray, nodular density is seen in right upper lobe. Heart size and mediastinal contours are within normal limits. IMPRESSION: Normal right ribs. No pneumothorax or pleural effusion is noted. As noted on recent x-ray, nodular density is noted in right upper lobe, and CT scan of the chest is recommended to rule out pulmonary nodule or mass. Electronically Signed   By: Marijo Conception M.D.   On: 12/10/2018 14:54   Ct Chest Wo Contrast  Result Date: 12/11/2018 CLINICAL DATA:  Known left-sided hydronephrosis and chronic renal disease EXAM: CT CHEST, ABDOMEN AND  PELVIS WITHOUT CONTRAST TECHNIQUE: Multidetector CT imaging of the chest, abdomen and pelvis was performed following the standard protocol without IV contrast. COMPARISON:  04/10/2017 FINDINGS: CT CHEST FINDINGS Cardiovascular: Thoracic aorta demonstrates atherosclerotic calcifications without aneurysmal dilatation. Coronary calcifications are noted. No cardiac enlargement is seen. Mediastinum/Nodes: Thoracic inlet is within normal limits. No hilar or mediastinal adenopathy is noted. The esophagus as visualized is within normal limits. Lungs/Pleura: Lungs are well aerated bilaterally with mild emphysematous changes. Small bilateral pleural effusions are seen left greater than right. Mild left lower lobe atelectasis is noted. Scarring in the right middle lobe and lingula on the left are again seen and stable. Multiple nodules are noted throughout both lungs increased in appearance when compared with the prior exam. This is most notable in the right upper lobe where a 10 mm nodule is noted best seen on image number 57 of series 4. Scattered smaller nodules are seen. Previously noted ground-glass attenuation area in the posterior right upper lobe has improved but demonstrates some residual nodular changes. The remainder of the nodular  densities measure less than 5 mm. Musculoskeletal: Degenerative changes of the thoracic spine are noted. No lytic or sclerotic lesions are seen. Extrapleural soft tissue densities are noted along the left lateral aspect of the sternum stable in appearance from the prior exam from 2019 again likely benign in etiology. CT ABDOMEN PELVIS FINDINGS Hepatobiliary: Somewhat limited due to lack of IV contrast. Stable hepatic cyst is noted. The gallbladder is within normal limits. Pancreas: Atrophic changes of the pancreas are seen. Spleen: Spleen is enlarged in size. Evaluation is somewhat limited due to lack of IV contrast. A mixed attenuation lesion is identified which measures at least 10 cm  in greatest dimension. This would correspond with that seen on prior ultrasound examination. In retrospect, this was likely present on the prior exam of 2019 but again incompletely evaluated because of lack of IV contrast. Previously it measured just over 5 cm. Adrenals/Urinary Tract: Adrenal glands are within normal limits. The right kidney demonstrates no focal abnormality. The left kidney demonstrates some renal calculi. Left-sided hydronephrosis is seen although the ureter appears to be within normal limits. No ureteral calculi are noted. The bladder is well distended. Stomach/Bowel: The appendix is within normal limits. Colon is filled with fecal material although no obstructive changes are seen. Small bowel and stomach appear within normal limits. Vascular/Lymphatic: Aortic atherosclerosis. No enlarged abdominal or pelvic lymph nodes. Reproductive: Prostate is unremarkable. Other: Free fluid is noted within the pelvis. Free fluid also surrounds the liver. Musculoskeletal: Degenerative changes of the lumbar spine are seen. No lytic or sclerotic lesions are noted. IMPRESSION: CT of the chest: Scattered nodular densities bilaterally which have increased in the interval from the prior exam particularly in the right upper lobe where a 10 mm mildly spiculated nodule is seen as described. Small pleural effusions are noted as well. Outpatient PET-CT may be helpful for further evaluation. Soft tissue densities are noted along the left lateral aspect of the sternum stable in appearance from the prior exam and likely benign in etiology. CT of the abdomen and pelvis: Mixed attenuation mass lesion within the spleen as described. This is larger than seen on prior examination and may simply represent an enlarged hemangioma. The evaluation is somewhat limited due to lack of IV contrast. More aggressive process could not be totally excluded. Left renal calculi with evidence of left-sided hydronephrosis. This is new from the  prior exam but similar to that seen on recent ultrasound. No obstructing lesion or stone is noted. Mild ascites. Electronically Signed   By: Inez Catalina M.D.   On: 12/11/2018 19:52   US Renal  Result Date: 12/10/2018 CLINICAL DATA:  Acute renal injury with left flank pain EXAM: RENAL / URINARY TRACT ULTRASOUND COMPLETE COMPARISON:  None. FINDINGS: Right Kidney: Renal measurements: 11.9 x 4.3 x 4.7 cm. = volume: 125 mL. Diffuse increased echogenicity is noted. No mass lesion or hydronephrosis is noted. Left Kidney: Renal measurements: 13.6 x 6.2 x 7.6 cm. = volume: 429 mL. Moderate hydronephrosis is seen. Mild increased echogenicity is noted. Bladder: Bladder is well distended. Bilateral pleural effusions and mild ascites is seen. Spleen is mildly prominent with some diffuse heterogeneity within the central portion suggestive of underlying mass. Contrast enhanced CT may be helpful. Alternatively MRI on a nonemergent basis could be performed. IMPRESSION: Left-sided hydronephrosis. Bilateral effusions and mild ascites. Increased echogenicity in the right kidney is noted consistent with medical renal disease. Mild increased echogenicity is noted on the left. Findings suspicious for a mass within the spleen.  This may simply represent a large hemangioma. Contrast enhanced CT or MRI may be helpful for further evaluation. Electronically Signed   By: Inez Catalina M.D.   On: 12/10/2018 20:08        Scheduled Meds:  amLODipine  5 mg Oral Daily   aspirin EC  81 mg Oral Daily   docusate sodium  100 mg Oral BID   heparin  5,000 Units Subcutaneous Q8H   metoprolol succinate  25 mg Oral Daily   pantoprazole  80 mg Oral Daily   pravastatin  20 mg Oral q1800   Continuous Infusions:  sodium chloride 100 mL/hr at 12/12/18 [id number]     LOS: 4 days     Cordelia Poche, MD [facility] 12/12/2018, 10:07 AM  If 7PM-7AM, please contact night-coverage [email or web address]

## 2018-12-13 DIAGNOSIS — Z7189 Other specified counseling: Secondary | ICD-10-CM

## 2018-12-13 DIAGNOSIS — Z515 Encounter for palliative care: Secondary | ICD-10-CM

## 2018-12-13 LAB — BASIC METABOLIC PANEL
Anion gap: 7 (ref 5–15)
BUN: 34 mg/dL — ABNORMAL HIGH (ref 8–23)
CO2: 21 mmol/L — ABNORMAL LOW (ref 22–32)
Calcium: 9.5 mg/dL (ref 8.9–10.3)
Chloride: 110 mmol/L (ref 98–111)
Creatinine, Ser: 2.39 mg/dL — ABNORMAL HIGH (ref 0.61–1.24)
GFR calc Af Amer: 28 mL/min — ABNORMAL LOW (ref >60)
GFR calc non Af Amer: 24 mL/min — ABNORMAL LOW (ref >60)
Glucose, Bld: 96 mg/dL (ref 70–99)
Potassium: 3.4 mmol/L — ABNORMAL LOW (ref 3.5–5.1)
Sodium: 138 mmol/L (ref 135–145)

## 2018-12-13 LAB — ANGIOTENSIN CONVERTING ENZYME: Angiotensin-Converting Enzyme: 26 U/L (ref 14–82)

## 2018-12-13 MED ORDER — ONDANSETRON HCL 4 MG PO TABS: 4.0000 mg | ORAL_TABLET | Freq: Four times a day (QID) | ORAL | 0 refills | Status: AC | PRN

## 2018-12-13 NOTE — Discharge Summary (Signed)
Physician Discharge Summary  Ricardo Sloan [id number] DOB: 28-Oct-1931 DOA: 12/08/2018  PCP: Libby Maw, MD  Admit date: 12/08/2018 Discharge date: 12/13/2018  Admitted From: Home Disposition: Home  Recommendations for Outpatient Follow-up:  1. Follow up with PCP in 1 week 2. Oncology and nephrology follow-up 3. Please obtain BMP/CBC in one week 4. Please follow up on the following pending results: PTHrp, angiotensin converting enzyme  Home Health: PT, OT Equipment/Devices: Rolling walker  Discharge Condition: Stable CODE STATUS: Full code Diet recommendation: Heart healthy   Brief/Interim Summary:  Admission HPI written by Jennette Kettle, MD   Chief Complaint: Hypercalcemia  HPI: Ricardo Sloan is a 86 y.o. male with medical history significant of HTN, RUL pulmonary nodule that wasn't doing much on imaging for many years, CKD stage 3 at baseline.  Patient presents to the ED from PCP for worsening hypercalcemia.  Lab work drawn 4 days ago showed an elevated calcium and elevated kidney function. Calcium up to 13.2 up from 12.4 a week before that. Creatinine up to 2.8 up from 2.3, baseline 1.4 last year.  Patient has had some constipation, mild confusion, and generalized weakness.  No fevers no chills.   ED Course: Calcium 13.5, Creat 2.91, BUN 49.  Patient denies taking any Vit D supplements.    Hospital course:  Hypercalcemia Low PTH. Concerning for malignancy related hypercalcemia. Patient does not take vitamin D supplements. SPEP with mild M-spike. UPEP unremarkable. Calcitonin given. Also given pamidronate by nephrology. Elevated 1,25 vitamin D with normal 25 vitamin D. Elevated kappa free light chains. Calcium now in normal range. CT chest significant for 10 mm spiculated nodule in addition to difficult to discern splenic mass PTHrp and angiotensin converting enzyme pending. Bone scan did not reveal concerning lesions. Discussed with  oncology who suggested possible primary lung cancer vs possible lymphoma. Unlikely myeloma.  AKI on CKD stage III This appears new from one year ago. Baseline at that time was around 1.2-1.4. Concerning when correlating with hypercalcemia and concern for MM. Kidney injury managed with IV fluids. Patient with left-sided hydronephrosis as well likely in setting of possibly kidney stone. Creatinine peak of 2.91 and trended down to 2.39 prior to discharge. Outpatient follow-up with nephrology.   Anemia In setting of CKD. No evidence of bleeding. Anemia panel from over one year ago was unremarkable. Hemoglobin currently stable. Current anemia panel suggests anemia of chronic disease.  Lung nodule Chronic and stable per report and past imaging. Concerning in setting of possible hypercalcemia of malignancy. Nodule appears spiculated as mentioned above and has increased in size.  Chest pain Right sided, reproducible. Likely musculoskeletal but concerning in setting of possible MM.  Nausea/vomiting Recurrent. Likely related to hypercalcemia. Patient also with poor appetite. Improved slightly prior to discharge. Recommend OTC  Discharge Diagnoses:  Principal Problem:   Hypercalcemia Active Problems:   Essential hypertension   GERD   Anemia   Lung nodules   AKI (acute kidney injury) [facility])    Discharge Instructions  Discharge Instructions    Increase activity slowly   Complete by: As directed      Allergies as of 12/13/2018   No Known Allergies     Medication List    TAKE these medications   Adult Aspirin Low Strength 81 MG EC tablet Generic drug: Aspirin Take 81 mg by mouth daily.   amLODipine 5 MG tablet Commonly known as: NORVASC Take 1 tablet (5 mg total) by mouth daily.   loratadine 10  MG tablet Commonly known as: CLARITIN Take 10 mg by mouth daily.   metoprolol succinate 25 MG 24 hr tablet Commonly known as: TOPROL-XL Take 1 tablet (25 mg total) by mouth daily.    multivitamin tablet Take 1 tablet by mouth daily.   omeprazole 40 MG capsule Commonly known as: PRILOSEC Take 1 capsule (40 mg total) by mouth daily.   ondansetron 4 MG tablet Commonly known as: ZOFRAN Take 1 tablet (4 mg total) by mouth every 6 (six) hours as needed for nausea.   polyethylene glycol powder 17 GM/SCOOP powder Commonly known as: GLYCOLAX/MIRALAX Mix 1 scoop daily with water until constipation is relieved.   pravastatin 40 MG tablet Commonly known as: PRAVACHOL TAKE ONE-HALF TABLET BY MOUTH EVERY NIGHT AT BEDTIME   vitamin B-12 1000 MCG tablet Commonly known as: CYANOCOBALAMIN Take 1,000 mcg by mouth daily.            Durable Medical Equipment  (From admission, onward)         Start     Ordered   12/13/18 1618  For home use only DME Walker rolling  Once    Question Answer Comment  Patient needs a walker to treat with the following condition Hypercalcemia   Patient needs a walker to treat with the following condition Weakness      12/13/18 1617         Follow-up Information    Brunetta Genera, MD Follow up.   Specialties: Hematology, Oncology Why: Hospital follow-up for concern of occult cancer. Contact information: [place] [address] [phone]        Roney Jaffe, MD. Call in 3 week(s).   Specialty: Nephrology Contact information: [place] [address] [phone]          No Known Allergies  Consultations:  Nephrology  Oncology (telephone)   Procedures/Studies: Ct Abdomen Pelvis Wo Contrast  Result Date: 12/11/2018 CLINICAL DATA:  Known left-sided hydronephrosis and chronic renal disease EXAM: CT CHEST, ABDOMEN AND PELVIS WITHOUT CONTRAST TECHNIQUE: Multidetector CT imaging of the chest, abdomen and pelvis was performed following the standard protocol without IV contrast. COMPARISON:  04/10/2017 FINDINGS: CT CHEST FINDINGS Cardiovascular: Thoracic aorta demonstrates  atherosclerotic calcifications without aneurysmal dilatation. Coronary calcifications are noted. No cardiac enlargement is seen. Mediastinum/Nodes: Thoracic inlet is within normal limits. No hilar or mediastinal adenopathy is noted. The esophagus as visualized is within normal limits. Lungs/Pleura: Lungs are well aerated bilaterally with mild emphysematous changes. Small bilateral pleural effusions are seen left greater than right. Mild left lower lobe atelectasis is noted. Scarring in the right middle lobe and lingula on the left are again seen and stable. Multiple nodules are noted throughout both lungs increased in appearance when compared with the prior exam. This is most notable in the right upper lobe where a 10 mm nodule is noted best seen on image number 57 of series 4. Scattered smaller nodules are seen. Previously noted ground-glass attenuation area in the posterior right upper lobe has improved but demonstrates some residual nodular changes. The remainder of the nodular densities measure less than 5 mm. Musculoskeletal: Degenerative changes of the thoracic spine are noted. No lytic or sclerotic lesions are seen. Extrapleural soft tissue densities are noted along the left lateral aspect of the sternum stable in appearance from the prior exam from 2019 again likely benign in etiology. CT ABDOMEN PELVIS FINDINGS Hepatobiliary: Somewhat limited due to lack of IV contrast. Stable hepatic cyst is noted. The gallbladder  is within normal limits. Pancreas: Atrophic changes of the pancreas are seen. Spleen: Spleen is enlarged in size. Evaluation is somewhat limited due to lack of IV contrast. A mixed attenuation lesion is identified which measures at least 10 cm in greatest dimension. This would correspond with that seen on prior ultrasound examination. In retrospect, this was likely present on the prior exam of 2019 but again incompletely evaluated because of lack of IV contrast. Previously it measured just over 5  cm. Adrenals/Urinary Tract: Adrenal glands are within normal limits. The right kidney demonstrates no focal abnormality. The left kidney demonstrates some renal calculi. Left-sided hydronephrosis is seen although the ureter appears to be within normal limits. No ureteral calculi are noted. The bladder is well distended. Stomach/Bowel: The appendix is within normal limits. Colon is filled with fecal material although no obstructive changes are seen. Small bowel and stomach appear within normal limits. Vascular/Lymphatic: Aortic atherosclerosis. No enlarged abdominal or pelvic lymph nodes. Reproductive: Prostate is unremarkable. Other: Free fluid is noted within the pelvis. Free fluid also surrounds the liver. Musculoskeletal: Degenerative changes of the lumbar spine are seen. No lytic or sclerotic lesions are noted. IMPRESSION: CT of the chest: Scattered nodular densities bilaterally which have increased in the interval from the prior exam particularly in the right upper lobe where a 10 mm mildly spiculated nodule is seen as described. Small pleural effusions are noted as well. Outpatient PET-CT may be helpful for further evaluation. Soft tissue densities are noted along the left lateral aspect of the sternum stable in appearance from the prior exam and likely benign in etiology. CT of the abdomen and pelvis: Mixed attenuation mass lesion within the spleen as described. This is larger than seen on prior examination and may simply represent an enlarged hemangioma. The evaluation is somewhat limited due to lack of IV contrast. More aggressive process could not be totally excluded. Left renal calculi with evidence of left-sided hydronephrosis. This is new from the prior exam but similar to that seen on recent ultrasound. No obstructing lesion or stone is noted. Mild ascites. Electronically Signed   By: Inez Catalina M.D.   On: 12/11/2018 19:52   Dg Ribs Unilateral W/chest Right  Result Date: 12/10/2018 CLINICAL DATA:   Chest pain. EXAM: RIGHT RIBS AND CHEST - 3+ VIEW COMPARISON:  Radiographs of December 08, 2018. FINDINGS: No fracture or other bone lesions are seen involving the ribs. There is no evidence of pneumothorax or pleural effusion. As noted on recent x-ray, nodular density is seen in right upper lobe. Heart size and mediastinal contours are within normal limits. IMPRESSION: Normal right ribs. No pneumothorax or pleural effusion is noted. As noted on recent x-ray, nodular density is noted in right upper lobe, and CT scan of the chest is recommended to rule out pulmonary nodule or mass. Electronically Signed   By: Marijo Conception M.D.   On: 12/10/2018 14:54   Ct Chest Wo Contrast  Result Date: 12/11/2018 CLINICAL DATA:  Known left-sided hydronephrosis and chronic renal disease EXAM: CT CHEST, ABDOMEN AND PELVIS WITHOUT CONTRAST TECHNIQUE: Multidetector CT imaging of the chest, abdomen and pelvis was performed following the standard protocol without IV contrast. COMPARISON:  04/10/2017 FINDINGS: CT CHEST FINDINGS Cardiovascular: Thoracic aorta demonstrates atherosclerotic calcifications without aneurysmal dilatation. Coronary calcifications are noted. No cardiac enlargement is seen. Mediastinum/Nodes: Thoracic inlet is within normal limits. No hilar or mediastinal adenopathy is noted. The esophagus as visualized is within normal limits. Lungs/Pleura: Lungs are well aerated bilaterally with  mild emphysematous changes. Small bilateral pleural effusions are seen left greater than right. Mild left lower lobe atelectasis is noted. Scarring in the right middle lobe and lingula on the left are again seen and stable. Multiple nodules are noted throughout both lungs increased in appearance when compared with the prior exam. This is most notable in the right upper lobe where a 10 mm nodule is noted best seen on image number 57 of series 4. Scattered smaller nodules are seen. Previously noted ground-glass attenuation area in the  posterior right upper lobe has improved but demonstrates some residual nodular changes. The remainder of the nodular densities measure less than 5 mm. Musculoskeletal: Degenerative changes of the thoracic spine are noted. No lytic or sclerotic lesions are seen. Extrapleural soft tissue densities are noted along the left lateral aspect of the sternum stable in appearance from the prior exam from 2019 again likely benign in etiology. CT ABDOMEN PELVIS FINDINGS Hepatobiliary: Somewhat limited due to lack of IV contrast. Stable hepatic cyst is noted. The gallbladder is within normal limits. Pancreas: Atrophic changes of the pancreas are seen. Spleen: Spleen is enlarged in size. Evaluation is somewhat limited due to lack of IV contrast. A mixed attenuation lesion is identified which measures at least 10 cm in greatest dimension. This would correspond with that seen on prior ultrasound examination. In retrospect, this was likely present on the prior exam of 2019 but again incompletely evaluated because of lack of IV contrast. Previously it measured just over 5 cm. Adrenals/Urinary Tract: Adrenal glands are within normal limits. The right kidney demonstrates no focal abnormality. The left kidney demonstrates some renal calculi. Left-sided hydronephrosis is seen although the ureter appears to be within normal limits. No ureteral calculi are noted. The bladder is well distended. Stomach/Bowel: The appendix is within normal limits. Colon is filled with fecal material although no obstructive changes are seen. Small bowel and stomach appear within normal limits. Vascular/Lymphatic: Aortic atherosclerosis. No enlarged abdominal or pelvic lymph nodes. Reproductive: Prostate is unremarkable. Other: Free fluid is noted within the pelvis. Free fluid also surrounds the liver. Musculoskeletal: Degenerative changes of the lumbar spine are seen. No lytic or sclerotic lesions are noted. IMPRESSION: CT of the chest: Scattered nodular  densities bilaterally which have increased in the interval from the prior exam particularly in the right upper lobe where a 10 mm mildly spiculated nodule is seen as described. Small pleural effusions are noted as well. Outpatient PET-CT may be helpful for further evaluation. Soft tissue densities are noted along the left lateral aspect of the sternum stable in appearance from the prior exam and likely benign in etiology. CT of the abdomen and pelvis: Mixed attenuation mass lesion within the spleen as described. This is larger than seen on prior examination and may simply represent an enlarged hemangioma. The evaluation is somewhat limited due to lack of IV contrast. More aggressive process could not be totally excluded. Left renal calculi with evidence of left-sided hydronephrosis. This is new from the prior exam but similar to that seen on recent ultrasound. No obstructing lesion or stone is noted. Mild ascites. Electronically Signed   By: Inez Catalina M.D.   On: 12/11/2018 19:52   Nm Bone Scan Whole Body  Result Date: 12/12/2018 CLINICAL DATA:  Abdominal neoplasm. Concern for MM. Current pain right clavicle. EXAM: NUCLEAR MEDICINE WHOLE BODY BONE SCAN TECHNIQUE: Whole body anterior and posterior images were obtained approximately 3 hours after intravenous injection of radiopharmaceutical. RADIOPHARMACEUTICALS:  21.2 mCi Technetium-76m MDP  IV COMPARISON:  CT 12/11/2018 FINDINGS: Exam demonstrates 2 adjacent foci of increased radiotracer activity over the right anterior third and fourth rib ends likely posttraumatic. Small focus of increased tracer activity over the soft tissues just above the left hip lateral to the iliac bone seen only on the anterior images without corresponding abnormality on CT as this may be artifactual. No focal abnormality over the right clavicle to explain patient's right clavicular pain. Minimal symmetric uptake over the shoulders, elbows, knees and ankles compatible with degenerative  changes. Increased tracer activity over dilated left intrarenal collecting system/renal pelvis as seen on CT. IMPRESSION: 1. No focal abnormality over the right clavicle to account for patient's pain. 2. Two adjacent foci of increased tracer activity over the right third and fourth anterior rib ends likely posttraumatic as metastatic disease is much less likely. 3.  Degenerative changes of the appendicular skeleton. 4.  Left hydronephrosis. Electronically Signed   By: Marin Olp M.D.   On: 12/12/2018 16:21   US Renal  Result Date: 12/10/2018 CLINICAL DATA:  Acute renal injury with left flank pain EXAM: RENAL / URINARY TRACT ULTRASOUND COMPLETE COMPARISON:  None. FINDINGS: Right Kidney: Renal measurements: 11.9 x 4.3 x 4.7 cm. = volume: 125 mL. Diffuse increased echogenicity is noted. No mass lesion or hydronephrosis is noted. Left Kidney: Renal measurements: 13.6 x 6.2 x 7.6 cm. = volume: 429 mL. Moderate hydronephrosis is seen. Mild increased echogenicity is noted. Bladder: Bladder is well distended. Bilateral pleural effusions and mild ascites is seen. Spleen is mildly prominent with some diffuse heterogeneity within the central portion suggestive of underlying mass. Contrast enhanced CT may be helpful. Alternatively MRI on a nonemergent basis could be performed. IMPRESSION: Left-sided hydronephrosis. Bilateral effusions and mild ascites. Increased echogenicity in the right kidney is noted consistent with medical renal disease. Mild increased echogenicity is noted on the left. Findings suspicious for a mass within the spleen. This may simply represent a large hemangioma. Contrast enhanced CT or MRI may be helpful for further evaluation. Electronically Signed   By: Inez Catalina M.D.   On: 12/10/2018 20:08   Dg Chest Port 1 View  Result Date: 12/08/2018 CLINICAL DATA:  Hypercalcemia workup, weakness EXAM: PORTABLE CHEST 1 VIEW COMPARISON:  04/25/2017, CT chest 04/10/2017 FINDINGS: No focal opacity or  pleural effusion. Normal cardiomediastinal silhouette with aortic atherosclerosis. No pneumothorax. Possible small lung nodule in the right upper lobe. IMPRESSION: No active disease. Possible small lung nodule in the right upper lobe which may be evaluated with follow-up CT. Electronically Signed   By: Donavan Foil M.D.   On: 12/08/2018 23:08      Subjective: Appetite a little better  Discharge Exam: Vitals:   12/13/18 0534 12/13/18 1340  BP: (!) 147/78 138/82  Pulse: 82 73  Resp: 16 16  Temp: 99.2 F (37.3 C) 98.4 F (36.9 C)  SpO2: 94% 95%   Vitals:   12/12/18 2108 12/12/18 2330 12/13/18 0534 12/13/18 1340  BP: (!) 141/73  (!) 147/78 138/82  Pulse: 83  82 73  Resp: 16  16 16   Temp: 100.3 F (37.9 C) 100.3 F (37.9 C) 99.2 F (37.3 C) 98.4 F (36.9 C)  TempSrc: Oral Oral Oral Oral  SpO2: 94%  94% 95%  Weight:   72.7 kg   Height:        General: Pt is alert, awake, not in acute distress Cardiovascular: RRR, S1/S2 +, no rubs, no gallops Respiratory: CTA bilaterally, no wheezing, no rhonchi Abdominal: Soft,  NT, ND, bowel sounds + Extremities: no edema, no cyanosis    The results of significant diagnostics from this hospitalization (including imaging, microbiology, ancillary and laboratory) are listed below for reference.     Microbiology: Recent Results (from the past 240 hour(s))  SARS Coronavirus 2 [facility] order, Performed in [facility] hospital lab) Nasopharyngeal Nasopharyngeal Swab     Status: None   Collection Time: 12/08/18  8:15 PM   Specimen: Nasopharyngeal Swab  Result Value Ref Range Status   SARS Coronavirus 2 NEGATIVE NEGATIVE Final    Comment: (NOTE) If result is NEGATIVE SARS-CoV-2 target nucleic acids are NOT DETECTED. The SARS-CoV-2 RNA is generally detectable in upper and lower  respiratory specimens during the acute phase of infection. The lowest  concentration of SARS-CoV-2 viral copies this assay can detect is 250  copies / mL. A  negative result does not preclude SARS-CoV-2 infection  and should not be used as the sole basis for treatment or other  patient management decisions.  A negative result may occur with  improper specimen collection / handling, submission of specimen other  than nasopharyngeal swab, presence of viral mutation(s) within the  areas targeted by this assay, and inadequate number of viral copies  (<250 copies / mL). A negative result must be combined with clinical  observations, patient history, and epidemiological information. If result is POSITIVE SARS-CoV-2 target nucleic acids are DETECTED. The SARS-CoV-2 RNA is generally detectable in upper and lower  respiratory specimens dur ing the acute phase of infection.  Positive  results are indicative of active infection with SARS-CoV-2.  Clinical  correlation with patient history and other diagnostic information is  necessary to determine patient infection status.  Positive results do  not rule out bacterial infection or co-infection with other viruses. If result is PRESUMPTIVE POSTIVE SARS-CoV-2 nucleic acids MAY BE PRESENT.   A presumptive positive result was obtained on the submitted specimen  and confirmed on repeat testing.  While 2019 novel coronavirus  (SARS-CoV-2) nucleic acids may be present in the submitted sample  additional confirmatory testing may be necessary for epidemiological  and / or clinical management purposes  to differentiate between  SARS-CoV-2 and other Sarbecovirus currently known to infect humans.  If clinically indicated additional testing with an alternate test  methodology [phone]) is advised. The SARS-CoV-2 RNA is generally  detectable in upper and lower respiratory sp ecimens during the acute  phase of infection. The expected result is Negative. Fact Sheet for Patients:  StrictlyIdeas.no Fact Sheet for Healthcare Providers: BankingDealers.co.za This test is not  yet approved or cleared by the [place] FDA and has been authorized for detection and/or diagnosis of SARS-CoV-2 by FDA under an Emergency Use Authorization (EUA).  This EUA will remain in effect (meaning this test can be used) for the duration of the COVID-19 declaration under Section 564(b)(1) of the Act, 21 U.S.C. section 360bbb-3(b)(1), unless the authorization is terminated or revoked sooner. Performed at [facility], [address] [place], [place] [address]      Labs: BNP (last 3 results) No results for input(s): BNP in the last 8760 hours. Basic Metabolic Panel: Recent Labs  Lab 12/09/18 0258 12/10/18 0230 12/11/18 0243 12/12/18 0246 12/13/18 0231  NA 142 141 144 141 138  K 3.4* 3.8 3.6 3.6 3.4*  CL 109 112* 114* 112* 110  CO2 25 22 20* 20* 21*  GLUCOSE 138* 107* 113* 97 96  BUN 46* 43* 34* 33* 34*  CREATININE 2.76*  2.91* 2.72* 2.47* 2.39*  CALCIUM 12.4*   12.5* 11.6* 11.1* 10.1 9.5   Liver Function Tests: Recent Labs  Lab 12/08/18 1353 12/08/18 1743  AST  --  32  ALT  --  32  ALKPHOS  --  79  BILITOT  --  0.8  PROT 6.1 6.8  ALBUMIN  --  3.9   No results for input(s): LIPASE, AMYLASE in the last 168 hours. No results for input(s): AMMONIA in the last 168 hours. CBC: Recent Labs  Lab 12/08/18 1743 12/09/18 0258 12/10/18 0230  WBC 5.6 5.6 5.5  NEUTROABS 3.7  --   --   HGB 11.3* 10.0* 10.0*  HCT 35.7* 31.0* 31.1*  MCV 93.0 93.1 94.5  PLT 218 189 171   Cardiac Enzymes: No results for input(s): CKTOTAL, CKMB, CKMBINDEX, TROPONINI in the last 168 hours. BNP: Invalid input(s): POCBNP CBG: No results for input(s): GLUCAP in the last 168 hours. D-Dimer No results for input(s): DDIMER in the last 72 hours. Hgb A1c No results for input(s): HGBA1C in the last 72 hours. Lipid Profile No results for input(s): CHOL, HDL, LDLCALC, TRIG, CHOLHDL, LDLDIRECT in the last 72 hours. Thyroid function studies No results for input(s): TSH,  T4TOTAL, T3FREE, THYROIDAB in the last 72 hours.  Invalid input(s): FREET3 Anemia work up Recent Labs    12/11/18 0804  FERRITIN 507*  TIBC 258  IRON 28*   Urinalysis    Component Value Date/Time   COLORURINE YELLOW 12/10/2018 1600   APPEARANCEUR CLEAR 12/10/2018 1600   LABSPEC 1.009 12/10/2018 1600   PHURINE 6.0 12/10/2018 1600   GLUCOSEU NEGATIVE 12/10/2018 1600   GLUCOSEU NEGATIVE 11/24/2018 1507   HGBUR NEGATIVE 12/10/2018 1600   HGBUR negative 10/03/2009 0000   BILIRUBINUR NEGATIVE 12/10/2018 1600   KETONESUR NEGATIVE 12/10/2018 1600   PROTEINUR NEGATIVE 12/10/2018 1600   UROBILINOGEN 0.2 11/24/2018 1507   NITRITE NEGATIVE 12/10/2018 1600   LEUKOCYTESUR NEGATIVE 12/10/2018 1600   Sepsis Labs Invalid input(s): PROCALCITONIN,  WBC,  LACTICIDVEN Microbiology Recent Results (from the past 240 hour(s))  SARS Coronavirus 2 [facility] order, Performed in [facility] hospital lab) Nasopharyngeal Nasopharyngeal Swab     Status: None   Collection Time: 12/08/18  8:15 PM   Specimen: Nasopharyngeal Swab  Result Value Ref Range Status   SARS Coronavirus 2 NEGATIVE NEGATIVE Final    Comment: (NOTE) If result is NEGATIVE SARS-CoV-2 target nucleic acids are NOT DETECTED. The SARS-CoV-2 RNA is generally detectable in upper and lower  respiratory specimens during the acute phase of infection. The lowest  concentration of SARS-CoV-2 viral copies this assay can detect is 250  copies / mL. A negative result does not preclude SARS-CoV-2 infection  and should not be used as the sole basis for treatment or other  patient management decisions.  A negative result may occur with  improper specimen collection / handling, submission of specimen other  than nasopharyngeal swab, presence of viral mutation(s) within the  areas targeted by this assay, and inadequate number of viral copies  (<250 copies / mL). A negative result must be combined with clinical  observations, patient history, and  epidemiological information. If result is POSITIVE SARS-CoV-2 target nucleic acids are DETECTED. The SARS-CoV-2 RNA is generally detectable in upper and lower  respiratory specimens dur ing the acute phase of infection.  Positive  results are indicative of active infection with SARS-CoV-2.  Clinical  correlation with patient history and other diagnostic information is  necessary to determine patient infection status.  Positive results do  not rule out bacterial infection or co-infection with other viruses. If result is PRESUMPTIVE POSTIVE SARS-CoV-2 nucleic acids MAY BE PRESENT.   A presumptive positive result was obtained on the submitted specimen  and confirmed on repeat testing.  While 2019 novel coronavirus  (SARS-CoV-2) nucleic acids may be present in the submitted sample  additional confirmatory testing may be necessary for epidemiological  and / or clinical management purposes  to differentiate between  SARS-CoV-2 and other Sarbecovirus currently known to infect humans.  If clinically indicated additional testing with an alternate test  methodology [phone]) is advised. The SARS-CoV-2 RNA is generally  detectable in upper and lower respiratory sp ecimens during the acute  phase of infection. The expected result is Negative. Fact Sheet for Patients:  StrictlyIdeas.no Fact Sheet for Healthcare Providers: BankingDealers.co.za This test is not yet approved or cleared by the [place] FDA and has been authorized for detection and/or diagnosis of SARS-CoV-2 by FDA under an Emergency Use Authorization (EUA).  This EUA will remain in effect (meaning this test can be used) for the duration of the COVID-19 declaration under Section 564(b)(1) of the Act, 21 U.S.C. section 360bbb-3(b)(1), unless the authorization is terminated or revoked sooner. Performed at [facility], [address], [place], [place] [address]       Time coordinating discharge: 35 minutes  SIGNED:   Cordelia Poche, MD [facility] 12/13/2018, 4:18 PM

## 2018-12-13 NOTE — Discharge Instructions (Signed)
Ricardo Sloan,  You were in the hospital because of high calcium. This was likely the cause of your fatigue and GI symptoms. The cause of your high calcium is concerning for cancer, however, we could not make a definitive diagnosis during your time in the hospital. Your calcium was corrected with medication and fluids. If the reason for your high calcium persists and is not treated (ie, if there is cancer causing this), it is likely that your high calcium levels will return. I am recom

## 2018-12-13 NOTE — Progress Notes (Signed)
[facility] Progress Note  Subjective: Ca down and creat down slightly.    Vitals:   12/12/18 2108 12/12/18 2330 12/13/18 0534 12/13/18 1340  BP: (!) 141/73  (!) 147/78 138/82  Pulse: 83  82 73  Resp: 16  16 16   Temp: 100.3 F (37.9 C) 100.3 F (37.9 C) 99.2 F (37.3 C) 98.4 F (36.9 C)  TempSrc: Oral Oral Oral Oral  SpO2: 94%  94% 95%  Weight:   72.7 kg   Height:        Inpatient medications: . amLODipine  5 mg Oral Daily  . aspirin EC  81 mg Oral Daily  . docusate sodium  100 mg Oral BID  . heparin  5,000 Units Subcutaneous Q8H  . metoprolol succinate  25 mg Oral Daily  . pantoprazole  80 mg Oral Daily  . pravastatin  20 mg Oral q1800   . sodium chloride Stopped (12/12/18 1751)   acetaminophen **OR** acetaminophen, ondansetron **OR** ondansetron (ZOFRAN) IV, polyethylene glycol    Exam: Gen alert, HOH, no distress, elderly WM No rash, cyanosis or gangrene Sclera anicteric, throat clear and moist  No jvd or bruits, flat neck veins Chest clear bilat, no rales or wheezing RRR no MRG Abd soft ntnd no mass or ascites +bs GU normal male MS no joint effusions or deformity Ext trace -1+ bilat pretib edema, no wounds or ulcers Neuro is alert, Ox 3 , nf, no asterixis    Home meds:  - amlodipine 5 qd/ metoprolol xl 25 qd  - pravastatin 20 qd/ aspirin 81  - omeprazole 40 qd  - prn's/ vitamins/ supplements   UA on 11/24/18 > negative  UA 9/16 - negative  UNa 93, UCr 58   Renal US 13cm / 11 cm kidneys , ^echo and L sided hydronephrosis. Splenic mass, ? Hemangioma per report.    CXR 9/14 - no active, possible RUL nodule     Assessment/ Plan: 1. AKI on CKD3 - baseline creat 1.2- 1.4 from late 2019.  Hx HTN, no DM. UA negative and renal US normal except for mild L hydro.  Hypercalcemia suspected cause of AKI. CT abd/pelv did not show any pelvic mass or cause for L hydronephrosis. Renal function slowing improving now w/ correction of Ca++.  Creat  down 2.4 today.  We will contact patient after dc to set up renal f/u in [place] office. Will sign off.    2. Hypercalcemia - concerned for malignancy. PTH is low. SP calcitonin and has rec'd pamidronate x 1. Myeloma studies not impressive but small M-spike noted. Defer to ONC/ primary.  3. HTN - getting norvasc and BB here, BP's stable 4. HL    Rob Mable Dara 12/13/2018, 4:53 PM  Iron/TIBC/Ferritin/ %Sat    Component Value Date/Time   IRON 28 (L) 12/11/2018 0804   TIBC 258 12/11/2018 0804   FERRITIN 507 (H) 12/11/2018 0804   IRONPCTSAT 11 (L) 12/11/2018 0804   IRONPCTSAT 28 08/13/2017 0802   Recent Labs  Lab 12/08/18 1743  12/13/18 0231  NA 141   < > 138  K 4.0   < > 3.4*  CL 104   < > 110  CO2 26   < > 21*  GLUCOSE 110*   < > 96  BUN 49*   < > 34*  CREATININE 2.91*   < > 2.39*  CALCIUM 13.5*   < > 9.5  ALBUMIN 3.9  --   --    < > = values  in this interval not displayed.   Recent Labs  Lab 12/08/18 1743  AST 32  ALT 32  ALKPHOS 79  BILITOT 0.8  PROT 6.8   Recent Labs  Lab 12/10/18 0230  WBC 5.5  HGB 10.0*  HCT 31.1*  PLT 171

## 2018-12-13 NOTE — Consult Note (Signed)
Consultation Note Date: 12/13/2018   Patient Name: Ricardo Sloan  DOB: [birth year]  MRN: [id number]  Age / Sex: 85 y.o., male  PCP: Ricardo Maw, MD Referring Physician: Mariel Aloe, MD  Reason for Consultation: Establishing goals of care  HPI/Patient Profile: 85 y.o. male   admitted on 12/08/2018  Ricardo Sloan is a 85 y.o. malewith medical history significant ofHTN, RUL pulmonary nodule, CKD stage 3. Patient presented secondary to worsening hypercalcemia. He was started on IV fluids and given calcitonin, also seen by renal services in this hospitalization. The patient has underlying anemia and III CKD.  A palliative consult has been requested for assistance with ongoing goals of care discussions.   Clinical Assessment and Goals of Care:  Ricardo Sloan has just finished breakfast, he is resting in bed. He is in no distress, he is hard of hearing, how ever, he is able to fully understand and answers all questions appropriately.   Palliative medicine is specialized medical care for people living with serious illness. It focuses on providing relief from the symptoms and stress of a serious illness. The goal is to improve quality of life for both the patient and the family.  Goals of care: Broad aims of medical therapy in relation to the patient's values and preferences. Our aim is to provide medical care aimed at enabling patients to achieve the goals that matter most to them, given the circumstances of their particular medical situation and their constraints.   I asked Ricardo Sloan what is most important to him. He states that he is simply burned out, from having been the primary caregiver for his wife for the past several years. He is most thankful for his family son and grand daughter who support him as much as possible. He is worried about over burdening them. He states he was encouraged by the  way he was able to participate with PT, that he did venture out in the hallways and walks back and forth from his bed to bathroom as well. He feels 'fine' although admits to fatigue. Denies pain in a specific location.   Goals, wishes and values important to the patient attempted to be explored. He would like to be back home, is accepting of home health, PT and palliative at home. He says his wife will be happy to see him back home.   NEXT OF KIN  grand daughter and son.  Lives at home with wife, how ever, she is reportedly with multiple health challenges and requires continuous care for most all of her ADLs.  SUMMARY OF RECOMMENDATIONS    as per patient wishes, he wishes to go home, is accepting of home health and home based PT, is accepting of home based palliative follow up.  Would recommend patient follow up with medical oncology in the outpatient setting, would also recommend patient ask for palliative provider Ricardo Lessen NP consult at the cancer center. Continue current mode of care.   Code Status/Advance Care Planning:  Full code  Symptom Management:    as above   Palliative Prophylaxis:   Delirium Protocol  Additional Recommendations (Limitations, Scope, Preferences):  Full Scope Treatment  Psycho-social/Spiritual:   Desire for further Chaplaincy support:yes  Additional Recommendations: Caregiving  Support/Resources  Prognosis:   Unable to determine  Discharge Planning: Home with Home Health      Primary Diagnoses: Present on Admission: . Hypercalcemia . Essential hypertension . AKI (acute kidney injury) ([facility]) . Lung nodules . Anemia . GERD   I have reviewed the medical record, interviewed the patient and family, and examined the patient. The following aspects are pertinent.  Past Medical History:  Diagnosis Date  . BPH (benign prostatic hypertrophy)   . Colon polyps   . GERD (gastroesophageal reflux disease)    Upper endoscopy 2003, Dr.  Velora Heckler  . Hemorrhoids   . Hiatal hernia 2010  . Hyperlipidemia   . Hypertension   . Internal hemorrhoids   . Microscopic hematuria 2001   Dr.  Luanne Bras  . Pneumonia     X 2 in high school   Social History   Socioeconomic History  . Marital status: Married    Spouse name: Not on file  . Number of children: Not on file  . Years of education: Not on file  . Highest education level: Not on file  Occupational History  . Not on file  Social Needs  . Financial resource strain: Not on file  . Food insecurity    Worry: Not on file    Inability: Not on file  . Transportation needs    Medical: Not on file    Non-medical: Not on file  Tobacco Use  . Smoking status: Former Smoker    Packs/day: 0.75    Years: 13.00    Pack years: 9.75    Types: Cigarettes, Pipe    Quit date: 03/26/1965    Years since quitting: 53.7  . Smokeless tobacco: Never Used  . Tobacco comment: smoked 1953-1968, up to 1 ppd  Substance and Sexual Activity  . Alcohol use: Yes    Comment:  rarely socially; 1-2 / month  . Drug use: No  . Sexual activity: Not on file  Lifestyle  . Physical activity    Days per week: Not on file    Minutes per session: Not on file  . Stress: Not on file  Relationships  . Social Herbalist on phone: Not on file    Gets together: Not on file    Attends religious service: Not on file    Active member of club or organization: Not on file    Attends meetings of clubs or organizations: Not on file    Relationship status: Not on file  Other Topics Concern  . Not on file  Social History Narrative   Daily caffeine    Daily care of wife; becoming more disabled; but still cooks;    Stress 1-10; 2      epworth sleepiness scale = 10 (11/02/2015)   Fun/[place]: [place]   Denies abuse and feels safe at home.    Family History  Problem Relation Age of Onset  . Diabetes Mother   . Subarachnoid hemorrhage Daughter   . Lung cancer Brother        smoker  . Other  Paternal Grandfather        typhoid  . Heart disease Neg Hx   . Stroke Neg Hx   . Hypertension Neg Hx   . Colon  cancer Neg Hx    Scheduled Meds: . amLODipine  5 mg Oral Daily  . aspirin EC  81 mg Oral Daily  . docusate sodium  100 mg Oral BID  . heparin  5,000 Units Subcutaneous Q8H  . metoprolol succinate  25 mg Oral Daily  . pantoprazole  80 mg Oral Daily  . pravastatin  20 mg Oral q1800   Continuous Infusions: . sodium chloride Stopped (12/12/18 1751)   PRN Meds:.acetaminophen **OR** acetaminophen, ondansetron **OR** ondansetron (ZOFRAN) IV, polyethylene glycol Medications Prior to Admission:  Prior to Admission medications   Medication Sig Start Date End Date Taking? Authorizing Provider  amLODipine (NORVASC) 5 MG tablet Take 1 tablet (5 mg total) by mouth daily. 01/27/18  Yes Ricardo Maw, MD  Aspirin (ADULT ASPIRIN LOW STRENGTH) 81 MG EC tablet Take 81 mg by mouth daily.     Yes [provider]  loratadine (CLARITIN) 10 MG tablet Take 10 mg by mouth daily.     Yes [provider]  metoprolol succinate (TOPROL-XL) 25 MG 24 hr tablet Take 1 tablet (25 mg total) by mouth daily. 01/27/18  Yes Ricardo Maw, MD  Multiple Vitamin (MULTIVITAMIN) tablet Take 1 tablet by mouth daily.     Yes [provider]  omeprazole (PRILOSEC) 40 MG capsule Take 1 capsule (40 mg total) by mouth daily. 01/27/18  Yes Ricardo Maw, MD  polyethylene glycol powder [facility]) 17 GM/SCOOP powder Mix 1 scoop daily with water until constipation is relieved. 12/04/18  Yes Ricardo Maw, MD  pravastatin (PRAVACHOL) 40 MG tablet TAKE ONE-HALF TABLET BY MOUTH EVERY NIGHT AT BEDTIME 01/27/18  Yes Ricardo Maw, MD  vitamin B-12 (CYANOCOBALAMIN) 1000 MCG tablet Take 1,000 mcg by mouth daily.   Yes [provider]   No Known Allergies Review of Systems Denies pain Hard of hearing  Physical Exam Awake alert No distress S1 S2  Clear No edema Non focal Abdomen is not tender  Vital Signs: BP (!) 147/78 (BP Location: Right Arm)   Pulse 82   Temp 99.2 F (37.3 C) (Oral)   Resp 16   Ht 5\' 5"  (1.651 m)   Wt 72.7 kg   SpO2 94%   BMI 26.67 kg/m  Pain Scale: 0-10   Pain Score: 0-No pain   SpO2: SpO2: 94 % O2 Device:SpO2: 94 % O2 Flow Rate: .   IO: Intake/output summary:   Intake/Output Summary (Last 24 hours) at 12/13/2018 1058 Last data filed at 12/13/2018 0650 Gross per 24 hour  Intake 1225.55 ml  Output 925 ml  Net 300.55 ml    LBM: Last BM Date: 12/11/18 Baseline Weight: Weight: 70.3 kg Most recent weight: Weight: 72.7 kg     Palliative Assessment/Data:   PPS 40%  Time In:  9 Time Out:  10 Time Total:  60 Greater than 50%  of this time was spent counseling and coordinating care related to the above assessment and plan.  Signed by: Loistine Chance, MD  [id number] Please contact Palliative Medicine Team phone at [phone] for questions and concerns.  For individual provider: See Shea Evans

## 2018-12-14 LAB — PTH-RELATED PEPTIDE: PTH-related peptide: <2 pmol/L

## 2018-12-14 NOTE — Progress Notes (Signed)
Patient set up with [facility] for home health PT and OT.

## 2018-12-16 ENCOUNTER — Telehealth: Payer: Self-pay | Admitting: *Deleted

## 2018-12-16 NOTE — Telephone Encounter (Signed)
Transition Care Management Follow-up Telephone Call   Date discharged? 12/13/18   How have you been since you were released from the hospital? "Doing fine"   Do you understand why you were in the hospital? yes   Do you understand the discharge instructions? yes   Where were you discharged to? Home with family    Items Reviewed:  Medications reviewed: "no changes"  Allergies reviewed: "no changes"  Dietary changes reviewed: yes  Referrals reviewed: yes   Functional Questionnaire:   Activities of Daily Living (ADLs):   He states they are independent in the following: ambulation, bathing and hygiene, feeding, continence, grooming, toileting and dressing. Now using walker and urinal States they require assistance with the following: na   Any transportation issues/concerns?: no   Any patient concerns? no   Confirmed importance and date/time of follow-up visits scheduled yes  Provider Appointment booked with PCP 12/18/18  Confirmed with patient if condition begins to worsen call PCP or go to the ER.  Patient was given the office number and encouraged to call back with question or concerns.  : yes

## 2018-12-18 ENCOUNTER — Other Ambulatory Visit: Payer: Self-pay

## 2018-12-18 ENCOUNTER — Telehealth: Payer: Self-pay | Admitting: Family Medicine

## 2018-12-18 ENCOUNTER — Other Ambulatory Visit: Payer: Self-pay | Admitting: Family Medicine

## 2018-12-18 ENCOUNTER — Encounter: Payer: Self-pay | Admitting: Family Medicine

## 2018-12-18 ENCOUNTER — Ambulatory Visit (INDEPENDENT_AMBULATORY_CARE_PROVIDER_SITE_OTHER): Payer: Medicare HMO | Admitting: Family Medicine

## 2018-12-18 VITALS — BP 120/70 | HR 106 | Ht 65.0 in | Wt 161.0 lb

## 2018-12-18 DIAGNOSIS — R609 Edema, unspecified: Secondary | ICD-10-CM

## 2018-12-18 DIAGNOSIS — I10 Essential (primary) hypertension: Secondary | ICD-10-CM

## 2018-12-18 DIAGNOSIS — N179 Acute kidney failure, unspecified: Secondary | ICD-10-CM

## 2018-12-18 DIAGNOSIS — R918 Other nonspecific abnormal finding of lung field: Secondary | ICD-10-CM | POA: Diagnosis not present

## 2018-12-18 LAB — CBC
HCT: 32.7 % — ABNORMAL LOW (ref 39.0–52.0)
Hemoglobin: 11 g/dL — ABNORMAL LOW (ref 13.0–17.0)
MCHC: 33.7 g/dL (ref 30.0–36.0)
MCV: 90.4 fl (ref 78.0–100.0)
Platelets: 301 10*3/uL (ref 150.0–400.0)
RBC: 3.61 Mil/uL — ABNORMAL LOW (ref 4.22–5.81)
RDW: 14.5 % (ref 11.5–15.5)
WBC: 6.5 10*3/uL (ref 4.0–10.5)

## 2018-12-18 LAB — BASIC METABOLIC PANEL
BUN: 27 mg/dL — ABNORMAL HIGH (ref 6–23)
CO2: 25 mEq/L (ref 19–32)
Calcium: 10.4 mg/dL (ref 8.4–10.5)
Chloride: 106 mEq/L (ref 96–112)
Creatinine, Ser: 1.85 mg/dL — ABNORMAL HIGH (ref 0.40–1.50)
GFR: 34.88 mL/min — ABNORMAL LOW (ref >60.00)
Glucose, Bld: 147 mg/dL — ABNORMAL HIGH (ref 70–99)
Potassium: 3.6 mEq/L (ref 3.5–5.1)
Sodium: 140 mEq/L (ref 135–145)

## 2018-12-18 LAB — BRAIN NATRIURETIC PEPTIDE: Pro B Natriuretic peptide (BNP): 228 pg/mL — ABNORMAL HIGH (ref 0.0–100.0)

## 2018-12-18 MED ORDER — FUROSEMIDE 20 MG PO TABS: 20.0000 mg | ORAL_TABLET | Freq: Every day | ORAL | 0 refills | Status: DC

## 2018-12-18 MED ORDER — FUROSEMIDE 20 MG PO TABS: ORAL_TABLET | ORAL | 0 refills | Status: DC

## 2018-12-18 NOTE — Progress Notes (Addendum)
Established Patient Office Visit  Subjective:  Patient ID: Ricardo Sloan, male    DOB: September 18, 1931  Age: 85 y.o. MRN: [id number]  CC:  Chief Complaint  Patient presents with  . Follow-up  . Hospitalization Follow-up    HPI TAVARI COVALT presents for hospital follow-up for hypercalcemia and declining renal function.  Enlarging spiculated right upper lung mass was discovered.  Patient has follow-up appointment with nephrology in 3 weeks.  He was given calcitonin for hypercalcemia.  BMP drawn 5 days ago shows a calcium of 9.5 and a stable GFR at 24.  Heart size was noted to be normal by CT scan taken on the 17th.  He was vigorously resuscitated with fluids.  Patient's niece will be scheduling an appointment to see Dr. Irene Limbo for follow-up of his right upper lobe mass.  Past Medical History:  Diagnosis Date  . BPH (benign prostatic hypertrophy)   . Colon polyps   . GERD (gastroesophageal reflux disease)    Upper endoscopy 2003, Dr. Velora Heckler  . Hemorrhoids   . Hiatal hernia 2010  . Hyperlipidemia   . Hypertension   . Internal hemorrhoids   . Microscopic hematuria 2001   Dr.  Luanne Bras  . Pneumonia     X 2 in high school    Past Surgical History:  Procedure Laterality Date  . COLONOSCOPY  [id number]   Internal hemorrhoids, Dr. Velora Heckler  . NOSE SURGERY     Submucosal resection in the 1960s  . TONSILLECTOMY        . UPPER GASTROINTESTINAL ENDOSCOPY  2003   GERD  . WISDOM TOOTH EXTRACTION      Family History  Problem Relation Age of Onset  . Diabetes Mother   . Subarachnoid hemorrhage Daughter   . Lung cancer Brother        smoker  . Other Paternal Grandfather        typhoid  . Heart disease Neg Hx   . Stroke Neg Hx   . Hypertension Neg Hx   . Colon cancer Neg Hx     Social History   Socioeconomic History  . Marital status: Married    Spouse name: Not on file  . Number of children: Not on file  . Years of education: Not on file  . Highest education  level: Not on file  Occupational History  . Not on file  Social Needs  . Financial resource strain: Not on file  . Food insecurity    Worry: Not on file    Inability: Not on file  . Transportation needs    Medical: Not on file    Non-medical: Not on file  Tobacco Use  . Smoking status: Former Smoker    Packs/day: 0.75    Years: 13.00    Pack years: 9.75    Types: Cigarettes, Pipe    Quit date: 03/26/1965    Years since quitting: 53.7  . Smokeless tobacco: Never Used  . Tobacco comment: smoked 1953-1968, up to 1 ppd  Substance and Sexual Activity  . Alcohol use: Yes    Comment:  rarely socially; 1-2 / month  . Drug use: No  . Sexual activity: Not on file  Lifestyle  . Physical activity    Days per week: Not on file    Minutes per session: Not on file  . Stress: Not on file  Relationships  . Social Herbalist on phone: Not on file    Gets together: Not  on file    Attends religious service: Not on file    Active member of club or organization: Not on file    Attends meetings of clubs or organizations: Not on file    Relationship status: Not on file  . Intimate partner violence    Fear of current or ex partner: Not on file    Emotionally abused: Not on file    Physically abused: Not on file    Forced sexual activity: Not on file  Other Topics Concern  . Not on file  Social History Narrative   Daily caffeine    Daily care of wife; becoming more disabled; but still cooks;    Stress 1-10; 2      epworth sleepiness scale = 10 (11/02/2015)   Fun/[employer]: [employer]   Denies abuse and feels safe at home.     Outpatient Medications Prior to Visit  Medication Sig Dispense Refill  . amLODipine (NORVASC) 5 MG tablet Take 1 tablet (5 mg total) by mouth daily. 90 tablet 3  . Aspirin (ADULT ASPIRIN LOW STRENGTH) 81 MG EC tablet Take 81 mg by mouth daily.      Marland Kitchen loratadine (CLARITIN) 10 MG tablet Take 10 mg by mouth daily.      . metoprolol succinate (TOPROL-XL) 25 MG 24 hr  tablet Take 1 tablet (25 mg total) by mouth daily. 90 tablet 3  . Multiple Vitamin (MULTIVITAMIN) tablet Take 1 tablet by mouth daily.      Marland Kitchen omeprazole (PRILOSEC) 40 MG capsule Take 1 capsule (40 mg total) by mouth daily. 90 capsule 6  . ondansetron (ZOFRAN) 4 MG tablet Take 1 tablet (4 mg total) by mouth every 6 (six) hours as needed for nausea. 20 tablet 0  . polyethylene glycol powder (GLYCOLAX/MIRALAX) 17 GM/SCOOP powder Mix 1 scoop daily with water until constipation is relieved. 255 g 1  . pravastatin (PRAVACHOL) 40 MG tablet TAKE ONE-HALF TABLET BY MOUTH EVERY NIGHT AT BEDTIME 45 tablet 3  . vitamin B-12 (CYANOCOBALAMIN) 1000 MCG tablet Take 1,000 mcg by mouth daily.     No facility-administered medications prior to visit.     No Known Allergies  ROS Review of Systems  Constitutional: Negative for chills, diaphoresis, fatigue, fever and unexpected weight change.  Respiratory: Negative for cough, shortness of breath and wheezing.   Cardiovascular: Positive for leg swelling. Negative for chest pain and palpitations.  Gastrointestinal: Negative.   Endocrine: Negative for polyphagia and polyuria.  Genitourinary: Negative.   Musculoskeletal: Negative for gait problem and joint swelling.  Skin: Negative for pallor and rash.  Allergic/Immunologic: Negative for immunocompromised state.  Neurological: Negative for speech difficulty and light-headedness.  Hematological: Does not bruise/bleed easily.  Psychiatric/Behavioral: Negative.       Objective:    Physical Exam  Constitutional: He appears well-developed and well-nourished. No distress.  HENT:  Head: Normocephalic and atraumatic.  Right Ear: External ear normal.  Left Ear: External ear normal.  Eyes: Conjunctivae are normal. Right eye exhibits no discharge. Left eye exhibits no discharge. No scleral icterus.  Neck: No JVD present. No tracheal deviation present. No thyromegaly present.  Cardiovascular: Regular rhythm and  normal heart sounds. Tachycardia present.  Pulmonary/Chest: Effort normal. No stridor. He has no decreased breath sounds. He has no wheezes. He has no rhonchi. He has rales in the right lower field and the left lower field.  Musculoskeletal:     Right lower leg: Edema present.     Left lower leg: Edema present.  Lymphadenopathy:    He has no cervical adenopathy.  Skin: He is not diaphoretic.    BP 120/70   Pulse (!) 106   Ht 5\' 5"  (1.651 m)   Wt 161 lb (73 kg)   SpO2 99%   BMI 26.79 kg/m  Wt Readings from Last 3 Encounters:  12/18/18 161 lb (73 kg)  12/13/18 160 lb 4.4 oz (72.7 kg)  11/27/18 155 lb (70.3 kg)   BP Readings from Last 3 Encounters:  12/18/18 120/70  12/13/18 138/82  12/04/18 140/78   Guideline developer:  UpToDate (see UpToDate for funding source) Date Released: June 2014  There are no preventive care reminders to display for this patient.  There are no preventive care reminders to display for this patient.  Lab Results  Component Value Date   TSH 2.74 08/13/2017   Lab Results  Component Value Date   WBC 5.5 12/10/2018   HGB 10.0 (L) 12/10/2018   HCT 31.1 (L) 12/10/2018   MCV 94.5 12/10/2018   PLT 171 12/10/2018   Lab Results  Component Value Date   NA 138 12/13/2018   K 3.4 (L) 12/13/2018   CO2 21 (L) 12/13/2018   GLUCOSE 96 12/13/2018   BUN 34 (H) 12/13/2018   CREATININE 2.39 (H) 12/13/2018   BILITOT 0.8 12/08/2018   ALKPHOS 79 12/08/2018   AST 32 12/08/2018   ALT 32 12/08/2018   PROT 6.8 12/08/2018   ALBUMIN 3.9 12/08/2018   CALCIUM 9.5 12/13/2018   ANIONGAP 7 12/13/2018   GFR 21.80 (L) 12/04/2018   Lab Results  Component Value Date   CHOL 139 01/27/2018   Lab Results  Component Value Date   HDL 43.40 01/27/2018   Lab Results  Component Value Date   LDLCALC 80 01/27/2018   Lab Results  Component Value Date   TRIG 76.0 01/27/2018   Lab Results  Component Value Date   CHOLHDL 3 01/27/2018   Lab Results  Component  Value Date   HGBA1C 6.1 06/15/2014      Assessment & Plan:   Problem List Items Addressed This Visit      Cardiovascular and Mediastinum   Essential hypertension - Primary (Chronic)   Relevant Medications   furosemide (LASIX) 20 MG tablet   Other Relevant Orders   CBC     Respiratory   Mass of right lung     Genitourinary   AKI (acute kidney injury) ([place])   Relevant Orders   CBC   Basic metabolic panel     Other   Hypercalcemia   Relevant Orders   Basic metabolic panel   Edema   Relevant Medications   furosemide (LASIX) 20 MG tablet   Other Relevant Orders   B Nat Peptide      Meds ordered this encounter  Medications  . furosemide (LASIX) 20 MG tablet    Sig: Take 1 tablet (20 mg total) by mouth daily.    Dispense:  30 tablet    Refill:  0    Follow-up: Return in about 2 weeks (around 01/01/2019).   Am treating his rails and lower extremity edema with 20 mg of Lasix daily.  We will add potassium pending today's BMP.  Follow-up will be in 2 weeks.

## 2018-12-18 NOTE — Telephone Encounter (Signed)
Pt no longer using [facility] and does not want to call to have medication switch to [facility] and would like office to call and have furosemide send to [address]. Pt seen dr Ethelene Hal today

## 2018-12-19 ENCOUNTER — Telehealth: Payer: Self-pay | Admitting: Hematology

## 2018-12-19 NOTE — Telephone Encounter (Signed)
I received a call from Mr. Duell's granddaughter, Junie Panning to schedule an appt for him to see Dr. Irene Limbo. Mr. Vander has been scheduled to see Dr. Irene Limbo on 9/29 at 1pm. Ms. Mervyn Gay has been made aware for her grandfather to arrive 59 minutes early.

## 2018-12-22 NOTE — Progress Notes (Signed)
HEMATOLOGY/ONCOLOGY CONSULTATION NOTE  Date of Service: 12/22/2018  Patient Care Team: Libby Maw, MD as PCP - General (Family Medicine) Irene Shipper, MD as Consulting Physician (Gastroenterology) Darlin Coco, MD as Consulting Physician (Cardiology)  CHIEF COMPLAINTS/PURPOSE OF CONSULTATION:  Concern for occult cancer  HISTORY OF PRESENTING ILLNESS:   Ricardo Sloan is a wonderful 85 y.o. male who is here for evaluation and management of concern for occult cancer. Pt is accompanied today by his son Ricardo Sloan, via phone. The pt reports that he is doing well overall.   The pt reports that he began to feel differently about a month ago. He was experiencing fatigue and weakness in his legs. He went to see his PCP who did some blood work. After which he was admitted to the hospital on 09/17 and diagnosed with Hypercalcemia. Since leaving the hospital his abdomen has been sore and distended. Pt states that every few minutes he has a sharp pain in his lower left abdomen that has gotten significantly worse since yesterday. Pt denies any back pain or radiating pains. He has noticed some "stringy" bowel movements lately but his last bowel movement was normal. His PCP placed him on Metamucil but he does not feel that it helps him. He does note some bloating and trapped gas in his stomach. These symptoms are new since his hospital discharge.    He denies any changes in breathing, SOB or chest pains. He has not received any information on the possible causes for his lung nodules. Ricardo Sloan states that pt's PCP was concerned about the fluid in pt's lungs and they did some testing that was not conclusive. Pt has been on a diuretic since he saw his PCP last week, which has helped alleviate his leg swelling. Pt denies any kidney problems before his hospitalization or issues with urination. He has had no new bone pain. Pt has been taking multivitamin daily and has not been on any antibiotics  lately. He was not given any pain medication upon discharge only tablets for nausea. Pt reports decreased appetite but has not lost any weight in recent months. He has not travelled outside of [place], has not been exposed to anyone with a pulmonary infection or anyone who is from a place with a high rate of fungal infections.   Pt has an upcoming appointment with his Nephrologist Dr. [name] at [facility] in about 2 weeks. He does not have an appointement with a Urologist.    Of note prior to the patient's visit today, had a whole Body bone scan ([id number]) completed on 12/12/2018 with results revealing "1. No focal abnormality over the right clavicle to account for patient's pain. 2. Two adjacent foci of increased tracer activity over the right third and fourth anterior rib ends likely posttraumatic as metastatic disease is much less likely. 3. Degenerative changes of the appendicular skeleton. 4. Left hydronephrosis."   Pt has had CT C/A/P ([id number]) ([id number]) completed on 09/17/020 with results revealing "CT of the chest: Scattered nodular densities bilaterally which have increased in the interval from the prior exam particularly in the right upper lobe where a 10 mm mildly spiculated nodule is seen as described. Small pleural effusions are noted as well. Outpatient PET-CT may be helpful for further evaluation. Soft tissue densities are noted along the left lateral aspect of the sternum stable in appearance from the prior exam and likely benign in etiology. CT of the abdomen and pelvis: Mixed attenuation mass lesion within the  spleen as described. This is larger than seen on prior examination and may simply represent an enlarged hemangioma. The evaluation is somewhat limited due to lack of IV contrast. More aggressive process could not be totally excluded. Left renal calculi with evidence of left-sided hydronephrosis. This is new from the prior exam but similar to that seen on recent  ultrasound. No obstructing lesion or stone is noted. Mild ascites."  Pt has had Renal US ([id number]) completed on 12/10/2018 with results revealing "Left-sided hydronephrosis. Bilateral effusions and mild ascites. Increased echogenicity in the right kidney is noted consistent with medical renal disease. Mild increased echogenicity is noted on the left. Findings suspicious for a mass within the spleen. This may simply represent a large hemangioma. Contrast enhanced CT or MRI may be helpful for further evaluation."  Most recent lab results (12/18/2018) of CBC & BMP is as follows: WBC at 6.5K, RBC at 3.61, PLTs at 301.0K, Hgb at 11.0, HCT at 32.7, MCV at 90.4, MCHC at 33.7, RDW at 14.5, Sodium at 140, Potassium at 3.6, Chloride at 106, CO2 at 25, Glucose at 147, BUN at 27, Creatinine at 1.85, Calcium at 10.4, GFR at 34.88.   On review of systems, pt reports nausea, lower left abdominal pain, bowel movement changes, improved leg swelling and denies back pain, new bone pain, breathing changes, SOB, chest pains, unexpected weight loss and any other symptoms.   On PMHx the pt reports HTN, HLD.  MEDICAL HISTORY:  Past Medical History:  Diagnosis Date   BPH (benign prostatic hypertrophy)    Colon polyps    GERD (gastroesophageal reflux disease)    Upper endoscopy 2003, Dr. Velora Heckler   Hemorrhoids    Hiatal hernia 2010   Hyperlipidemia    Hypertension    Internal hemorrhoids    Microscopic hematuria 2001   Dr.  Sherrye Payor Kimbrough   Pneumonia     X 2 in high school    SURGICAL HISTORY: Past Surgical History:  Procedure Laterality Date   COLONOSCOPY  2001,2006   Internal hemorrhoids, Dr. Velora Heckler   NOSE SURGERY     Submucosal resection in the [facility]  2003   GERD   WISDOM TOOTH EXTRACTION      SOCIAL HISTORY: Social History   Socioeconomic History   Marital status: Married    Spouse name: Not on file   Number of  children: Not on file   Years of education: Not on file   Highest education level: Not on file  Occupational History   Not on file  Social Needs   Financial resource strain: Not on file   Food insecurity    Worry: Not on file    Inability: Not on file   Transportation needs    Medical: Not on file    Non-medical: Not on file  Tobacco Use   Smoking status: Former Smoker    Packs/day: 0.75    Years: 13.00    Pack years: 9.75    Types: Cigarettes, Pipe    Quit date: 03/26/1965    Years since quitting: 53.7   Smokeless tobacco: Never Used   Tobacco comment: smoked 1953-1968, up to 1 ppd  Substance and Sexual Activity   Alcohol use: Yes    Comment:  rarely socially; 1-2 / month   Drug use: No   Sexual activity: Not on file  Lifestyle   Physical activity    Days per week: Not  on file    Minutes per session: Not on file   Stress: Not on file  Relationships   Social connections    Talks on phone: Not on file    Gets together: Not on file    Attends religious service: Not on file    Active member of club or organization: Not on file    Attends meetings of clubs or organizations: Not on file    Relationship status: Not on file   Intimate partner violence    Fear of current or ex partner: Not on file    Emotionally abused: Not on file    Physically abused: Not on file    Forced sexual activity: Not on file  Other Topics Concern   Not on file  Social History Narrative   Daily caffeine    Daily care of wife; becoming more disabled; but still cooks;    Stress 1-10; 2      epworth sleepiness scale = 10 (11/02/2015)   Fun/[employer]: Garden   Denies abuse and feels safe at home.     FAMILY HISTORY: Family History  Problem Relation Age of Onset   Diabetes Mother    Subarachnoid hemorrhage Daughter    Lung cancer Brother        smoker   Other Paternal Grandfather        typhoid   Heart disease Neg Hx    Stroke Neg Hx    Hypertension Neg Hx    Colon  cancer Neg Hx     ALLERGIES:  has No Known Allergies.  MEDICATIONS:  Current Outpatient Medications  Medication Sig Dispense Refill   amLODipine (NORVASC) 5 MG tablet Take 1 tablet (5 mg total) by mouth daily. 90 tablet 3   Aspirin (ADULT ASPIRIN LOW STRENGTH) 81 MG EC tablet Take 81 mg by mouth daily.       furosemide (LASIX) 20 MG tablet TAKE 1 TABLET(20 MG) BY MOUTH DAILY 90 tablet 0   loratadine (CLARITIN) 10 MG tablet Take 10 mg by mouth daily.       metoprolol succinate (TOPROL-XL) 25 MG 24 hr tablet Take 1 tablet (25 mg total) by mouth daily. 90 tablet 3   Multiple Vitamin (MULTIVITAMIN) tablet Take 1 tablet by mouth daily.       omeprazole (PRILOSEC) 40 MG capsule Take 1 capsule (40 mg total) by mouth daily. 90 capsule 6   ondansetron (ZOFRAN) 4 MG tablet Take 1 tablet (4 mg total) by mouth every 6 (six) hours as needed for nausea. 20 tablet 0   polyethylene glycol powder (GLYCOLAX/MIRALAX) 17 GM/SCOOP powder Mix 1 scoop daily with water until constipation is relieved. 255 g 1   pravastatin (PRAVACHOL) 40 MG tablet TAKE ONE-HALF TABLET BY MOUTH EVERY NIGHT AT BEDTIME 45 tablet 3   vitamin B-12 (CYANOCOBALAMIN) 1000 MCG tablet Take 1,000 mcg by mouth daily.     No current facility-administered medications for this visit.     REVIEW OF SYSTEMS:    10 Point review of Systems was done is negative except as noted above.  PHYSICAL EXAMINATION: ECOG PERFORMANCE STATUS: 2 - Symptomatic, <50% confined to bed  . Vitals:   12/23/18 1316  BP: 136/87  Pulse: (!) 109  Resp: 18  Temp: 98 F (36.7 C)  SpO2: 98%   Filed Weights   12/23/18 1316  Weight: 157 lb (71.2 kg)   .Body mass index is 26.13 kg/m.  GENERAL:alert, in no acute distress and comfortable SKIN: no acute rashes,  no significant lesions EYES: conjunctiva are pink and non-injected, sclera anicteric OROPHARYNX: MMM, no exudates, no oropharyngeal erythema or ulceration NECK: supple, no JVD LYMPH:  no  palpable lymphadenopathy in the cervical, axillary or inguinal regions LUNGS: clear to auscultation b/l with normal respiratory effort HEART: regular rate & rhythm ABDOMEN: lower abdominal tenderness to palpation especially LLQ with rebounding. Decreased bowel sounds.  Extremity: no pedal edema PSYCH: alert & oriented x 3 with fluent speech NEURO: no focal motor/sensory deficits  LABORATORY DATA:  I have reviewed the data as listed  . CBC Latest Ref Rng & Units 12/18/2018 12/10/2018 12/09/2018  WBC 4.0 - 10.5 K/uL 6.5 5.5 5.6  Hemoglobin 13.0 - 17.0 g/dL 11.0(L) 10.0(L) 10.0(L)  Hematocrit 39.0 - 52.0 % 32.7(L) 31.1(L) 31.0(L)  Platelets 150.0 - 400.0 K/uL 301.0 171 189    . CMP Latest Ref Rng & Units 12/18/2018 12/13/2018 12/12/2018  Glucose 70 - 99 mg/dL 147(H) 96 97  BUN 6 - 23 mg/dL 27(H) 34(H) 33(H)  Creatinine 0.40 - 1.50 mg/dL 1.85(H) 2.39(H) 2.47(H)  Sodium 135 - 145 mEq/L 140 138 141  Potassium 3.5 - 5.1 mEq/L 3.6 3.4(L) 3.6  Chloride 96 - 112 mEq/L 106 110 112(H)  CO2 19 - 32 mEq/L 25 21(L) 20(L)  Calcium 8.4 - 10.5 mg/dL 10.4 9.5 10.1  Total Protein 6.5 - 8.1 g/dL - - -  Total Bilirubin 0.3 - 1.2 mg/dL - - -  Alkaline Phos 38 - 126 U/L - - -  AST 15 - 41 U/L - - -  ALT 0 - 44 U/L - - -   12/18/2018 & 12/23/2018 CBC  Component     Latest Ref Rng & Units 12/18/2018 12/23/2018  WBC     4.0 - 10.5 K/uL 6.5 13.8 (H)  RBC     4.22 - 5.81 MIL/uL 3.61 (L) 3.99 (L)  Hemoglobin     13.0 - 17.0 g/dL 11.0 (L) 12.0 (L)  HCT     39.0 - 52.0 % 32.7 (L) 36.9 (L)  MCV     80.0 - 100.0 fL 90.4 92.5  MCH     26.0 - 34.0 pg  30.1  MCHC     30.0 - 36.0 g/dL 33.7 32.5  RDW     11.5 - 15.5 % 14.5 14.6  Platelets     150 - 400 K/uL 301.0 369  nRBC     0.0 - 0.2 %  0.0  Neutrophils     %  90  NEUT#     1.7 - 7.7 K/uL  12.2 (H)  Lymphocytes     %  4  Lymphocyte #     0.7 - 4.0 K/uL  0.6 (L)  Monocytes Relative     %  6  Monocyte #     0.1 - 1.0 K/uL  0.9  Eosinophil      %  0  Eosinophils Absolute     0.0 - 0.5 K/uL  0.1  Basophil     %  0  Basophils Absolute     0.0 - 0.1 K/uL  0.1  Immature Granulocytes     %  0  Abs Immature Granulocytes     0.00 - 0.07 K/uL  0.05   12/18/2018 & 12/23/2018 BMP Component     Latest Ref Rng & Units 12/18/2018 12/23/2018  Sodium     135 - 145 mmol/L 140 136  Potassium     3.5 - 5.1 mmol/L 3.6 3.7  Chloride     98 - 111 mmol/L 106 100  CO2     22 - 32 mmol/L 25 23  Glucose     70 - 99 mg/dL 147 (H) 125 (H)  BUN     8 - 23 mg/dL 27 (H) 29 (H)  Creatinine     0.61 - 1.24 mg/dL 1.85 (H) 2.19 (H)  Calcium     8.9 - 10.3 mg/dL 10.4 9.2  GFR, Est Non African American     >60 mL/min  26 (L)  GFR, Est African American     >60 mL/min  31 (L)  Anion gap     5 - 15  13   12/09/2018 Protein electrophoresis serum  Component     Latest Ref Rng & Units 12/09/2018  Total Protein ELP     6.0 - 8.5 g/dL 4.9 (L)  Albumin ELP     2.9 - 4.4 g/dL 3.0  Alpha-1-Globulin     0.0 - 0.4 g/dL 0.2  Alpha-2-Globulin     0.4 - 1.0 g/dL 0.7  Beta Globulin     0.7 - 1.3 g/dL 0.6 (L)  Gamma Globulin     0.4 - 1.8 g/dL 0.3 (L)  M-SPIKE, %     Not Observed g/dL Not Observed  SPE Interp.      Comment  Comment      Comment  Globulin, Total     2.2 - 3.9 g/dL 1.9 (L)  A/G Ratio     0.7 - 1.7 1.6   12/09/2018 PTHP    12/09/2018 PTH & Total Calcium    12/11/2018 Vit D 25 Hydroxy     RADIOGRAPHIC STUDIES: I have personally reviewed the radiological images as listed and agreed with the findings in the report. Ct Abdomen Pelvis Wo Contrast  Result Date: 12/11/2018 CLINICAL DATA:  Known left-sided hydronephrosis and chronic renal disease EXAM: CT CHEST, ABDOMEN AND PELVIS WITHOUT CONTRAST TECHNIQUE: Multidetector CT imaging of the chest, abdomen and pelvis was performed following the standard protocol without IV contrast. COMPARISON:  04/10/2017 FINDINGS: CT CHEST FINDINGS Cardiovascular: Thoracic aorta demonstrates  atherosclerotic calcifications without aneurysmal dilatation. Coronary calcifications are noted. No cardiac enlargement is seen. Mediastinum/Nodes: Thoracic inlet is within normal limits. No hilar or mediastinal adenopathy is noted. The esophagus as visualized is within normal limits. Lungs/Pleura: Lungs are well aerated bilaterally with mild emphysematous changes. Small bilateral pleural effusions are seen left greater than right. Mild left lower lobe atelectasis is noted. Scarring in the right middle lobe and lingula on the left are again seen and stable. Multiple nodules are noted throughout both lungs increased in appearance when compared with the prior exam. This is most notable in the right upper lobe where a 10 mm nodule is noted best seen on image number 57 of series 4. Scattered smaller nodules are seen. Previously noted ground-glass attenuation area in the posterior right upper lobe has improved but demonstrates some residual nodular changes. The remainder of the nodular densities measure less than 5 mm. Musculoskeletal: Degenerative changes of the thoracic spine are noted. No lytic or sclerotic lesions are seen. Extrapleural soft tissue densities are noted along the left lateral aspect of the sternum stable in appearance from the prior exam from 2019 again likely benign in etiology. CT ABDOMEN PELVIS FINDINGS Hepatobiliary: Somewhat limited due to lack of IV contrast. Stable hepatic cyst is noted. The gallbladder is within normal limits. Pancreas: Atrophic changes of the pancreas are seen. Spleen: Spleen is enlarged in  size. Evaluation is somewhat limited due to lack of IV contrast. A mixed attenuation lesion is identified which measures at least 10 cm in greatest dimension. This would correspond with that seen on prior ultrasound examination. In retrospect, this was likely present on the prior exam of 2019 but again incompletely evaluated because of lack of IV contrast. Previously it measured just over 5  cm. Adrenals/Urinary Tract: Adrenal glands are within normal limits. The right kidney demonstrates no focal abnormality. The left kidney demonstrates some renal calculi. Left-sided hydronephrosis is seen although the ureter appears to be within normal limits. No ureteral calculi are noted. The bladder is well distended. Stomach/Bowel: The appendix is within normal limits. Colon is filled with fecal material although no obstructive changes are seen. Small bowel and stomach appear within normal limits. Vascular/Lymphatic: Aortic atherosclerosis. No enlarged abdominal or pelvic lymph nodes. Reproductive: Prostate is unremarkable. Other: Free fluid is noted within the pelvis. Free fluid also surrounds the liver. Musculoskeletal: Degenerative changes of the lumbar spine are seen. No lytic or sclerotic lesions are noted. IMPRESSION: CT of the chest: Scattered nodular densities bilaterally which have increased in the interval from the prior exam particularly in the right upper lobe where a 10 mm mildly spiculated nodule is seen as described. Small pleural effusions are noted as well. Outpatient PET-CT may be helpful for further evaluation. Soft tissue densities are noted along the left lateral aspect of the sternum stable in appearance from the prior exam and likely benign in etiology. CT of the abdomen and pelvis: Mixed attenuation mass lesion within the spleen as described. This is larger than seen on prior examination and may simply represent an enlarged hemangioma. The evaluation is somewhat limited due to lack of IV contrast. More aggressive process could not be totally excluded. Left renal calculi with evidence of left-sided hydronephrosis. This is new from the prior exam but similar to that seen on recent ultrasound. No obstructing lesion or stone is noted. Mild ascites. Electronically Signed   By: Inez Catalina M.D.   On: 12/11/2018 19:52   Dg Ribs Unilateral W/chest Right  Result Date: 12/10/2018 CLINICAL DATA:   Chest pain. EXAM: RIGHT RIBS AND CHEST - 3+ VIEW COMPARISON:  Radiographs of December 08, 2018. FINDINGS: No fracture or other bone lesions are seen involving the ribs. There is no evidence of pneumothorax or pleural effusion. As noted on recent x-ray, nodular density is seen in right upper lobe. Heart size and mediastinal contours are within normal limits. IMPRESSION: Normal right ribs. No pneumothorax or pleural effusion is noted. As noted on recent x-ray, nodular density is noted in right upper lobe, and CT scan of the chest is recommended to rule out pulmonary nodule or mass. Electronically Signed   By: Marijo Conception M.D.   On: 12/10/2018 14:54   Ct Chest Wo Contrast  Result Date: 12/11/2018 CLINICAL DATA:  Known left-sided hydronephrosis and chronic renal disease EXAM: CT CHEST, ABDOMEN AND PELVIS WITHOUT CONTRAST TECHNIQUE: Multidetector CT imaging of the chest, abdomen and pelvis was performed following the standard protocol without IV contrast. COMPARISON:  04/10/2017 FINDINGS: CT CHEST FINDINGS Cardiovascular: Thoracic aorta demonstrates atherosclerotic calcifications without aneurysmal dilatation. Coronary calcifications are noted. No cardiac enlargement is seen. Mediastinum/Nodes: Thoracic inlet is within normal limits. No hilar or mediastinal adenopathy is noted. The esophagus as visualized is within normal limits. Lungs/Pleura: Lungs are well aerated bilaterally with mild emphysematous changes. Small bilateral pleural effusions are seen left greater than right. Mild left lower lobe  atelectasis is noted. Scarring in the right middle lobe and lingula on the left are again seen and stable. Multiple nodules are noted throughout both lungs increased in appearance when compared with the prior exam. This is most notable in the right upper lobe where a 10 mm nodule is noted best seen on image number 57 of series 4. Scattered smaller nodules are seen. Previously noted ground-glass attenuation area in the  posterior right upper lobe has improved but demonstrates some residual nodular changes. The remainder of the nodular densities measure less than 5 mm. Musculoskeletal: Degenerative changes of the thoracic spine are noted. No lytic or sclerotic lesions are seen. Extrapleural soft tissue densities are noted along the left lateral aspect of the sternum stable in appearance from the prior exam from 2019 again likely benign in etiology. CT ABDOMEN PELVIS FINDINGS Hepatobiliary: Somewhat limited due to lack of IV contrast. Stable hepatic cyst is noted. The gallbladder is within normal limits. Pancreas: Atrophic changes of the pancreas are seen. Spleen: Spleen is enlarged in size. Evaluation is somewhat limited due to lack of IV contrast. A mixed attenuation lesion is identified which measures at least 10 cm in greatest dimension. This would correspond with that seen on prior ultrasound examination. In retrospect, this was likely present on the prior exam of 2019 but again incompletely evaluated because of lack of IV contrast. Previously it measured just over 5 cm. Adrenals/Urinary Tract: Adrenal glands are within normal limits. The right kidney demonstrates no focal abnormality. The left kidney demonstrates some renal calculi. Left-sided hydronephrosis is seen although the ureter appears to be within normal limits. No ureteral calculi are noted. The bladder is well distended. Stomach/Bowel: The appendix is within normal limits. Colon is filled with fecal material although no obstructive changes are seen. Small bowel and stomach appear within normal limits. Vascular/Lymphatic: Aortic atherosclerosis. No enlarged abdominal or pelvic lymph nodes. Reproductive: Prostate is unremarkable. Other: Free fluid is noted within the pelvis. Free fluid also surrounds the liver. Musculoskeletal: Degenerative changes of the lumbar spine are seen. No lytic or sclerotic lesions are noted. IMPRESSION: CT of the chest: Scattered nodular  densities bilaterally which have increased in the interval from the prior exam particularly in the right upper lobe where a 10 mm mildly spiculated nodule is seen as described. Small pleural effusions are noted as well. Outpatient PET-CT may be helpful for further evaluation. Soft tissue densities are noted along the left lateral aspect of the sternum stable in appearance from the prior exam and likely benign in etiology. CT of the abdomen and pelvis: Mixed attenuation mass lesion within the spleen as described. This is larger than seen on prior examination and may simply represent an enlarged hemangioma. The evaluation is somewhat limited due to lack of IV contrast. More aggressive process could not be totally excluded. Left renal calculi with evidence of left-sided hydronephrosis. This is new from the prior exam but similar to that seen on recent ultrasound. No obstructing lesion or stone is noted. Mild ascites. Electronically Signed   By: Inez Catalina M.D.   On: 12/11/2018 19:52   Nm Bone Scan Whole Body  Result Date: 12/12/2018 CLINICAL DATA:  Abdominal neoplasm. Concern for MM. Current pain right clavicle. EXAM: NUCLEAR MEDICINE WHOLE BODY BONE SCAN TECHNIQUE: Whole body anterior and posterior images were obtained approximately 3 hours after intravenous injection of radiopharmaceutical. RADIOPHARMACEUTICALS:  21.2 mCi Technetium-69m MDP IV COMPARISON:  CT 12/11/2018 FINDINGS: Exam demonstrates 2 adjacent foci of increased radiotracer activity over the  right anterior third and fourth rib ends likely posttraumatic. Small focus of increased tracer activity over the soft tissues just above the left hip lateral to the iliac bone seen only on the anterior images without corresponding abnormality on CT as this may be artifactual. No focal abnormality over the right clavicle to explain patient's right clavicular pain. Minimal symmetric uptake over the shoulders, elbows, knees and ankles compatible with degenerative  changes. Increased tracer activity over dilated left intrarenal collecting system/renal pelvis as seen on CT. IMPRESSION: 1. No focal abnormality over the right clavicle to account for patient's pain. 2. Two adjacent foci of increased tracer activity over the right third and fourth anterior rib ends likely posttraumatic as metastatic disease is much less likely. 3.  Degenerative changes of the appendicular skeleton. 4.  Left hydronephrosis. Electronically Signed   By: Marin Olp M.D.   On: 12/12/2018 16:21   US Renal  Result Date: 12/10/2018 CLINICAL DATA:  Acute renal injury with left flank pain EXAM: RENAL / URINARY TRACT ULTRASOUND COMPLETE COMPARISON:  None. FINDINGS: Right Kidney: Renal measurements: 11.9 x 4.3 x 4.7 cm. = volume: 125 mL. Diffuse increased echogenicity is noted. No mass lesion or hydronephrosis is noted. Left Kidney: Renal measurements: 13.6 x 6.2 x 7.6 cm. = volume: 429 mL. Moderate hydronephrosis is seen. Mild increased echogenicity is noted. Bladder: Bladder is well distended. Bilateral pleural effusions and mild ascites is seen. Spleen is mildly prominent with some diffuse heterogeneity within the central portion suggestive of underlying mass. Contrast enhanced CT may be helpful. Alternatively MRI on a nonemergent basis could be performed. IMPRESSION: Left-sided hydronephrosis. Bilateral effusions and mild ascites. Increased echogenicity in the right kidney is noted consistent with medical renal disease. Mild increased echogenicity is noted on the left. Findings suspicious for a mass within the spleen. This may simply represent a large hemangioma. Contrast enhanced CT or MRI may be helpful for further evaluation. Electronically Signed   By: Inez Catalina M.D.   On: 12/10/2018 20:08   Dg Chest Port 1 View  Result Date: 12/08/2018 CLINICAL DATA:  Hypercalcemia workup, weakness EXAM: PORTABLE CHEST 1 VIEW COMPARISON:  04/25/2017, CT chest 04/10/2017 FINDINGS: No focal opacity or  pleural effusion. Normal cardiomediastinal silhouette with aortic atherosclerosis. No pneumothorax. Possible small lung nodule in the right upper lobe. IMPRESSION: No active disease. Possible small lung nodule in the right upper lobe which may be evaluated with follow-up CT. Electronically Signed   By: Donavan Foil M.D.   On: 12/08/2018 23:08    ASSESSMENT & PLAN:   1) hypercalcemia related to elevated 1,25 OH VitD -- concerning for NHL 2) Splenic mass, retroperitoneal lymphadenopathy and multiple pulmonary nodules -- likely concerning for lymphoma . Less likely granulomatous inflammation 3) left sided hydronephrosis - unclear etiology PLAN:  -Discussed patient's most recent labs from 12/18/2018, WBC at 6.5K, RBC at 3.61, PLTs at 301.0K, Hgb at 11.0, HCT at 32.7, MCV at 90.4, MCHC at 33.7, RDW at 14.5, Sodium at 140, Potassium at 3.6, Chloride at 106, CO2 at 25, Glucose at 147, BUN at 27, Creatinine at 1.85, Calcium at 10.4, GFR at 34.88.  -Discussed 12/12/2018 PET Scan Whole Body ([id number]) which revealed "1. No focal abnormality over the right clavicle to account for patient's pain. 2. Two adjacent foci of increased tracer activity over the right third and fourth anterior rib ends likely posttraumatic as metastatic disease is much less likely. 3. Degenerative changes of the appendicular skeleton. 4. Left hydronephrosis."  -Discussed 09/17/020 CT  C/A/P ([id number]) ([id number]) which revealed "CT of the chest: Scattered nodular densities bilaterally which have increased in the interval from the prior exam particularly in the right upper lobe where a 10 mm mildly spiculated nodule is seen as described. Small pleural effusions are noted as well. Outpatient PET-CT may be helpful for further evaluation. Soft tissue densities are noted along the left lateral aspect of the sternum stable in appearance from the prior exam and likely benign in etiology. CT of the abdomen and pelvis: Mixed attenuation mass  lesion within the spleen as described. This is larger than seen on prior examination and may simply represent an enlarged hemangioma. The evaluation is somewhat limited due to lack of IV contrast. More aggressive process could not be totally excluded. Left renal calculi with evidence of left-sided hydronephrosis. This is new from the prior exam but similar to that seen on recent ultrasound. No obstructing lesion or stone is noted. Mild ascites." -Discussed 12/10/2018 Renal US ([id number]) which revealed "Left-sided hydronephrosis. Bilateral effusions and mild ascites. Increased echogenicity in the right kidney is noted consistent with medical renal disease. Mild increased echogenicity is noted on the left. Findings suspicious for a mass within the spleen. This may simply represent a large hemangioma. Contrast enhanced CT or MRI may be helpful for further evaluation." -Discussed that pt's Hypercalcemia could be due to MM, an inflammatory disorder or an immune disorder -Advised that bleeding concerns make it very difficult to do a biopsy of the splenic mass. -Advised that it is also difficult to biopsy lung nodules -Recommended that the pt drink at least 48-64 oz of water each day, limit about of potassium in diet -Will monitor calcium levels -Will refer to a Urologist   -Will order whole body PET/CT scan within a week to direct diagnostic workup -Will get labs today  -Pt has significant LLQ tenderness and rebound-- sent to ED for stat imaging and mx  -Will send to the ER for peritoneal signs  -Advised pt needs a repeat CT Scan as soon as possible -Will see back in 2 weeks via phone    FOLLOW UP: -ED for evaluation of acute abd with peritoneal signs -Labs today -PET/CT in 5-7 days -Phone visit with Dr Irene Limbo in 2 weeks  All of the patients questions were answered with apparent satisfaction. The patient knows to call the clinic with any problems, questions or concerns.  I spent 19mins  counseling  the patient face to face. The total time spent in the appointment was 60 mins and more than 50% was on counseling and direct patient cares.    Sullivan Lone MD [place] AAHIVMS [facility] Hematology/Oncology Physician [facility]  (Office):       [phone] (Work cell):  [phone] (Fax):           [phone]  12/22/2018 10:23 PM  I, Yevette Edwards, am acting as a scribe for Dr. Sullivan Lone.   .I have reviewed the above documentation for accuracy and completeness, and I agree with the above. Brunetta Genera MD

## 2018-12-23 ENCOUNTER — Encounter (HOSPITAL_COMMUNITY): Payer: Self-pay

## 2018-12-23 ENCOUNTER — Other Ambulatory Visit: Payer: Self-pay

## 2018-12-23 ENCOUNTER — Inpatient Hospital Stay (HOSPITAL_COMMUNITY)
Admission: EM | Admit: 2018-12-23 | Discharge: 2018-12-26 | DRG: 815 | Disposition: A | Discharge status: Home / Self Care | Payer: Medicare HMO | Source: Ambulatory Visit | Attending: Internal Medicine | Admitting: Internal Medicine

## 2018-12-23 ENCOUNTER — Inpatient Hospital Stay: Payer: Medicare HMO | Attending: Hematology | Admitting: Hematology

## 2018-12-23 ENCOUNTER — Emergency Department (HOSPITAL_COMMUNITY): Payer: Medicare HMO

## 2018-12-23 VITALS — BP 136/87 | HR 109 | Temp 98.0°F | Resp 18 | Ht 65.0 in | Wt 157.0 lb

## 2018-12-23 DIAGNOSIS — I2510 Atherosclerotic heart disease of native coronary artery without angina pectoris: Secondary | ICD-10-CM | POA: Diagnosis present

## 2018-12-23 DIAGNOSIS — D7389 Other diseases of spleen: Secondary | ICD-10-CM | POA: Diagnosis not present

## 2018-12-23 DIAGNOSIS — D808 Other immunodeficiencies with predominantly antibody defects: Secondary | ICD-10-CM | POA: Diagnosis not present

## 2018-12-23 DIAGNOSIS — R1012 Left upper quadrant pain: Secondary | ICD-10-CM | POA: Diagnosis not present

## 2018-12-23 DIAGNOSIS — I4891 Unspecified atrial fibrillation: Secondary | ICD-10-CM | POA: Diagnosis not present

## 2018-12-23 DIAGNOSIS — R590 Localized enlarged lymph nodes: Secondary | ICD-10-CM | POA: Diagnosis not present

## 2018-12-23 DIAGNOSIS — R918 Other nonspecific abnormal finding of lung field: Secondary | ICD-10-CM

## 2018-12-23 DIAGNOSIS — I129 Hypertensive chronic kidney disease with stage 1 through stage 4 chronic kidney disease, or unspecified chronic kidney disease: Secondary | ICD-10-CM | POA: Diagnosis present

## 2018-12-23 DIAGNOSIS — R609 Edema, unspecified: Secondary | ICD-10-CM

## 2018-12-23 DIAGNOSIS — E782 Mixed hyperlipidemia: Secondary | ICD-10-CM | POA: Diagnosis present

## 2018-12-23 DIAGNOSIS — R591 Generalized enlarged lymph nodes: Secondary | ICD-10-CM

## 2018-12-23 DIAGNOSIS — Z87891 Personal history of nicotine dependence: Secondary | ICD-10-CM | POA: Diagnosis not present

## 2018-12-23 DIAGNOSIS — N132 Hydronephrosis with renal and ureteral calculous obstruction: Secondary | ICD-10-CM | POA: Diagnosis not present

## 2018-12-23 DIAGNOSIS — R112 Nausea with vomiting, unspecified: Secondary | ICD-10-CM | POA: Diagnosis not present

## 2018-12-23 DIAGNOSIS — N1330 Unspecified hydronephrosis: Secondary | ICD-10-CM

## 2018-12-23 DIAGNOSIS — R066 Hiccough: Secondary | ICD-10-CM | POA: Diagnosis present

## 2018-12-23 DIAGNOSIS — N400 Enlarged prostate without lower urinary tract symptoms: Secondary | ICD-10-CM | POA: Insufficient documentation

## 2018-12-23 DIAGNOSIS — R1032 Left lower quadrant pain: Secondary | ICD-10-CM | POA: Diagnosis not present

## 2018-12-23 DIAGNOSIS — K56609 Unspecified intestinal obstruction, unspecified as to partial versus complete obstruction: Secondary | ICD-10-CM | POA: Diagnosis present

## 2018-12-23 DIAGNOSIS — E785 Hyperlipidemia, unspecified: Secondary | ICD-10-CM | POA: Diagnosis not present

## 2018-12-23 DIAGNOSIS — Z801 Family history of malignant neoplasm of trachea, bronchus and lung: Secondary | ICD-10-CM | POA: Diagnosis not present

## 2018-12-23 DIAGNOSIS — I10 Essential (primary) hypertension: Secondary | ICD-10-CM

## 2018-12-23 DIAGNOSIS — Z79899 Other long term (current) drug therapy: Secondary | ICD-10-CM

## 2018-12-23 DIAGNOSIS — Z8719 Personal history of other diseases of the digestive system: Secondary | ICD-10-CM

## 2018-12-23 DIAGNOSIS — R109 Unspecified abdominal pain: Secondary | ICD-10-CM | POA: Diagnosis present

## 2018-12-23 DIAGNOSIS — Z20828 Contact with and (suspected) exposure to other viral communicable diseases: Secondary | ICD-10-CM | POA: Diagnosis not present

## 2018-12-23 DIAGNOSIS — Z833 Family history of diabetes mellitus: Secondary | ICD-10-CM | POA: Diagnosis not present

## 2018-12-23 DIAGNOSIS — Z7982 Long term (current) use of aspirin: Secondary | ICD-10-CM

## 2018-12-23 DIAGNOSIS — N1832 Chronic kidney disease, stage 3b: Secondary | ICD-10-CM | POA: Diagnosis present

## 2018-12-23 DIAGNOSIS — R161 Splenomegaly, not elsewhere classified: Secondary | ICD-10-CM | POA: Diagnosis not present

## 2018-12-23 DIAGNOSIS — K5900 Constipation, unspecified: Secondary | ICD-10-CM

## 2018-12-23 DIAGNOSIS — K219 Gastro-esophageal reflux disease without esophagitis: Secondary | ICD-10-CM | POA: Diagnosis present

## 2018-12-23 DIAGNOSIS — N179 Acute kidney failure, unspecified: Secondary | ICD-10-CM | POA: Diagnosis not present

## 2018-12-23 DIAGNOSIS — R188 Other ascites: Secondary | ICD-10-CM | POA: Diagnosis present

## 2018-12-23 DIAGNOSIS — K56699 Other intestinal obstruction unspecified as to partial versus complete obstruction: Secondary | ICD-10-CM | POA: Diagnosis present

## 2018-12-23 DIAGNOSIS — Z87442 Personal history of urinary calculi: Secondary | ICD-10-CM

## 2018-12-23 LAB — CBC WITH DIFFERENTIAL/PLATELET
Abs Immature Granulocytes: 0.05 10*3/uL (ref 0.00–0.07)
Basophils Absolute: 0.1 10*3/uL (ref 0.0–0.1)
Basophils Relative: 0 %
Eosinophils Absolute: 0.1 10*3/uL (ref 0.0–0.5)
Eosinophils Relative: 0 %
HCT: 36.9 % — ABNORMAL LOW (ref 39.0–52.0)
Hemoglobin: 12 g/dL — ABNORMAL LOW (ref 13.0–17.0)
Immature Granulocytes: 0 %
Lymphocytes Relative: 4 %
Lymphs Abs: 0.6 10*3/uL — ABNORMAL LOW (ref 0.7–4.0)
MCH: 30.1 pg (ref 26.0–34.0)
MCHC: 32.5 g/dL (ref 30.0–36.0)
MCV: 92.5 fL (ref 80.0–100.0)
Monocytes Absolute: 0.9 10*3/uL (ref 0.1–1.0)
Monocytes Relative: 6 %
Neutro Abs: 12.2 10*3/uL — ABNORMAL HIGH (ref 1.7–7.7)
Neutrophils Relative %: 90 %
Platelets: 369 10*3/uL (ref 150–400)
RBC: 3.99 MIL/uL — ABNORMAL LOW (ref 4.22–5.81)
RDW: 14.6 % (ref 11.5–15.5)
WBC: 13.8 10*3/uL — ABNORMAL HIGH (ref 4.0–10.5)
nRBC: 0 % (ref 0.0–0.2)

## 2018-12-23 LAB — COMPREHENSIVE METABOLIC PANEL
ALT: 19 U/L (ref 0–44)
AST: 25 U/L (ref 15–41)
Albumin: 3.6 g/dL (ref 3.5–5.0)
Alkaline Phosphatase: 93 U/L (ref 38–126)
Anion gap: 13 (ref 5–15)
BUN: 29 mg/dL — ABNORMAL HIGH (ref 8–23)
CO2: 23 mmol/L (ref 22–32)
Calcium: 9.2 mg/dL (ref 8.9–10.3)
Chloride: 100 mmol/L (ref 98–111)
Creatinine, Ser: 2.19 mg/dL — ABNORMAL HIGH (ref 0.61–1.24)
GFR calc Af Amer: 31 mL/min — ABNORMAL LOW (ref >60)
GFR calc non Af Amer: 26 mL/min — ABNORMAL LOW (ref >60)
Glucose, Bld: 125 mg/dL — ABNORMAL HIGH (ref 70–99)
Potassium: 3.7 mmol/L (ref 3.5–5.1)
Sodium: 136 mmol/L (ref 135–145)
Total Bilirubin: 1.1 mg/dL (ref 0.3–1.2)
Total Protein: 6.6 g/dL (ref 6.5–8.1)

## 2018-12-23 LAB — LIPASE, BLOOD: Lipase: 45 U/L (ref 11–51)

## 2018-12-23 LAB — URINALYSIS, ROUTINE W REFLEX MICROSCOPIC
Bilirubin Urine: NEGATIVE
Glucose, UA: NEGATIVE mg/dL
Hgb urine dipstick: NEGATIVE
Ketones, ur: NEGATIVE mg/dL
Leukocytes,Ua: NEGATIVE
Nitrite: NEGATIVE
Protein, ur: NEGATIVE mg/dL
Specific Gravity, Urine: 1.009 (ref 1.005–1.030)
pH: 5 (ref 5.0–8.0)

## 2018-12-23 MED ORDER — ENOXAPARIN SODIUM 30 MG/0.3ML ~~LOC~~ SOLN
30.0000 mg | Freq: Every day | SUBCUTANEOUS | Status: DC
  Administered 2018-12-24 – 2018-12-26 (×3): 30 mg via SUBCUTANEOUS
  Filled 2018-12-23 (×3): qty 0.3

## 2018-12-23 MED ORDER — ONDANSETRON HCL 4 MG/2ML IJ SOLN
4.0000 mg | Freq: Four times a day (QID) | INTRAMUSCULAR | Status: DC | PRN
  Administered 2018-12-23 – 2018-12-24 (×2): 4 mg via INTRAVENOUS
  Filled 2018-12-23 (×2): qty 2

## 2018-12-23 MED ORDER — LORATADINE 10 MG PO TABS
10.0000 mg | ORAL_TABLET | Freq: Every day | ORAL | Status: DC
  Administered 2018-12-24 – 2018-12-26 (×3): 10 mg via ORAL
  Filled 2018-12-23 (×3): qty 1

## 2018-12-23 MED ORDER — METOPROLOL SUCCINATE ER 25 MG PO TB24
25.0000 mg | ORAL_TABLET | Freq: Every day | ORAL | Status: DC
  Administered 2018-12-24 – 2018-12-25 (×2): 25 mg via ORAL
  Filled 2018-12-23 (×2): qty 1

## 2018-12-23 MED ORDER — SODIUM CHLORIDE 0.9 % IV SOLN
INTRAVENOUS | Status: DC
  Administered 2018-12-23: 23:00:00 via INTRAVENOUS

## 2018-12-23 MED ORDER — AMLODIPINE BESYLATE 5 MG PO TABS
5.0000 mg | ORAL_TABLET | Freq: Every day | ORAL | Status: DC
  Administered 2018-12-24 – 2018-12-25 (×2): 5 mg via ORAL
  Filled 2018-12-23 (×3): qty 1

## 2018-12-23 MED ORDER — IOHEXOL 300 MG/ML  SOLN
30.0000 mL | Freq: Once | INTRAMUSCULAR | Status: AC | PRN
  Administered 2018-12-23: 30 mL via ORAL

## 2018-12-23 MED ORDER — SODIUM CHLORIDE 0.9 % IV BOLUS
1000.0000 mL | Freq: Once | INTRAVENOUS | Status: AC
  Administered 2018-12-23: 1000 mL via INTRAVENOUS

## 2018-12-23 MED ORDER — FENTANYL CITRATE (PF) 100 MCG/2ML IJ SOLN
50.0000 ug | Freq: Once | INTRAMUSCULAR | Status: AC
  Administered 2018-12-23: 50 ug via INTRAVENOUS
  Filled 2018-12-23: qty 2

## 2018-12-23 MED ORDER — HYDROMORPHONE HCL 1 MG/ML IJ SOLN
0.5000 mg | INTRAMUSCULAR | Status: AC | PRN
  Administered 2018-12-23 – 2018-12-24 (×2): 0.5 mg via INTRAVENOUS
  Filled 2018-12-23 (×2): qty 0.5

## 2018-12-23 MED ORDER — ASPIRIN EC 81 MG PO TBEC
81.0000 mg | DELAYED_RELEASE_TABLET | Freq: Every day | ORAL | Status: DC
  Administered 2018-12-24 – 2018-12-26 (×3): 81 mg via ORAL
  Filled 2018-12-23 (×3): qty 1

## 2018-12-23 MED ORDER — PANTOPRAZOLE SODIUM 40 MG PO TBEC
40.0000 mg | DELAYED_RELEASE_TABLET | Freq: Every day | ORAL | Status: DC
  Administered 2018-12-24 – 2018-12-26 (×3): 40 mg via ORAL
  Filled 2018-12-23 (×3): qty 1

## 2018-12-23 MED ORDER — ACETAMINOPHEN 650 MG RE SUPP: 650.0000 mg | Freq: Four times a day (QID) | RECTAL | Status: DC | PRN

## 2018-12-23 MED ORDER — POLYETHYLENE GLYCOL 3350 17 GM/SCOOP PO POWD
1.0000 | Freq: Once | ORAL | Status: AC
  Administered 2018-12-24: 238 g via ORAL
  Filled 2018-12-23: qty 255

## 2018-12-23 MED ORDER — ACETAMINOPHEN 325 MG PO TABS: 650.0000 mg | ORAL_TABLET | Freq: Four times a day (QID) | ORAL | Status: DC | PRN

## 2018-12-23 MED ORDER — PRAVASTATIN SODIUM 20 MG PO TABS
20.0000 mg | ORAL_TABLET | Freq: Every day | ORAL | Status: DC
  Administered 2018-12-23 – 2018-12-25 (×3): 20 mg via ORAL
  Filled 2018-12-23 (×3): qty 1

## 2018-12-23 NOTE — ED Provider Notes (Signed)
85 y/o male presenting with abdominal pain. Noted to have generalized TTP. Labs with leukocytosis. CT abd/pelvis pending. Dispo per CT.  5:00 PM receved signout from Dr. Ronnald Nian 8:30 PM reviewed CT with patient, continuing to have pain, will discuss with GI 8:45 PM Discussed with [facility], recommend GoLYTELY bowel prep, hospitalist admission, they will see tomorrow am, placed consult order to hospitalist  Dr. Maudie Mercury will admit    Ricardo Starch, MD 12/24/18 0028

## 2018-12-23 NOTE — Patient Instructions (Signed)
Thank you for choosing [facility] to provide your oncology and hematology care.   Should you have questions after your visit to the [facility] ([facility]), please contact this office at [phone] between 8:30 AM and 4:30 PM. Voicemails left after 4:00 PM may not be returned until the following business day. Calls received after 4:30 PM will be answered by an off-site Nurse Triage Line.    Prescription Refills:  Please have your pharmacy contact us directly for most prescription requests.  Contact the office directly for refills of narcotics (pain medications). Allow 48-72 hours for refills.  Appointments: Please contact the [facility] scheduling department [phone] for questions regarding [facility] appointment scheduling.  Contact the schedulers with any scheduling changes so that your appointment can be rescheduled in a timely manner.   Central Scheduling for [place] [phone] - Call to schedule procedures such as PET scans, CT scans, MRI, Ultrasound, etc.  To afford each patient quality time with our providers, please arrive 30 minutes before your scheduled appointment time.  If you arrive late for your appointment, you may be asked to reschedule.  We strive to give you quality time with our providers, and arriving late affects you and other patients whose appointments are after yours. If you are a no show for multiple scheduled visits, you may be dismissed from the clinic at the providers discretion.     Resources: [facility] Social Workers [phone] for additional information on assistance programs --Anne Cunningham/Abigail Elmore  [place] DSS  [phone]: Information regarding food stamps, Medicaid, and utility assistance [employer] [phone]    [employer]'s shared-ride transportation service for eligible riders who have a disability that prevents them from riding the fixed route bus.   Medicare Rights Center 800-333-4114 Helps people with  Medicare understand their rights and benefits, navigate the Medicare system, and secure the quality healthcare they deserve [employer] 800-227-2345 Assists patients locate various types of support and financial assistance Cancer Care: 1-800-813-HOPE (4673) Provides financial assistance, online support groups, medication/co-pay assistance.      

## 2018-12-23 NOTE — H&P (Signed)
TRH H&P    Patient Demographics:    Ricardo Sloan, is a 85 y.o. male  MRN: [id number]  DOB - January 25, 1932  Admit Date - 12/23/2018  Referring MD/NP/PA:  Madalyn Rob MD  Outpatient Primary MD for the patient is Libby Maw, MD  Patient coming from:   home  Chief complaint-   Abdominal pain   HPI:    Ricardo Sloan  is a 85 y.o. male, w hypertension, hyperlipidemia, Jerrye Bushy Bph, h/o microscopic hematuria, nephrolithiasis, hx of RUL spiculated nodule on CT chest, Kappa Light chain disease,  w recent admission for hypercalcemia, presents with c/o abdominal pain, LUQ/ epigastric for the past 2-3 days.  Associated with n/v.  Pt denies fever, chills, cough, cp, palp, sob, diarrhea, brbpr, black stool.  Last BM yesterday In ED T 97.8, P 95 R 15, Bp 141/90  Pox 99% on RA  CT abd/ pelvis IMPRESSION: 1. 10 cm irregular mass centered in the medial spleen with abdominal/retroperitoneal adenopathy highly suspicious for malignancy with metastatic spread. Small amount of ascites. 2. Very large amount of stool within the proximal transverse colon with relative colonic obstruction at the splenic flexure likely from the irregular splenic mass. No pneumoperitoneum. 3. Moderate LEFT hydronephrosis and LEFT renal calculi again noted. 4. Unchanged small bilateral pleural effusions and bibasilar atelectasis. 5.  Aortic Atherosclerosis (ICD10-I70.0).  Na 136, K 3.7, Bun 29, Creatinine 2.19 Wbc 13.8, Hgb 12.0, Plt 369 Urinalysis negative   Pt will be admitted for abdominal pain secondary to 10cm irregular mass, w retroperitoneal adenopathy suspicious for adenopathy, w relative colonoic obstruction at the splenic flexure likely from mass.      Review of systems:    In addition to the HPI above,  No Fever-chills, No Headache, No changes with Vision or hearing, No problems swallowing food or Liquids, No Chest  pain, Cough or Shortness of Breath,  No Blood in stool or Urine, No dysuria, No new skin rashes or bruises, No new joints pains-aches,  No new weakness, tingling, numbness in any extremity, No recent weight gain or loss, No polyuria, polydypsia or polyphagia, No significant Mental Stressors.  All other systems reviewed and are negative.    Past History of the following :    Past Medical History:  Diagnosis Date  . BPH (benign prostatic hypertrophy)   . Colon polyps   . GERD (gastroesophageal reflux disease)    Upper endoscopy 2003, Dr. Velora Heckler  . Hemorrhoids   . Hiatal hernia 2010  . Hyperlipidemia   . Hypertension   . Internal hemorrhoids   . Microscopic hematuria 2001   Dr.  Luanne Bras  . Pneumonia     X 2 in high school      Past Surgical History:  Procedure Laterality Date  . COLONOSCOPY  [id number]   Internal hemorrhoids, Dr. Velora Heckler  . NOSE SURGERY     Submucosal resection in the 1960s  . TONSILLECTOMY        . UPPER GASTROINTESTINAL ENDOSCOPY  2003   GERD  . WISDOM TOOTH EXTRACTION  Social History:      Social History   Tobacco Use  . Smoking status: Former Smoker    Packs/day: 0.75    Years: 13.00    Pack years: 9.75    Types: Cigarettes, Pipe    Quit date: 03/26/1965    Years since quitting: 53.7  . Smokeless tobacco: Never Used  . Tobacco comment: smoked 1953-1968, up to 1 ppd  Substance Use Topics  . Alcohol use: Yes    Comment:  rarely socially; 1-2 / month       Family History :     Family History  Problem Relation Age of Onset  . Diabetes Mother   . Subarachnoid hemorrhage Daughter   . Lung cancer Brother        smoker  . Other Paternal Grandfather        typhoid  . Heart disease Neg Hx   . Stroke Neg Hx   . Hypertension Neg Hx   . Colon cancer Neg Hx        Home Medications:   Prior to Admission medications   Medication Sig Start Date End Date Taking? Authorizing Provider  amLODipine (NORVASC) 5 MG  tablet Take 1 tablet (5 mg total) by mouth daily. 01/27/18  Yes Libby Maw, MD  Aspirin (ADULT ASPIRIN LOW STRENGTH) 81 MG EC tablet Take 81 mg by mouth daily.     Yes [provider]  furosemide (LASIX) 20 MG tablet TAKE 1 TABLET(20 MG) BY MOUTH DAILY Patient taking differently: Take 20 mg by mouth daily.  12/18/18  Yes Libby Maw, MD  loratadine (CLARITIN) 10 MG tablet Take 10 mg by mouth daily.     Yes [provider]  metoprolol succinate (TOPROL-XL) 25 MG 24 hr tablet Take 1 tablet (25 mg total) by mouth daily. 01/27/18  Yes Libby Maw, MD  Multiple Vitamin (MULTIVITAMIN) tablet Take 1 tablet by mouth daily.     Yes [provider]  omeprazole (PRILOSEC) 40 MG capsule Take 1 capsule (40 mg total) by mouth daily. 01/27/18  Yes Libby Maw, MD  ondansetron (ZOFRAN) 4 MG tablet Take 1 tablet (4 mg total) by mouth every 6 (six) hours as needed for nausea. 12/13/18  Yes Mariel Aloe, MD  polyethylene glycol powder (GLYCOLAX/MIRALAX) 17 GM/SCOOP powder Mix 1 scoop daily with water until constipation is relieved. 12/04/18  Yes Libby Maw, MD  pravastatin (PRAVACHOL) 40 MG tablet TAKE ONE-HALF TABLET BY MOUTH EVERY NIGHT AT BEDTIME Patient taking differently: Take 20 mg by mouth at bedtime.  01/27/18  Yes Libby Maw, MD  vitamin B-12 (CYANOCOBALAMIN) 1000 MCG tablet Take 1,000 mcg by mouth daily.   Yes [provider]     Allergies:    No Known Allergies   Physical Exam:   Vitals  Blood pressure 128/72, pulse (!) 109, temperature 97.8 F (36.6 C), temperature source Oral, resp. rate 16, SpO2 95 %.  1.  General: axoxo3  2. Psychiatric: euthymic  3. Neurologic: cn2-12 intact, reflexes 2+ symmetric, diffuse with no clonus, motor 5/5 in all 4 ext  4. HEENMT:  Anicteric, pupils 1.42mm symmetric, direct, consensual, intact Neck: no jvd  5. Respiratory : ctab  6. Cardiovascular :  Tachy s1, s2, no m/g/r  7. Gastrointestinal:  Abd: soft, nt, nd, +bs  8. Skin:  Ext: no c/c/e,   9.Musculoskeletal:  Good ROM    Data Review:    CBC Recent Labs  Lab 12/18/18 1439 12/23/18  1516  WBC 6.5 13.8*  HGB 11.0* 12.0*  HCT 32.7* 36.9*  PLT 301.0 369  MCV 90.4 92.5  MCH  --  30.1  MCHC 33.7 32.5  RDW 14.5 14.6  LYMPHSABS  --  0.6*  MONOABS  --  0.9  EOSABS  --  0.1  BASOSABS  --  0.1   ------------------------------------------------------------------------------------------------------------------  Results for orders placed or performed during the hospital encounter of 12/23/18 (from the past 48 hour(s))  Comprehensive metabolic panel     Status: Abnormal   Collection Time: 12/23/18  3:16 PM  Result Value Ref Range   Sodium 136 135 - 145 mmol/L   Potassium 3.7 3.5 - 5.1 mmol/L   Chloride 100 98 - 111 mmol/L   CO2 23 22 - 32 mmol/L   Glucose, Bld 125 (H) 70 - 99 mg/dL   BUN 29 (H) 8 - 23 mg/dL   Creatinine, Ser 2.19 (H) 0.61 - 1.24 mg/dL   Calcium 9.2 8.9 - 10.3 mg/dL   Total Protein 6.6 6.5 - 8.1 g/dL   Albumin 3.6 3.5 - 5.0 g/dL   AST 25 15 - 41 U/L   ALT 19 0 - 44 U/L   Alkaline Phosphatase 93 38 - 126 U/L   Total Bilirubin 1.1 0.3 - 1.2 mg/dL   GFR calc non Af Amer 26 (L) >60 mL/min   GFR calc Af Amer 31 (L) >60 mL/min   Anion gap 13 5 - 15    Comment: Performed at [facility], [place] [address], [place], [place] [address]  Lipase, blood     Status: None   Collection Time: 12/23/18  3:16 PM  Result Value Ref Range   Lipase 45 11 - 51 U/L    Comment: Performed at [facility], [place] [address], [place], [place] [address]  CBC with Diff     Status: Abnormal   Collection Time: 12/23/18  3:16 PM  Result Value Ref Range   WBC 13.8 (H) 4.0 - 10.5 K/uL   RBC 3.99 (L) 4.22 - 5.81 MIL/uL   Hemoglobin 12.0 (L) 13.0 - 17.0 g/dL   HCT 36.9 (L) 39.0 - 52.0 %   MCV 92.5 80.0 - 100.0 fL   MCH 30.1 26.0 - 34.0 pg    MCHC 32.5 30.0 - 36.0 g/dL   RDW 14.6 11.5 - 15.5 %   Platelets 369 150 - 400 K/uL   nRBC 0.0 0.0 - 0.2 %   Neutrophils Relative % 90 %   Neutro Abs 12.2 (H) 1.7 - 7.7 K/uL   Lymphocytes Relative 4 %   Lymphs Abs 0.6 (L) 0.7 - 4.0 K/uL   Monocytes Relative 6 %   Monocytes Absolute 0.9 0.1 - 1.0 K/uL   Eosinophils Relative 0 %   Eosinophils Absolute 0.1 0.0 - 0.5 K/uL   Basophils Relative 0 %   Basophils Absolute 0.1 0.0 - 0.1 K/uL   Immature Granulocytes 0 %   Abs Immature Granulocytes 0.05 0.00 - 0.07 K/uL    Comment: Performed at [facility], [place] [address] [place], [place] [address]  Urinalysis, Routine w reflex microscopic     Status: None   Collection Time: 12/23/18  4:45 PM  Result Value Ref Range   Color, Urine YELLOW YELLOW   APPearance CLEAR CLEAR   Specific Gravity, Urine 1.009 1.005 - 1.030   pH 5.0 5.0 - 8.0   Glucose, UA NEGATIVE NEGATIVE mg/dL   Hgb urine dipstick NEGATIVE  NEGATIVE   Bilirubin Urine NEGATIVE NEGATIVE   Ketones, ur NEGATIVE NEGATIVE mg/dL   Protein, ur NEGATIVE NEGATIVE mg/dL   Nitrite NEGATIVE NEGATIVE   Leukocytes,Ua NEGATIVE NEGATIVE    Comment: Performed at [facility] [address] [place], [place] [address]    Chemistries  Recent Labs  Lab 12/18/18 1439 12/23/18 1516  NA 140 136  K 3.6 3.7  CL 106 100  CO2 25 23  GLUCOSE 147* 125*  BUN 27* 29*  CREATININE 1.85* 2.19*  CALCIUM 10.4 9.2  AST  --  25  ALT  --  19  ALKPHOS  --  93  BILITOT  --  1.1   ------------------------------------------------------------------------------------------------------------------  ------------------------------------------------------------------------------------------------------------------ GFR: Estimated Creatinine Clearance: 21.5 mL/min (A) (by C-G formula based on SCr of 2.19 mg/dL (H)). Liver Function Tests: Recent Labs  Lab 12/23/18 1516  AST 25  ALT 19  ALKPHOS 93  BILITOT 1.1  PROT  6.6  ALBUMIN 3.6   Recent Labs  Lab 12/23/18 1516  LIPASE 45   No results for input(s): AMMONIA in the last 168 hours. Coagulation Profile: No results for input(s): INR, PROTIME in the last 168 hours. Cardiac Enzymes: No results for input(s): CKTOTAL, CKMB, CKMBINDEX, TROPONINI in the last 168 hours. BNP (last 3 results) Recent Labs    12/18/18 1439  PROBNP 228.0*   HbA1C: No results for input(s): HGBA1C in the last 72 hours. CBG: No results for input(s): GLUCAP in the last 168 hours. Lipid Profile: No results for input(s): CHOL, HDL, LDLCALC, TRIG, CHOLHDL, LDLDIRECT in the last 72 hours. Thyroid Function Tests: No results for input(s): TSH, T4TOTAL, FREET4, T3FREE, THYROIDAB in the last 72 hours. Anemia Panel: No results for input(s): VITAMINB12, FOLATE, FERRITIN, TIBC, IRON, RETICCTPCT in the last 72 hours.  --------------------------------------------------------------------------------------------------------------- Urine analysis:    Component Value Date/Time   COLORURINE YELLOW 12/23/2018 1645   APPEARANCEUR CLEAR 12/23/2018 1645   LABSPEC 1.009 12/23/2018 1645   PHURINE 5.0 12/23/2018 1645   GLUCOSEU NEGATIVE 12/23/2018 1645   GLUCOSEU NEGATIVE 11/24/2018 1507   HGBUR NEGATIVE 12/23/2018 1645   HGBUR negative 10/03/2009 0000   BILIRUBINUR NEGATIVE 12/23/2018 1645   KETONESUR NEGATIVE 12/23/2018 1645   PROTEINUR NEGATIVE 12/23/2018 1645   UROBILINOGEN 0.2 11/24/2018 1507   NITRITE NEGATIVE 12/23/2018 1645   LEUKOCYTESUR NEGATIVE 12/23/2018 1645      Imaging Results:    Ct Abdomen Pelvis Wo Contrast  Result Date: 12/23/2018 CLINICAL DATA:  85 year old male with acute abdominal and pelvic pain with distension. EXAM: CT ABDOMEN AND PELVIS WITHOUT CONTRAST TECHNIQUE: Multidetector CT imaging of the abdomen and pelvis was performed following the standard protocol without IV contrast. COMPARISON:  12/11/2018 FINDINGS: Please note that parenchymal abnormalities  may be missed without intravenous contrast. Lower chest: Small bilateral pleural effusions and mild bibasilar atelectasis again noted. Hepatobiliary: The liver and gallbladder are unremarkable except for a stable hepatic cyst. No biliary dilatation. Pancreas: No definite abnormality Spleen: Heterogeneous enlarged spleen noted with irregular 10 cm hypoechoic area along the splenic hilum is worrisome for malignancy. Adrenals/Urinary Tract: Bilateral renal atrophy, moderate LEFT hydronephrosis and LEFT renal calculi again noted. The adrenal glands are unremarkable. Bladder is unremarkable. Stomach/Bowel: A very large amount of stool is noted within the proximal and transverse colon. There is a transition point at the splenic flexure at the irregular splenic mass with collapsed descending and sigmoid colon. No other definite bowel abnormalities are noted. Vascular/Lymphatic: Aortic atherosclerosis. Enlarged retroperitoneal/iliac lymph nodes are noted with the  largest lymph node measuring 3.2 cm (series 2: Image 20). Reproductive: Mild prostate enlargement noted. Other: A small amount of ascites within the abdomen and pelvis noted. No pneumoperitoneum. Musculoskeletal: No acute or suspicious bony abnormalities are noted. Multilevel degenerative disc disease, spondylosis and facet arthropathy within the lumbar spine noted. IMPRESSION: 1. 10 cm irregular mass centered in the medial spleen with abdominal/retroperitoneal adenopathy highly suspicious for malignancy with metastatic spread. Small amount of ascites. 2. Very large amount of stool within the proximal transverse colon with relative colonic obstruction at the splenic flexure likely from the irregular splenic mass. No pneumoperitoneum. 3. Moderate LEFT hydronephrosis and LEFT renal calculi again noted. 4. Unchanged small bilateral pleural effusions and bibasilar atelectasis. 5.  Aortic Atherosclerosis (ICD10-I70.0). Electronically Signed   By: Margarette Canada M.D.   On:  12/23/2018 20:05       Assessment & Plan:    Principal Problem:   Abdominal pain Active Problems:   Essential hypertension   Bowel obstruction (HCC)   Splenic mass  Abdominal pain secondary to constipation, obstruction, splenic mass NPO except for medications Miralax Dilaudid 0.5mg  iv q4h prn x2 GI consulted by ED, per ED, will be by in AM  Nausea/ vomitting Zofran 4mg  iv q6h prn   Splenic mass Consider different biopsy options  Hypertension Cont Amlodipine 5mg  po qday (note this medication can cause constipation) Cont Metoprolol XL  25mg  po qday  Hyperlipidemia Cont Pravastatin 20mg  po qhs  Gerd Cont PPI  H/o Hypercalcemia, Kappa Light Chain disease F/u as outpatient    DVT Prophylaxis-   Lovenox - SCDs   AM Labs Ordered, also please review Full Orders  Family Communication: Admission, patients condition and plan of care including tests being ordered have been discussed with the patient  who indicate understanding and agree with the plan and Code Status.  Code Status:  FULL CODE per patient, son is present with patient in ED  Admission status: Observation/: Based on patients clinical presentation and evaluation of above clinical data, I have made determination that patient meets observation criteria at this time.   Time spent in minutes : 70   Jani Gravel M.D on 12/23/2018 at 9:16 PM

## 2018-12-23 NOTE — ED Notes (Signed)
Pt transported to CT ?

## 2018-12-23 NOTE — ED Notes (Signed)
Patient aware of urine need. Urinal at the bedside.

## 2018-12-23 NOTE — Progress Notes (Signed)
Transported via W/C to [facility] ED per Dr.Kale's instructions to room as assigned by ED CN. Patient A&Ox3, c/o abd pain for several days. Pt verbalized understanding of need to go to ED. Assisted patient to restroom and then to stretcher. Report given to Montel Clock RN.

## 2018-12-23 NOTE — ED Triage Notes (Signed)
Pt from [facility] center being seen for new pt visit.  Pt sent for acute abdomen with LLQ with rebound.  Pt c/o of pain across abdomen,  Pt states last BM was this morning, and had a 1 episode of emesis.  Vital signs WNL at [facility] center

## 2018-12-23 NOTE — ED Provider Notes (Signed)
[facility] DEPT Provider Note   CSN: [id number] Arrival date & time: 12/23/18  1425     History   Chief Complaint Chief Complaint  Patient presents with  . Abdominal Pain    HPI Ricardo Sloan is a 85 y.o. male.     The history is provided by the patient.  Abdominal Pain Pain location:  LLQ and periumbilical Pain quality: aching and dull   Pain radiates to:  Does not radiate Pain severity:  Mild Onset quality:  Gradual Duration:  3 days Timing:  Intermittent Progression:  Waxing and waning Chronicity:  New Context: not previous surgeries   Relieved by:  Nothing Worsened by:  Palpation and movement Associated symptoms: no anorexia, no belching, no chest pain, no chills, no constipation, no cough, no diarrhea, no dysuria, no fatigue, no fever, no hematemesis, no hematochezia, no hematuria, no melena, no nausea, no shortness of breath, no sore throat and no vomiting   Risk factors: has not had multiple surgeries     Past Medical History:  Diagnosis Date  . BPH (benign prostatic hypertrophy)   . Colon polyps   . GERD (gastroesophageal reflux disease)    Upper endoscopy 2003, Dr. Velora Heckler  . Hemorrhoids   . Hiatal hernia 2010  . Hyperlipidemia   . Hypertension   . Internal hemorrhoids   . Microscopic hematuria 2001   Dr.  Luanne Bras  . Pneumonia     X 2 in high school    Patient Active Problem List   Diagnosis Date Noted  . Edema 12/18/2018  . Hypercalcemia 12/05/2018  . Constipation 12/04/2018  . AKI (acute kidney injury) ([place]) 12/04/2018  . Decreased GFR 11/27/2018  . Hypotension 11/27/2018  . Fatigue 11/27/2018  . ASCVD (arteriosclerotic cardiovascular disease) 08/06/2017  . Screening for prostate cancer 10/24/2015  . Lung nodules 10/24/2015  . Prediabetes 10/21/2014  . Anemia 10/21/2014  . Abnormal stress test 08/26/2014  . Heart palpitations 08/26/2014  . Abnormal chest x-ray 10/15/2012  . Bronchiectasis  without complication ([place]) 69/62/9528  . Mass of right lung 08/09/2009  . GERD 10/15/2008  . DYSPHAGIA 10/15/2008  . ERECTILE DYSFUNCTION 07/09/2007  . Mixed hyperlipidemia 07/15/2006  . Essential hypertension 07/15/2006    Past Surgical History:  Procedure Laterality Date  . COLONOSCOPY  [id number]   Internal hemorrhoids, Dr. Velora Heckler  . NOSE SURGERY     Submucosal resection in the 1960s  . TONSILLECTOMY        . UPPER GASTROINTESTINAL ENDOSCOPY  2003   GERD  . WISDOM TOOTH EXTRACTION          Home Medications    Prior to Admission medications   Medication Sig Start Date End Date Taking? Authorizing Provider  amLODipine (NORVASC) 5 MG tablet Take 1 tablet (5 mg total) by mouth daily. 01/27/18  Yes Libby Maw, MD  Aspirin (ADULT ASPIRIN LOW STRENGTH) 81 MG EC tablet Take 81 mg by mouth daily.     Yes [provider]  furosemide (LASIX) 20 MG tablet TAKE 1 TABLET(20 MG) BY MOUTH DAILY Patient taking differently: Take 20 mg by mouth daily.  12/18/18  Yes Libby Maw, MD  loratadine (CLARITIN) 10 MG tablet Take 10 mg by mouth daily.     Yes [provider]  metoprolol succinate (TOPROL-XL) 25 MG 24 hr tablet Take 1 tablet (25 mg total) by mouth daily. 01/27/18  Yes Libby Maw, MD  Multiple Vitamin (MULTIVITAMIN) tablet Take 1 tablet by mouth  daily.     Yes [provider]  omeprazole (PRILOSEC) 40 MG capsule Take 1 capsule (40 mg total) by mouth daily. 01/27/18  Yes Libby Maw, MD  ondansetron (ZOFRAN) 4 MG tablet Take 1 tablet (4 mg total) by mouth every 6 (six) hours as needed for nausea. 12/13/18  Yes Mariel Aloe, MD  polyethylene glycol powder (GLYCOLAX/MIRALAX) 17 GM/SCOOP powder Mix 1 scoop daily with water until constipation is relieved. 12/04/18  Yes Libby Maw, MD  pravastatin (PRAVACHOL) 40 MG tablet TAKE ONE-HALF TABLET BY MOUTH EVERY NIGHT AT BEDTIME Patient taking differently: Take 20  mg by mouth at bedtime.  01/27/18  Yes Libby Maw, MD  vitamin B-12 (CYANOCOBALAMIN) 1000 MCG tablet Take 1,000 mcg by mouth daily.   Yes [provider]    Family History Family History  Problem Relation Age of Onset  . Diabetes Mother   . Subarachnoid hemorrhage Daughter   . Lung cancer Brother        smoker  . Other Paternal Grandfather        typhoid  . Heart disease Neg Hx   . Stroke Neg Hx   . Hypertension Neg Hx   . Colon cancer Neg Hx     Social History Social History   Tobacco Use  . Smoking status: Former Smoker    Packs/day: 0.75    Years: 13.00    Pack years: 9.75    Types: Cigarettes, Pipe    Quit date: 03/26/1965    Years since quitting: 53.7  . Smokeless tobacco: Never Used  . Tobacco comment: smoked 1953-1968, up to 1 ppd  Substance Use Topics  . Alcohol use: Yes    Comment:  rarely socially; 1-2 / month  . Drug use: No     Allergies   Patient has no known allergies.   Review of Systems Review of Systems  Constitutional: Negative for chills, fatigue and fever.  HENT: Negative for ear pain and sore throat.   Eyes: Negative for pain and visual disturbance.  Respiratory: Negative for cough and shortness of breath.   Cardiovascular: Negative for chest pain and palpitations.  Gastrointestinal: Positive for abdominal pain. Negative for anorexia, constipation, diarrhea, hematemesis, hematochezia, melena, nausea and vomiting.  Genitourinary: Negative for dysuria and hematuria.  Musculoskeletal: Negative for arthralgias and back pain.  Skin: Negative for color change and rash.  Neurological: Negative for seizures and syncope.  All other systems reviewed and are negative.    Physical Exam Updated Vital Signs  ED Triage Vitals  Enc Vitals Group     BP 12/23/18 1437 (!) 141/90     Pulse Rate 12/23/18 1437 95     Resp 12/23/18 1437 15     Temp 12/23/18 1437 97.8 F (36.6 C)     Temp Source 12/23/18 1437 Oral     SpO2 12/23/18  1436 99 %     Weight --      Height --      Head Circumference --      Peak Flow --      Pain Score --      Pain Loc --      Pain Edu? --      Excl. in [place]? --     Physical Exam Vitals signs and nursing note reviewed.  Constitutional:      Appearance: He is well-developed.  HENT:     Head: Normocephalic and atraumatic.     Mouth/Throat:  Mouth: Mucous membranes are moist.  Eyes:     Extraocular Movements: Extraocular movements intact.     Conjunctiva/sclera: Conjunctivae normal.  Neck:     Musculoskeletal: Neck supple.  Cardiovascular:     Rate and Rhythm: Normal rate and regular rhythm.     Heart sounds: Murmur present.  Pulmonary:     Effort: Pulmonary effort is normal. No respiratory distress.     Breath sounds: Normal breath sounds.  Abdominal:     General: There is no distension.     Palpations: Abdomen is soft.     Tenderness: There is abdominal tenderness in the suprapubic area and left lower quadrant. There is guarding and rebound. There is no right CVA tenderness or left CVA tenderness. Negative signs include Murphy's sign, Rovsing's sign, McBurney's sign and psoas sign.  Skin:    General: Skin is warm and dry.     Capillary Refill: Capillary refill takes less than 2 seconds.  Neurological:     General: No focal deficit present.     Mental Status: He is alert.      ED Treatments / Results  Labs (all labs ordered are listed, but only abnormal results are displayed) Labs Reviewed  COMPREHENSIVE METABOLIC PANEL - Abnormal; Notable for the following components:      Result Value   Glucose, Bld 125 (*)    BUN 29 (*)    Creatinine, Ser 2.19 (*)    GFR calc non Af Amer 26 (*)    GFR calc Af Amer 31 (*)    All other components within normal limits  CBC WITH DIFFERENTIAL/PLATELET - Abnormal; Notable for the following components:   WBC 13.8 (*)    RBC 3.99 (*)    Hemoglobin 12.0 (*)    HCT 36.9 (*)    Neutro Abs 12.2 (*)    Lymphs Abs 0.6 (*)    All  other components within normal limits  URINE CULTURE  LIPASE, BLOOD  URINALYSIS, ROUTINE W REFLEX MICROSCOPIC    EKG None  Radiology No results found.  Procedures Procedures (including critical care time)  Medications Ordered in ED Medications  fentaNYL (SUBLIMAZE) injection 50 mcg (50 mcg Intravenous Given 12/23/18 1518)  sodium chloride 0.9 % bolus 1,000 mL (1,000 mLs Intravenous New Bag/Given 12/23/18 1519)     Initial Impression / Assessment and Plan / ED Course  I have reviewed the triage vital signs and the nursing notes.  Pertinent labs & imaging results that were available during my care of the patient were reviewed by me and considered in my medical decision making (see chart for details).     Ricardo Sloan is an 85 year old male with history of BPH, hypertension, high cholesterol who presents the ED with abdominal pain.  Patient with normal vitals.  No fever.  Patient recently admitted for hypercalcemia and concern for oncology process/multiple myeloma.  Was following up at oncologist today and was sent for evaluation due to his abdominal pain.  Patient states that pain is been ongoing for the last several days.  Had one episode of emesis but has not had any since.  States that he had a bowel movement this morning.  Denies any history of abdominal surgeries.  Denies any urinary symptoms.  Patient with tenderness mostly in the lower abdomen worse in the left lower quadrant.  No history of diverticulitis.  Overall patient with normal vitals.  However will get a CT scan of his abdomen and pelvis to rule out any intra-abdominal  process including diverticulitis, bowel obstruction.  Will evaluate for UTI.  Will check basic labs as well.  Will give IV fluids, IV fentanyl.  Will reevaluate.  Patient with white count of 13.8 but otherwise no significant anemia.  Creatinine at baseline.  No significant electrolyte abnormality.  Calcium is within normal limits.  Patient awaiting CT  scan of abdomen and pelvis and urinalysis.  Signed out to oncoming ED staff with patient pending this work-up.  Please see their note for further results, evaluation, disposition of the patient.  This chart was dictated using voice recognition software.  Despite best efforts to proofread,  errors can occur which can change the documentation meaning.    Final Clinical Impressions(s) / ED Diagnoses   Final diagnoses:  Left lower quadrant abdominal pain    ED Discharge Orders    None       Lennice Sites, DO 12/23/18 1633

## 2018-12-24 ENCOUNTER — Telehealth: Payer: Self-pay | Admitting: Internal Medicine

## 2018-12-24 ENCOUNTER — Telehealth: Payer: Self-pay | Admitting: Hematology

## 2018-12-24 ENCOUNTER — Inpatient Hospital Stay (HOSPITAL_COMMUNITY): Payer: Medicare HMO

## 2018-12-24 DIAGNOSIS — Z7982 Long term (current) use of aspirin: Secondary | ICD-10-CM | POA: Diagnosis not present

## 2018-12-24 DIAGNOSIS — N132 Hydronephrosis with renal and ureteral calculous obstruction: Secondary | ICD-10-CM | POA: Diagnosis not present

## 2018-12-24 DIAGNOSIS — Z833 Family history of diabetes mellitus: Secondary | ICD-10-CM | POA: Diagnosis not present

## 2018-12-24 DIAGNOSIS — R918 Other nonspecific abnormal finding of lung field: Secondary | ICD-10-CM | POA: Diagnosis not present

## 2018-12-24 DIAGNOSIS — R161 Splenomegaly, not elsewhere classified: Secondary | ICD-10-CM | POA: Diagnosis not present

## 2018-12-24 DIAGNOSIS — I129 Hypertensive chronic kidney disease with stage 1 through stage 4 chronic kidney disease, or unspecified chronic kidney disease: Secondary | ICD-10-CM | POA: Diagnosis present

## 2018-12-24 DIAGNOSIS — R188 Other ascites: Secondary | ICD-10-CM | POA: Diagnosis not present

## 2018-12-24 DIAGNOSIS — I2510 Atherosclerotic heart disease of native coronary artery without angina pectoris: Secondary | ICD-10-CM | POA: Diagnosis present

## 2018-12-24 DIAGNOSIS — Z20828 Contact with and (suspected) exposure to other viral communicable diseases: Secondary | ICD-10-CM | POA: Diagnosis not present

## 2018-12-24 DIAGNOSIS — K56699 Other intestinal obstruction unspecified as to partial versus complete obstruction: Secondary | ICD-10-CM | POA: Diagnosis not present

## 2018-12-24 DIAGNOSIS — K219 Gastro-esophageal reflux disease without esophagitis: Secondary | ICD-10-CM | POA: Diagnosis not present

## 2018-12-24 DIAGNOSIS — N1330 Unspecified hydronephrosis: Secondary | ICD-10-CM

## 2018-12-24 DIAGNOSIS — Z87891 Personal history of nicotine dependence: Secondary | ICD-10-CM | POA: Diagnosis not present

## 2018-12-24 DIAGNOSIS — E782 Mixed hyperlipidemia: Secondary | ICD-10-CM | POA: Diagnosis present

## 2018-12-24 DIAGNOSIS — Z801 Family history of malignant neoplasm of trachea, bronchus and lung: Secondary | ICD-10-CM | POA: Diagnosis not present

## 2018-12-24 DIAGNOSIS — N400 Enlarged prostate without lower urinary tract symptoms: Secondary | ICD-10-CM | POA: Diagnosis present

## 2018-12-24 DIAGNOSIS — N1832 Chronic kidney disease, stage 3b: Secondary | ICD-10-CM | POA: Diagnosis present

## 2018-12-24 DIAGNOSIS — I4891 Unspecified atrial fibrillation: Secondary | ICD-10-CM | POA: Diagnosis not present

## 2018-12-24 DIAGNOSIS — R112 Nausea with vomiting, unspecified: Secondary | ICD-10-CM | POA: Diagnosis not present

## 2018-12-24 DIAGNOSIS — E785 Hyperlipidemia, unspecified: Secondary | ICD-10-CM | POA: Diagnosis not present

## 2018-12-24 DIAGNOSIS — D808 Other immunodeficiencies with predominantly antibody defects: Secondary | ICD-10-CM | POA: Diagnosis not present

## 2018-12-24 DIAGNOSIS — N179 Acute kidney failure, unspecified: Secondary | ICD-10-CM | POA: Diagnosis not present

## 2018-12-24 DIAGNOSIS — R1012 Left upper quadrant pain: Secondary | ICD-10-CM | POA: Diagnosis not present

## 2018-12-24 DIAGNOSIS — R591 Generalized enlarged lymph nodes: Secondary | ICD-10-CM | POA: Diagnosis not present

## 2018-12-24 DIAGNOSIS — D7389 Other diseases of spleen: Secondary | ICD-10-CM | POA: Diagnosis not present

## 2018-12-24 DIAGNOSIS — R590 Localized enlarged lymph nodes: Secondary | ICD-10-CM | POA: Diagnosis not present

## 2018-12-24 DIAGNOSIS — Z79899 Other long term (current) drug therapy: Secondary | ICD-10-CM | POA: Diagnosis not present

## 2018-12-24 DIAGNOSIS — R1032 Left lower quadrant pain: Secondary | ICD-10-CM | POA: Diagnosis present

## 2018-12-24 DIAGNOSIS — R066 Hiccough: Secondary | ICD-10-CM | POA: Diagnosis present

## 2018-12-24 LAB — CBC
HCT: 33.1 % — ABNORMAL LOW (ref 39.0–52.0)
Hemoglobin: 11 g/dL — ABNORMAL LOW (ref 13.0–17.0)
MCH: 30.6 pg (ref 26.0–34.0)
MCHC: 33.2 g/dL (ref 30.0–36.0)
MCV: 92.2 fL (ref 80.0–100.0)
Platelets: 369 10*3/uL (ref 150–400)
RBC: 3.59 MIL/uL — ABNORMAL LOW (ref 4.22–5.81)
RDW: 14.5 % (ref 11.5–15.5)
WBC: 24.2 10*3/uL — ABNORMAL HIGH (ref 4.0–10.5)
nRBC: 0 % (ref 0.0–0.2)

## 2018-12-24 LAB — COMPREHENSIVE METABOLIC PANEL
ALT: 15 U/L (ref 0–44)
AST: 19 U/L (ref 15–41)
Albumin: 2.9 g/dL — ABNORMAL LOW (ref 3.5–5.0)
Alkaline Phosphatase: 76 U/L (ref 38–126)
Anion gap: 11 (ref 5–15)
BUN: 36 mg/dL — ABNORMAL HIGH (ref 8–23)
CO2: 22 mmol/L (ref 22–32)
Calcium: 8 mg/dL — ABNORMAL LOW (ref 8.9–10.3)
Chloride: 104 mmol/L (ref 98–111)
Creatinine, Ser: 1.93 mg/dL — ABNORMAL HIGH (ref 0.61–1.24)
GFR calc Af Amer: 36 mL/min — ABNORMAL LOW (ref >60)
GFR calc non Af Amer: 31 mL/min — ABNORMAL LOW (ref >60)
Glucose, Bld: 132 mg/dL — ABNORMAL HIGH (ref 70–99)
Potassium: 3.6 mmol/L (ref 3.5–5.1)
Sodium: 137 mmol/L (ref 135–145)
Total Bilirubin: 1.1 mg/dL (ref 0.3–1.2)
Total Protein: 5.4 g/dL — ABNORMAL LOW (ref 6.5–8.1)

## 2018-12-24 LAB — URINE CULTURE: Culture: NO GROWTH

## 2018-12-24 MED ORDER — MORPHINE SULFATE (PF) 2 MG/ML IV SOLN
2.0000 mg | INTRAVENOUS | Status: DC | PRN
  Filled 2018-12-24: qty 1

## 2018-12-24 MED ORDER — SODIUM CHLORIDE 0.9 % IV SOLN
12.5000 mg | Freq: Four times a day (QID) | INTRAVENOUS | Status: DC | PRN
  Administered 2018-12-24: 12.5 mg via INTRAVENOUS
  Filled 2018-12-24 (×2): qty 0.5

## 2018-12-24 MED ORDER — ONDANSETRON HCL 4 MG/2ML IJ SOLN
4.0000 mg | Freq: Once | INTRAMUSCULAR | Status: AC
  Administered 2018-12-24: 4 mg via INTRAVENOUS
  Filled 2018-12-24: qty 2

## 2018-12-24 MED ORDER — SORBITOL 70 % SOLN
960.0000 mL | TOPICAL_OIL | Freq: Once | ORAL | Status: DC
  Filled 2018-12-24: qty 473

## 2018-12-24 MED ORDER — ONDANSETRON HCL 4 MG/2ML IJ SOLN
4.0000 mg | Freq: Three times a day (TID) | INTRAMUSCULAR | Status: DC
  Administered 2018-12-24 – 2018-12-26 (×6): 4 mg via INTRAVENOUS
  Filled 2018-12-24 (×7): qty 2

## 2018-12-24 MED ORDER — SIMETHICONE 40 MG/0.6ML PO SUSP
40.0000 mg | Freq: Four times a day (QID) | ORAL | Status: DC
  Administered 2018-12-24 – 2018-12-26 (×7): 40 mg via ORAL
  Filled 2018-12-24 (×10): qty 0.6

## 2018-12-24 NOTE — Consult Note (Addendum)
Consultation  Referring Provider: [facility] Posey Pronto Primary Care Physician:  Libby Maw, MD Primary Gastroenterologist:  Dr.Perry - remote  Reason for Consultation: Acute abdominal pain, nausea, vomiting , hiccups and abnormal chest and abdominal imaging.  HPI: Ricardo Sloan is a 85 y.o. male, with history of hypertension, hyperlipidemia, GERD, nephrolithiasis, and kappa light chain disease. He was admitted through the emergency room last evening with complaints of 2 to 3-day history of left upper quadrant abdominal pain hiccups which are aggravated by talking, nausea, inability to eat, associated with a couple of episodes of vomiting.  He has not had any associated abdominal distention, bowel movements have been normal, no melena or hematochezia. He says up until the past week he had been feeling fine and not having any GI issues. He had remote colonoscopy in 2006 which was negative and had EGD in 2010 which was negative. CT of the abdomen and pelvis last night without contrast showed a heterogeneous enlarged spleen, with a 10 cm irregular area along the splenic hilum very worrisome for malignancy, there was moderate left hydronephrosis and left renal calculi, large amount of stool in the colon with a transition point at the splenic flexure though no dilated bowel.  Also with abdominal and retroperitoneal adenopathy and a small amount of ascites.  CT of the chest had been done as an outpatient without contrast on 12/11/2018 showing scattered nodular densities bilaterally with interval increase particularly in the right upper lobe, small bilateral effusions and PET scan was suggested.   Patient had actually had oncology consultation as an outpatient yesterday with concerns for occult malignancy.  He was seen by Dr. Irene Limbo-, noted to have significant abdominal tenderness and was therefore sent to the emergency room.  Plan prior to admit per oncology was for PET scan. The notes state that  he had had a PET scan as an outpatient but he had a bone scan on 12/12/2018 which showed 2 adjacent foci of increased tracer activity over the right third and fourth anterior ribs question posttraumatic.  He had been hospitalized briefly 9/17 through 12/13/2018 with hypercalcemia    Past Medical History:  Diagnosis Date  . BPH (benign prostatic hypertrophy)   . Colon polyps   . GERD (gastroesophageal reflux disease)    Upper endoscopy 2003, Dr. Velora Heckler  . Hemorrhoids   . Hiatal hernia 2010  . Hyperlipidemia   . Hypertension   . Internal hemorrhoids   . Microscopic hematuria 2001   Dr.  Luanne Bras  . Pneumonia     X 2 in high school    Past Surgical History:  Procedure Laterality Date  . COLONOSCOPY  [id number]   Internal hemorrhoids, Dr. Velora Heckler  . NOSE SURGERY     Submucosal resection in the 1960s  . TONSILLECTOMY        . UPPER GASTROINTESTINAL ENDOSCOPY  2003   GERD  . WISDOM TOOTH EXTRACTION      Prior to Admission medications   Medication Sig Start Date End Date Taking? Authorizing Provider  amLODipine (NORVASC) 5 MG tablet Take 1 tablet (5 mg total) by mouth daily. 01/27/18  Yes Libby Maw, MD  Aspirin (ADULT ASPIRIN LOW STRENGTH) 81 MG EC tablet Take 81 mg by mouth daily.     Yes [provider]  furosemide (LASIX) 20 MG tablet TAKE 1 TABLET(20 MG) BY MOUTH DAILY Patient taking differently: Take 20 mg by mouth daily.  12/18/18  Yes Libby Maw, MD  loratadine (CLARITIN) 10  MG tablet Take 10 mg by mouth daily.     Yes [provider]  metoprolol succinate (TOPROL-XL) 25 MG 24 hr tablet Take 1 tablet (25 mg total) by mouth daily. 01/27/18  Yes Libby Maw, MD  Multiple Vitamin (MULTIVITAMIN) tablet Take 1 tablet by mouth daily.     Yes [provider]  omeprazole (PRILOSEC) 40 MG capsule Take 1 capsule (40 mg total) by mouth daily. 01/27/18  Yes Libby Maw, MD  ondansetron (ZOFRAN) 4 MG tablet  Take 1 tablet (4 mg total) by mouth every 6 (six) hours as needed for nausea. 12/13/18  Yes Mariel Aloe, MD  polyethylene glycol powder (GLYCOLAX/MIRALAX) 17 GM/SCOOP powder Mix 1 scoop daily with water until constipation is relieved. 12/04/18  Yes Libby Maw, MD  pravastatin (PRAVACHOL) 40 MG tablet TAKE ONE-HALF TABLET BY MOUTH EVERY NIGHT AT BEDTIME Patient taking differently: Take 20 mg by mouth at bedtime.  01/27/18  Yes Libby Maw, MD  vitamin B-12 (CYANOCOBALAMIN) 1000 MCG tablet Take 1,000 mcg by mouth daily.   Yes [provider]    Current Facility-Administered Medications  Medication Dose Route Frequency Provider Last Rate Last Dose  . acetaminophen (TYLENOL) tablet 650 mg  650 mg Oral Q6H PRN Jani Gravel, MD       Or  . acetaminophen (TYLENOL) suppository 650 mg  650 mg Rectal Q6H PRN Jani Gravel, MD      . amLODipine (NORVASC) tablet 5 mg  5 mg Oral Daily Jani Gravel, MD   5 mg at 12/24/18 [id number]  . aspirin EC tablet 81 mg  81 mg Oral Daily Jani Gravel, MD   81 mg at 12/24/18 [id number]  . chlorproMAZINE (THORAZINE) 12.5 mg in sodium chloride 0.9 % 25 mL IVPB  12.5 mg Intravenous Q6H PRN Lavina Hamman, MD      . enoxaparin (LOVENOX) injection 30 mg  30 mg Subcutaneous Daily Jani Gravel, MD   30 mg at 12/24/18 [id number]  . loratadine (CLARITIN) tablet 10 mg  10 mg Oral Daily Jani Gravel, MD   10 mg at 12/24/18 0830  . metoprolol succinate (TOPROL-XL) 24 hr tablet 25 mg  25 mg Oral Daily Jani Gravel, MD   25 mg at 12/24/18 0829  . morphine 2 MG/ML injection 2-4 mg  2-4 mg Intravenous Q3H PRN Lavina Hamman, MD      . ondansetron [facility]) injection 4 mg  4 mg Intravenous Q6H PRN Jani Gravel, MD   4 mg at 12/24/18 [id number]  . pantoprazole (PROTONIX) EC tablet 40 mg  40 mg Oral Daily Jani Gravel, MD   40 mg at 12/24/18 0829  . pravastatin (PRAVACHOL) tablet 20 mg  20 mg Oral QHS Jani Gravel, MD   20 mg at 12/23/18 2329  . sorbitol, milk of mag, mineral oil, glycerin (SMOG)  enema  960 mL Rectal Once Lavina Hamman, MD        Allergies as of 12/23/2018  . (No Known Allergies)    Family History  Problem Relation Age of Onset  . Diabetes Mother   . Subarachnoid hemorrhage Daughter   . Lung cancer Brother        smoker  . Other Paternal Grandfather        typhoid  . Heart disease Neg Hx   . Stroke Neg Hx   . Hypertension Neg Hx   . Colon cancer Neg Hx     Social History   Socioeconomic History  .  Marital status: Married    Spouse name: Not on file  . Number of children: Not on file  . Years of education: Not on file  . Highest education level: Not on file  Occupational History  . Not on file  Social Needs  . Financial resource strain: Not on file  . Food insecurity    Worry: Not on file    Inability: Not on file  . Transportation needs    Medical: Not on file    Non-medical: Not on file  Tobacco Use  . Smoking status: Former Smoker    Packs/day: 0.75    Years: 13.00    Pack years: 9.75    Types: Cigarettes, Pipe    Quit date: 03/26/1965    Years since quitting: 53.7  . Smokeless tobacco: Never Used  . Tobacco comment: smoked 1953-1968, up to 1 ppd  Substance and Sexual Activity  . Alcohol use: Yes    Comment:  rarely socially; 1-2 / month  . Drug use: No  . Sexual activity: Not on file  Lifestyle  . Physical activity    Days per week: Not on file    Minutes per session: Not on file  . Stress: Not on file  Relationships  . Social Herbalist on phone: Not on file    Gets together: Not on file    Attends religious service: Not on file    Active member of club or organization: Not on file    Attends meetings of clubs or organizations: Not on file    Relationship status: Not on file  . Intimate partner violence    Fear of current or ex partner: Not on file    Emotionally abused: Not on file    Physically abused: Not on file    Forced sexual activity: Not on file  Other Topics Concern  . Not on file  Social  History Narrative   Daily caffeine    Daily care of wife; becoming more disabled; but still cooks;    Stress 1-10; 2      epworth sleepiness scale = 10 (11/02/2015)   Fun/[place]: Garden   Denies abuse and feels safe at home.     Review of Systems: Pertinent positive and negative review of systems were noted in the above HPI section.  All other review of systems was otherwise negative. Physical Exam: Vital signs in last 24 hours: Temp:  [97.8 F (36.6 C)-99.7 F (37.6 C)] 99.7 F (37.6 C) (09/30 0519) Pulse Rate:  [95-109] 103 (09/30 0519) Resp:  [15-20] 20 (09/30 0519) BP: (128-151)/(72-98) 134/82 (09/30 0519) SpO2:  [93 %-99 %] 93 % (09/30 0519) Weight:  [70 kg-71.2 kg] 70 kg (09/30 0500) Last BM Date: 12/23/18 General:   Alert,  Well-developed, well-nourished, elderly white male pleasant and cooperative in NAD.  He was sleeping, when awakened and started talking immediately developed hiccups Head:  Normocephalic and atraumatic. Eyes:  Sclera clear, no icterus.   Conjunctiva pink. Ears:  Normal auditory acuity. Nose:  No deformity, discharge,  or lesions. Mouth:  No deformity or lesions.   Neck:  Supple; no masses or thyromegaly. Lungs:  Clear throughout to auscultation.   No wheezes, crackles, or rhonchi.  Heart:  Regular rate and rhythm; no murmurs, clicks, rubs,  or gallops. Abdomen:  Soft, he is tender in the left upper quadrant with fullness, no definite palpable mass, abdomen is nondistended bowel sounds are present Rectal:  Deferred  Msk:  Symmetrical  without gross deformities. . Pulses:  Normal pulses noted. Extremities:  Without clubbing or edema. Neurologic:  Alert and  oriented x4;  grossly normal neurologically. Skin:  Intact without significant lesions or rashes.. Psych:  Alert and cooperative. Normal mood and affect.  Intake/Output from previous day: 09/29 0701 - 09/30 0700 In: 1347.5 [I.V.:347.5; IV Piggyback:1000] Out: 550 [Urine:550] Intake/Output this  shift: No intake/output data recorded.  Lab Results: Recent Labs    12/23/18 1516 12/24/18 0658  WBC 13.8* 24.2*  HGB 12.0* 11.0*  HCT 36.9* 33.1*  PLT 369 369   BMET Recent Labs    12/23/18 1516 12/24/18 0658  NA 136 137  K 3.7 3.6  CL 100 104  CO2 23 22  GLUCOSE 125* 132*  BUN 29* 36*  CREATININE 2.19* 1.93*  CALCIUM 9.2 8.0*   LFT Recent Labs    12/24/18 0658  PROT 5.4*  ALBUMIN 2.9*  AST 19  ALT 15  ALKPHOS 76  BILITOT 1.1   PT/INR No results for input(s): LABPROT, INR in the last 72 hours. Hepatitis Panel No results for input(s): HEPBSAG, HCVAB, HEPAIGM, HEPBIGM in the last 72 hours.    IMPRESSION:  #38 85 year old white male with recent finding of hypercalcemia, now with new onset of left upper abdominal pain, nausea, hiccups over the past 3 to 4 days. He was seen by oncology yesterday as an outpatient with concerns for occult malignancy and was sent to the emergency room for evaluation due to abdominal pain. CT imaging shows a large splenic mass along the splenic hilum, abdominal and retroperitoneal adenopathy, small amount of ascites, left hydronephrosis and left renal stones.  Comment is made of a large amount of stool in the colon with a transition zone, but nondilated bowel.  Enlarged spleen may be extrinsically compressing the colon.  Suspect the splenic lesion is also irritating the diaphragm causing hiccups.  He also had abnormal chest CT 2 weeks ago with scheduled nodular densities bilaterally and small pleural effusions.  Patient almost certainly has a metastatic malignancy.  Question lymphoma or other known GI primary.  #2 renal insufficiency, question acute on chronic #3 history of nephrolithiasis-left hydronephrosis noted on CT scan, likely secondary to intra-abdominal malignancy  PLAN: #1 Oncology needs to be reinvolved, known to Dr. Irene Limbo -to help guide diagnostic work-up. Generally PET scans cannot be done on an inpatient basis.  Splenic lesion high risk for bleeding with biopsy, question if thoracentesis would be best option for sampling.  #2 we will order around-the-clock Zofran, PPI, and Thorazine has been ordered though patient says not working.  Baclofen has relative contraindication with renal insufficiency due to concerns for toxicity Full liquid diet.    Amy EsterwoodPA-C  12/24/2018, 12:34 PM      [facility] Attending   I have taken an interval history, reviewed the chart and examined the patient. I agree with the Advanced Practitioner's note, impression and recommendations.   Spoke to son and patient and Dr. Posey Pronto  Agree w/ above but would also have urology see him and evaluate/treat left hydronephrosis  Gatha Mayer, MD, [facility] 12/24/2018 5:19 PM Pager [phone]

## 2018-12-24 NOTE — Progress Notes (Signed)
When bringing smog enema to give to patient, patient requested to use the restroom.  Patient was able to have very large loose black bowel movement.

## 2018-12-24 NOTE — Telephone Encounter (Signed)
Scheduled appt per 9/29 los.  Patient was admitted into the ER...did not schedule an lab.  Schedule phone visit in 2 weeks. Left a voice message of appt date and time,

## 2018-12-24 NOTE — Telephone Encounter (Signed)
Patient is requesting to transfer care from Dr Ethelene Hal at [facility] to Dr Quay Burow at [facility]. See message below.  Dr Ethelene Hal, is this okay with you? Dr Quay Burow, is this okay with you?

## 2018-12-24 NOTE — Telephone Encounter (Signed)
Copied from [place] [phone]. Topic: General - Other >> Dec 24, 2018  9:21 AM Keene Breath wrote: Reason for CRM: Patient's granddaughter called to as that the patient be switched to Dr. Quay Burow, which she said the patient had spoken with the doctor about this before.  Please call Erin at [phone] to confirm.

## 2018-12-24 NOTE — Progress Notes (Signed)
[facility] Progress Note  Patient: Ricardo Sloan [id number]   PCP: Libby Maw, MD DOB: 1931/09/27   DOA: 12/23/2018   DOS: 12/24/2018   Date of Service: the patient was seen and examined on 12/24/2018  Chief Complaint  Patient presents with  . Abdominal Pain   Brief hospital course: Desmone Diersen  is a 85 y.o. male, w hypertension, hyperlipidemia, Gerd Bph, h/o microscopic hematuria, nephrolithiasis, hx of RUL spiculated nodule on CT chest, Kappa Light chain disease,  w recent admission for hypercalcemia, presents with c/o abdominal pain, LUQ/ epigastric for the past 2-3 days.  Associated with n/v.  Pt denies fever, chills, cough, cp, palp, sob, diarrhea, brbpr, black stool.  Last BM yesterday  Currently further plan is to treat constipation, monitor renal function.  Concern for hydronephrosis.  Concern for malignancy.  Subjective: Reports severe hiccups.  Nausea is also present but no vomiting.  Passing gas.  No fever no chills.  No chest pain.  Left-sided abdominal pain reported.  Assessment and Plan: Abdominal pain secondary to constipation, obstruction, splenic mass Initially was n.p.o.  Appreciate GI assistance. Currently on liquid diet. Patient had a large BM. X-ray still shows evidence of's constipation. Continue stool softener. Continue pain control.  Nausea/ vomitting Hiccups Zofran 4mg  iv q6h prn  Thorazine.  Splenic mass Splenic masses not a good option for biopsy given high risk for bleeding. We will discuss with oncology tomorrow regarding further option for work-up.  Hypertension Cont Amlodipine 5mg  po qday (note this medication can cause constipation) Cont Metoprolol XL  25mg  po qday  Hyperlipidemia Cont Pravastatin 20mg  po qhs  Gerd Cont PPI  H/o Hypercalcemia, Kappa Light Chain disease F/u as outpatient  Diet: Clear liquid diet  DVT Prophylaxis: Subcutaneous Heparin   Advance goals of care discussion: Full code  Family  Communication: family was present at bedside, at the time of interview. The pt provided permission to discuss medical plan with the family. Opportunity was given to ask question and all questions were answered satisfactorily.   Disposition:  Discharge to be determined .  Consultants: [place] [facility] Dr. Dr. Arelia Longest Procedures: none  Scheduled Meds: . amLODipine  5 mg Oral Daily  . aspirin EC  81 mg Oral Daily  . enoxaparin (LOVENOX) injection  30 mg Subcutaneous Daily  . loratadine  10 mg Oral Daily  . metoprolol succinate  25 mg Oral Daily  . ondansetron (ZOFRAN) IV  4 mg Intravenous Q8H  . pantoprazole  40 mg Oral Daily  . pravastatin  20 mg Oral QHS  . simethicone  40 mg Oral QID  . sorbitol, milk of mag, mineral oil, glycerin (SMOG) enema  960 mL Rectal Once   Continuous Infusions: . chlorproMAZINE (THORAZINE) IV 12.5 mg (12/24/18 1632)   PRN Meds: acetaminophen **OR** acetaminophen, chlorproMAZINE (THORAZINE) IV, morphine injection Antibiotics: Anti-infectives (From admission, onward)   None       Objective: Physical Exam: Vitals:   12/24/18 0500 12/24/18 0519 12/24/18 1400 12/24/18 1503  BP:  134/82 129/79 126/79  Pulse:  (!) 103 (!) 113 (!) 103  Resp:  20 20 18   Temp:  99.7 F (37.6 C) 98.4 F (36.9 C) 98.4 F (36.9 C)  TempSrc:  Oral    SpO2:  93% 99% 95%  Weight: 70 kg     Height:        Intake/Output Summary (Last 24 hours) at 12/24/2018 1817 Last data filed at 12/24/2018 1500 Gross per 24 hour  Intake 1347.53 ml  Output 850 ml  Net 497.53 ml   Filed Weights   12/23/18 2303 12/24/18 0500  Weight: 70 kg 70 kg   General: alert and oriented to time, place, and person. Appear in mild distress, affect appropriate Eyes: PERRL, Conjunctiva normal ENT: Oral Mucosa Clear, moist  Neck: no JVD, no Abnormal Mass Or lumps Cardiovascular: S1 and S2 Present, no Murmur, peripheral pulses symmetrical Respiratory: good respiratory effort, Bilateral Air  entry equal and Decreased, no signs of accessory muscle use, Clear to Auscultation, no Crackles, no wheezes Abdomen: Bowel Sound present, Soft and diffuse tenderness, no hernia Skin: no rashes  Extremities: no Pedal edema, no calf tenderness Neurologic: without any new focal findings Gait not checked due to patient safety concerns  Data Reviewed: I have personally reviewed and interpreted daily labs, tele strips, imagings as discussed above. I reviewed all nursing notes, pharmacy notes, vitals, pertinent old records I have discussed plan of care as described above with RN and patient/family.  CBC: Recent Labs  Lab 12/18/18 1439 12/23/18 1516 12/24/18 0658  WBC 6.5 13.8* 24.2*  NEUTROABS  --  12.2*  --   HGB 11.0* 12.0* 11.0*  HCT 32.7* 36.9* 33.1*  MCV 90.4 92.5 92.2  PLT 301.0 369 0000000   Basic Metabolic Panel: Recent Labs  Lab 12/18/18 1439 12/23/18 1516 12/24/18 0658  NA 140 136 137  K 3.6 3.7 3.6  CL 106 100 104  CO2 25 23 22   GLUCOSE 147* 125* 132*  BUN 27* 29* 36*  CREATININE 1.85* 2.19* 1.93*  CALCIUM 10.4 9.2 8.0*    Liver Function Tests: Recent Labs  Lab 12/23/18 1516 12/24/18 0658  AST 25 19  ALT 19 15  ALKPHOS 93 76  BILITOT 1.1 1.1  PROT 6.6 5.4*  ALBUMIN 3.6 2.9*   Recent Labs  Lab 12/23/18 1516  LIPASE 45   No results for input(s): AMMONIA in the last 168 hours. Coagulation Profile: No results for input(s): INR, PROTIME in the last 168 hours. Cardiac Enzymes: No results for input(s): CKTOTAL, CKMB, CKMBINDEX, TROPONINI in the last 168 hours. BNP (last 3 results) Recent Labs    12/18/18 1439  PROBNP 228.0*   CBG: No results for input(s): GLUCAP in the last 168 hours. Studies: Ct Abdomen Pelvis Wo Contrast  Result Date: 12/23/2018 CLINICAL DATA:  85 year old male with acute abdominal and pelvic pain with distension. EXAM: CT ABDOMEN AND PELVIS WITHOUT CONTRAST TECHNIQUE: Multidetector CT imaging of the abdomen and pelvis was  performed following the standard protocol without IV contrast. COMPARISON:  12/11/2018 FINDINGS: Please note that parenchymal abnormalities may be missed without intravenous contrast. Lower chest: Small bilateral pleural effusions and mild bibasilar atelectasis again noted. Hepatobiliary: The liver and gallbladder are unremarkable except for a stable hepatic cyst. No biliary dilatation. Pancreas: No definite abnormality Spleen: Heterogeneous enlarged spleen noted with irregular 10 cm hypoechoic area along the splenic hilum is worrisome for malignancy. Adrenals/Urinary Tract: Bilateral renal atrophy, moderate LEFT hydronephrosis and LEFT renal calculi again noted. The adrenal glands are unremarkable. Bladder is unremarkable. Stomach/Bowel: A very large amount of stool is noted within the proximal and transverse colon. There is a transition point at the splenic flexure at the irregular splenic mass with collapsed descending and sigmoid colon. No other definite bowel abnormalities are noted. Vascular/Lymphatic: Aortic atherosclerosis. Enlarged retroperitoneal/iliac lymph nodes are noted with the largest lymph node measuring 3.2 cm (series 2: Image 20). Reproductive: Mild prostate enlargement noted. Other: A small amount of ascites within the abdomen and  pelvis noted. No pneumoperitoneum. Musculoskeletal: No acute or suspicious bony abnormalities are noted. Multilevel degenerative disc disease, spondylosis and facet arthropathy within the lumbar spine noted. IMPRESSION: 1. 10 cm irregular mass centered in the medial spleen with abdominal/retroperitoneal adenopathy highly suspicious for malignancy with metastatic spread. Small amount of ascites. 2. Very large amount of stool within the proximal transverse colon with relative colonic obstruction at the splenic flexure likely from the irregular splenic mass. No pneumoperitoneum. 3. Moderate LEFT hydronephrosis and LEFT renal calculi again noted. 4. Unchanged small  bilateral pleural effusions and bibasilar atelectasis. 5.  Aortic Atherosclerosis (ICD10-I70.0). Electronically Signed   By: Margarette Canada M.D.   On: 12/23/2018 20:05     Time spent: 35 minutes  Author: Berle Mull, MD [facility] 12/24/2018 6:17 PM  To reach On-call, see care teams to locate the attending and reach out to them via [email or web address]. If 7PM-7AM, please contact night-coverage If you still have difficulty reaching the attending provider, please page the [facility] (Director on Call) for [facility] on amion for assistance.

## 2018-12-25 ENCOUNTER — Other Ambulatory Visit: Payer: Self-pay

## 2018-12-25 ENCOUNTER — Inpatient Hospital Stay (HOSPITAL_COMMUNITY): Payer: Medicare HMO

## 2018-12-25 DIAGNOSIS — R591 Generalized enlarged lymph nodes: Secondary | ICD-10-CM

## 2018-12-25 DIAGNOSIS — R918 Other nonspecific abnormal finding of lung field: Secondary | ICD-10-CM

## 2018-12-25 LAB — COMPREHENSIVE METABOLIC PANEL
ALT: 14 U/L (ref 0–44)
AST: 18 U/L (ref 15–41)
Albumin: 2.6 g/dL — ABNORMAL LOW (ref 3.5–5.0)
Alkaline Phosphatase: 65 U/L (ref 38–126)
Anion gap: 8 (ref 5–15)
BUN: 45 mg/dL — ABNORMAL HIGH (ref 8–23)
CO2: 23 mmol/L (ref 22–32)
Calcium: 7.3 mg/dL — ABNORMAL LOW (ref 8.9–10.3)
Chloride: 104 mmol/L (ref 98–111)
Creatinine, Ser: 1.88 mg/dL — ABNORMAL HIGH (ref 0.61–1.24)
GFR calc Af Amer: 37 mL/min — ABNORMAL LOW (ref >60)
GFR calc non Af Amer: 32 mL/min — ABNORMAL LOW (ref >60)
Glucose, Bld: 108 mg/dL — ABNORMAL HIGH (ref 70–99)
Potassium: 3.2 mmol/L — ABNORMAL LOW (ref 3.5–5.1)
Sodium: 135 mmol/L (ref 135–145)
Total Bilirubin: 1 mg/dL (ref 0.3–1.2)
Total Protein: 4.8 g/dL — ABNORMAL LOW (ref 6.5–8.1)

## 2018-12-25 LAB — CBC WITH DIFFERENTIAL/PLATELET
Abs Immature Granulocytes: 0.13 10*3/uL — ABNORMAL HIGH (ref 0.00–0.07)
Basophils Absolute: 0 10*3/uL (ref 0.0–0.1)
Basophils Relative: 0 %
Eosinophils Absolute: 0 10*3/uL (ref 0.0–0.5)
Eosinophils Relative: 0 %
HCT: 28.8 % — ABNORMAL LOW (ref 39.0–52.0)
Hemoglobin: 9.5 g/dL — ABNORMAL LOW (ref 13.0–17.0)
Immature Granulocytes: 1 %
Lymphocytes Relative: 5 %
Lymphs Abs: 0.7 10*3/uL (ref 0.7–4.0)
MCH: 30.4 pg (ref 26.0–34.0)
MCHC: 33 g/dL (ref 30.0–36.0)
MCV: 92.3 fL (ref 80.0–100.0)
Monocytes Absolute: 1.1 10*3/uL — ABNORMAL HIGH (ref 0.1–1.0)
Monocytes Relative: 7 %
Neutro Abs: 13.4 10*3/uL — ABNORMAL HIGH (ref 1.7–7.7)
Neutrophils Relative %: 87 %
Platelets: 282 10*3/uL (ref 150–400)
RBC: 3.12 MIL/uL — ABNORMAL LOW (ref 4.22–5.81)
RDW: 14.7 % (ref 11.5–15.5)
WBC: 15.5 10*3/uL — ABNORMAL HIGH (ref 4.0–10.5)
nRBC: 0 % (ref 0.0–0.2)

## 2018-12-25 LAB — MAGNESIUM: Magnesium: 2.2 mg/dL (ref 1.7–2.4)

## 2018-12-25 MED ORDER — SODIUM CHLORIDE 0.9 % IV BOLUS
500.0000 mL | Freq: Once | INTRAVENOUS | Status: AC
  Administered 2018-12-25: 500 mL via INTRAVENOUS

## 2018-12-25 MED ORDER — DILTIAZEM HCL 25 MG/5ML IV SOLN
10.0000 mg | Freq: Once | INTRAVENOUS | Status: AC
  Administered 2018-12-25: 10 mg via INTRAVENOUS
  Filled 2018-12-25: qty 5

## 2018-12-25 MED ORDER — DIGOXIN 0.25 MG/ML IJ SOLN
0.5000 mg | Freq: Once | INTRAMUSCULAR | Status: AC
  Administered 2018-12-25: 01:00:00 0.5 mg via INTRAVENOUS
  Filled 2018-12-25: qty 2

## 2018-12-25 MED ORDER — POLYETHYLENE GLYCOL 3350 17 G PO PACK
17.0000 g | PACK | Freq: Every day | ORAL | Status: DC
  Administered 2018-12-25 – 2018-12-26 (×2): 17 g via ORAL
  Filled 2018-12-25 (×2): qty 1

## 2018-12-25 MED ORDER — METOPROLOL TARTRATE 5 MG/5ML IV SOLN
INTRAVENOUS | Status: AC
  Filled 2018-12-25: qty 5

## 2018-12-25 MED ORDER — POTASSIUM CHLORIDE CRYS ER 20 MEQ PO TBCR
40.0000 meq | EXTENDED_RELEASE_TABLET | Freq: Once | ORAL | Status: AC
  Administered 2018-12-25: 40 meq via ORAL
  Filled 2018-12-25: qty 2

## 2018-12-25 MED ORDER — METOPROLOL TARTRATE 5 MG/5ML IV SOLN
5.0000 mg | Freq: Once | INTRAVENOUS | Status: AC
  Administered 2018-12-25: 5 mg via INTRAVENOUS

## 2018-12-25 MED ORDER — DOCUSATE SODIUM 100 MG PO CAPS
100.0000 mg | ORAL_CAPSULE | Freq: Two times a day (BID) | ORAL | Status: DC
  Administered 2018-12-25 – 2018-12-26 (×2): 100 mg via ORAL
  Filled 2018-12-25 (×3): qty 1

## 2018-12-25 MED ORDER — SODIUM CHLORIDE 0.9 % IV BOLUS
250.0000 mL | Freq: Once | INTRAVENOUS | Status: AC
  Administered 2018-12-25: 250 mL via INTRAVENOUS

## 2018-12-25 NOTE — Progress Notes (Signed)
Patient ID: Ricardo Sloan, male   DOB: 1932-01-09, 85 y.o.   MRN: [id number] Request received for consideration of lymph node biopsy on patient.  Imaging studies were reviewed by Dr. Kathlene Cote.   Based on his review there is no safe area to biopsy at this time.  Patient has a left retroperitoneal node but also hydronephrosis and in close proximity to the left ureter/UPJ/renal vein.  Recommend outpatient PET scan. Other options to consider may be bone marrow biopsy next week versus laparoscopic lymph node sampling. Dr. Kathlene Cote can be reached at [phone] with any additional questions.

## 2018-12-25 NOTE — Progress Notes (Addendum)
HEMATOLOGY-ONCOLOGY PROGRESS NOTE  SUBJECTIVE: The patient was seen at the cancer center on 12/24/2018 and was sent to the emergency room due to left upper quadrant abdominal pain associated with nausea and vomiting.  The patient was started on pain medication and antiemetics.  When seen today, he reports that he feels better.  He still has some mild discomfort in the abdomen.  He is not complaining of any nausea or vomiting this morning.  Had an episode of A. fib with RVR during the night.  His heart rate was between 135-155.  He was given IV Lopressor, IV digoxin, and IV diltiazem with improvement of his heart rate.  REVIEW OF SYSTEMS:   Review of systems was negative except as noted in the HPI.  I have reviewed the past medical history, past surgical history, social history and family history with the patient and they are unchanged from previous note.   PHYSICAL EXAMINATION: ECOG PERFORMANCE STATUS: 2 - Symptomatic, <50% confined to bed  Vitals:   12/25/18 0459 12/25/18 0900  BP: 104/68   Pulse: 90   Resp: (!) 21 18  Temp: 98.2 F (36.8 C)   SpO2: 95%    Filed Weights   12/23/18 2303 12/24/18 0500 12/25/18 0459  Weight: 154 lb 5.2 oz (70 kg) 154 lb 5.2 oz (70 kg) 152 lb 1.9 oz (69 kg)    Intake/Output from previous day: 09/30 0701 - 10/01 0700 In: 1025 [IV Piggyback:1025] Out: 300 [Urine:300]  GENERAL:alert, no distress and comfortable SKIN: skin color, texture, turgor are normal, no rashes or significant lesions EYES: normal, Conjunctiva are pink and non-injected, sclera clear OROPHARYNX:no exudate, no erythema and lips, buccal mucosa, and tongue normal  NECK: supple, thyroid normal size, non-tender, without nodularity LYMPH:  no palpable lymphadenopathy in the cervical, axillary or inguinal LUNGS: clear to auscultation and percussion with normal breathing effort HEART: regular rate & rhythm and no murmurs and no lower extremity edema ABDOMEN: Positive bowel sounds, abdomen  soft.  Reports diffuse tenderness particularly over the left upper quadrant. Musculoskeletal:no cyanosis of digits and no clubbing  NEURO: alert & oriented x 3 with fluent speech, no focal motor/sensory deficits  LABORATORY DATA:  I have reviewed the data as listed CMP Latest Ref Rng & Units 12/25/2018 12/24/2018 12/23/2018  Glucose 70 - 99 mg/dL 108(H) 132(H) 125(H)  BUN 8 - 23 mg/dL 45(H) 36(H) 29(H)  Creatinine 0.61 - 1.24 mg/dL 1.88(H) 1.93(H) 2.19(H)  Sodium 135 - 145 mmol/L 135 137 136  Potassium 3.5 - 5.1 mmol/L 3.2(L) 3.6 3.7  Chloride 98 - 111 mmol/L 104 104 100  CO2 22 - 32 mmol/L 23 22 23   Calcium 8.9 - 10.3 mg/dL 7.3(L) 8.0(L) 9.2  Total Protein 6.5 - 8.1 g/dL 4.8(L) 5.4(L) 6.6  Total Bilirubin 0.3 - 1.2 mg/dL 1.0 1.1 1.1  Alkaline Phos 38 - 126 U/L 65 76 93  AST 15 - 41 U/L 18 19 25   ALT 0 - 44 U/L 14 15 19     Lab Results  Component Value Date   WBC 15.5 (H) 12/25/2018   HGB 9.5 (L) 12/25/2018   HCT 28.8 (L) 12/25/2018   MCV 92.3 12/25/2018   PLT 282 12/25/2018   NEUTROABS 13.4 (H) 12/25/2018    Ct Abdomen Pelvis Wo Contrast  Result Date: 12/23/2018 CLINICAL DATA:  85 year old male with acute abdominal and pelvic pain with distension. EXAM: CT ABDOMEN AND PELVIS WITHOUT CONTRAST TECHNIQUE: Multidetector CT imaging of the abdomen and pelvis was performed following the standard  protocol without IV contrast. COMPARISON:  12/11/2018 FINDINGS: Please note that parenchymal abnormalities may be missed without intravenous contrast. Lower chest: Small bilateral pleural effusions and mild bibasilar atelectasis again noted. Hepatobiliary: The liver and gallbladder are unremarkable except for a stable hepatic cyst. No biliary dilatation. Pancreas: No definite abnormality Spleen: Heterogeneous enlarged spleen noted with irregular 10 cm hypoechoic area along the splenic hilum is worrisome for malignancy. Adrenals/Urinary Tract: Bilateral renal atrophy, moderate LEFT hydronephrosis and  LEFT renal calculi again noted. The adrenal glands are unremarkable. Bladder is unremarkable. Stomach/Bowel: A very large amount of stool is noted within the proximal and transverse colon. There is a transition point at the splenic flexure at the irregular splenic mass with collapsed descending and sigmoid colon. No other definite bowel abnormalities are noted. Vascular/Lymphatic: Aortic atherosclerosis. Enlarged retroperitoneal/iliac lymph nodes are noted with the largest lymph node measuring 3.2 cm (series 2: Image 20). Reproductive: Mild prostate enlargement noted. Other: A small amount of ascites within the abdomen and pelvis noted. No pneumoperitoneum. Musculoskeletal: No acute or suspicious bony abnormalities are noted. Multilevel degenerative disc disease, spondylosis and facet arthropathy within the lumbar spine noted. IMPRESSION: 1. 10 cm irregular mass centered in the medial spleen with abdominal/retroperitoneal adenopathy highly suspicious for malignancy with metastatic spread. Small amount of ascites. 2. Very large amount of stool within the proximal transverse colon with relative colonic obstruction at the splenic flexure likely from the irregular splenic mass. No pneumoperitoneum. 3. Moderate LEFT hydronephrosis and LEFT renal calculi again noted. 4. Unchanged small bilateral pleural effusions and bibasilar atelectasis. 5.  Aortic Atherosclerosis (ICD10-I70.0). Electronically Signed   By: Margarette Canada M.D.   On: 12/23/2018 20:05   Ct Abdomen Pelvis Wo Contrast  Result Date: 12/11/2018 CLINICAL DATA:  Known left-sided hydronephrosis and chronic renal disease EXAM: CT CHEST, ABDOMEN AND PELVIS WITHOUT CONTRAST TECHNIQUE: Multidetector CT imaging of the chest, abdomen and pelvis was performed following the standard protocol without IV contrast. COMPARISON:  04/10/2017 FINDINGS: CT CHEST FINDINGS Cardiovascular: Thoracic aorta demonstrates atherosclerotic calcifications without aneurysmal dilatation.  Coronary calcifications are noted. No cardiac enlargement is seen. Mediastinum/Nodes: Thoracic inlet is within normal limits. No hilar or mediastinal adenopathy is noted. The esophagus as visualized is within normal limits. Lungs/Pleura: Lungs are well aerated bilaterally with mild emphysematous changes. Small bilateral pleural effusions are seen left greater than right. Mild left lower lobe atelectasis is noted. Scarring in the right middle lobe and lingula on the left are again seen and stable. Multiple nodules are noted throughout both lungs increased in appearance when compared with the prior exam. This is most notable in the right upper lobe where a 10 mm nodule is noted best seen on image number 57 of series 4. Scattered smaller nodules are seen. Previously noted ground-glass attenuation area in the posterior right upper lobe has improved but demonstrates some residual nodular changes. The remainder of the nodular densities measure less than 5 mm. Musculoskeletal: Degenerative changes of the thoracic spine are noted. No lytic or sclerotic lesions are seen. Extrapleural soft tissue densities are noted along the left lateral aspect of the sternum stable in appearance from the prior exam from 2019 again likely benign in etiology. CT ABDOMEN PELVIS FINDINGS Hepatobiliary: Somewhat limited due to lack of IV contrast. Stable hepatic cyst is noted. The gallbladder is within normal limits. Pancreas: Atrophic changes of the pancreas are seen. Spleen: Spleen is enlarged in size. Evaluation is somewhat limited due to lack of IV contrast. A mixed attenuation lesion is identified  which measures at least 10 cm in greatest dimension. This would correspond with that seen on prior ultrasound examination. In retrospect, this was likely present on the prior exam of 2019 but again incompletely evaluated because of lack of IV contrast. Previously it measured just over 5 cm. Adrenals/Urinary Tract: Adrenal glands are within normal  limits. The right kidney demonstrates no focal abnormality. The left kidney demonstrates some renal calculi. Left-sided hydronephrosis is seen although the ureter appears to be within normal limits. No ureteral calculi are noted. The bladder is well distended. Stomach/Bowel: The appendix is within normal limits. Colon is filled with fecal material although no obstructive changes are seen. Small bowel and stomach appear within normal limits. Vascular/Lymphatic: Aortic atherosclerosis. No enlarged abdominal or pelvic lymph nodes. Reproductive: Prostate is unremarkable. Other: Free fluid is noted within the pelvis. Free fluid also surrounds the liver. Musculoskeletal: Degenerative changes of the lumbar spine are seen. No lytic or sclerotic lesions are noted. IMPRESSION: CT of the chest: Scattered nodular densities bilaterally which have increased in the interval from the prior exam particularly in the right upper lobe where a 10 mm mildly spiculated nodule is seen as described. Small pleural effusions are noted as well. Outpatient PET-CT may be helpful for further evaluation. Soft tissue densities are noted along the left lateral aspect of the sternum stable in appearance from the prior exam and likely benign in etiology. CT of the abdomen and pelvis: Mixed attenuation mass lesion within the spleen as described. This is larger than seen on prior examination and may simply represent an enlarged hemangioma. The evaluation is somewhat limited due to lack of IV contrast. More aggressive process could not be totally excluded. Left renal calculi with evidence of left-sided hydronephrosis. This is new from the prior exam but similar to that seen on recent ultrasound. No obstructing lesion or stone is noted. Mild ascites. Electronically Signed   By: Inez Catalina M.D.   On: 12/11/2018 19:52   Dg Ribs Unilateral W/chest Right  Result Date: 12/10/2018 CLINICAL DATA:  Chest pain. EXAM: RIGHT RIBS AND CHEST - 3+ VIEW  COMPARISON:  Radiographs of December 08, 2018. FINDINGS: No fracture or other bone lesions are seen involving the ribs. There is no evidence of pneumothorax or pleural effusion. As noted on recent x-ray, nodular density is seen in right upper lobe. Heart size and mediastinal contours are within normal limits. IMPRESSION: Normal right ribs. No pneumothorax or pleural effusion is noted. As noted on recent x-ray, nodular density is noted in right upper lobe, and CT scan of the chest is recommended to rule out pulmonary nodule or mass. Electronically Signed   By: Marijo Conception M.D.   On: 12/10/2018 14:54   Ct Chest Wo Contrast  Result Date: 12/11/2018 CLINICAL DATA:  Known left-sided hydronephrosis and chronic renal disease EXAM: CT CHEST, ABDOMEN AND PELVIS WITHOUT CONTRAST TECHNIQUE: Multidetector CT imaging of the chest, abdomen and pelvis was performed following the standard protocol without IV contrast. COMPARISON:  04/10/2017 FINDINGS: CT CHEST FINDINGS Cardiovascular: Thoracic aorta demonstrates atherosclerotic calcifications without aneurysmal dilatation. Coronary calcifications are noted. No cardiac enlargement is seen. Mediastinum/Nodes: Thoracic inlet is within normal limits. No hilar or mediastinal adenopathy is noted. The esophagus as visualized is within normal limits. Lungs/Pleura: Lungs are well aerated bilaterally with mild emphysematous changes. Small bilateral pleural effusions are seen left greater than right. Mild left lower lobe atelectasis is noted. Scarring in the right middle lobe and lingula on the left are again seen  and stable. Multiple nodules are noted throughout both lungs increased in appearance when compared with the prior exam. This is most notable in the right upper lobe where a 10 mm nodule is noted best seen on image number 57 of series 4. Scattered smaller nodules are seen. Previously noted ground-glass attenuation area in the posterior right upper lobe has improved but  demonstrates some residual nodular changes. The remainder of the nodular densities measure less than 5 mm. Musculoskeletal: Degenerative changes of the thoracic spine are noted. No lytic or sclerotic lesions are seen. Extrapleural soft tissue densities are noted along the left lateral aspect of the sternum stable in appearance from the prior exam from 2019 again likely benign in etiology. CT ABDOMEN PELVIS FINDINGS Hepatobiliary: Somewhat limited due to lack of IV contrast. Stable hepatic cyst is noted. The gallbladder is within normal limits. Pancreas: Atrophic changes of the pancreas are seen. Spleen: Spleen is enlarged in size. Evaluation is somewhat limited due to lack of IV contrast. A mixed attenuation lesion is identified which measures at least 10 cm in greatest dimension. This would correspond with that seen on prior ultrasound examination. In retrospect, this was likely present on the prior exam of 2019 but again incompletely evaluated because of lack of IV contrast. Previously it measured just over 5 cm. Adrenals/Urinary Tract: Adrenal glands are within normal limits. The right kidney demonstrates no focal abnormality. The left kidney demonstrates some renal calculi. Left-sided hydronephrosis is seen although the ureter appears to be within normal limits. No ureteral calculi are noted. The bladder is well distended. Stomach/Bowel: The appendix is within normal limits. Colon is filled with fecal material although no obstructive changes are seen. Small bowel and stomach appear within normal limits. Vascular/Lymphatic: Aortic atherosclerosis. No enlarged abdominal or pelvic lymph nodes. Reproductive: Prostate is unremarkable. Other: Free fluid is noted within the pelvis. Free fluid also surrounds the liver. Musculoskeletal: Degenerative changes of the lumbar spine are seen. No lytic or sclerotic lesions are noted. IMPRESSION: CT of the chest: Scattered nodular densities bilaterally which have increased in  the interval from the prior exam particularly in the right upper lobe where a 10 mm mildly spiculated nodule is seen as described. Small pleural effusions are noted as well. Outpatient PET-CT may be helpful for further evaluation. Soft tissue densities are noted along the left lateral aspect of the sternum stable in appearance from the prior exam and likely benign in etiology. CT of the abdomen and pelvis: Mixed attenuation mass lesion within the spleen as described. This is larger than seen on prior examination and may simply represent an enlarged hemangioma. The evaluation is somewhat limited due to lack of IV contrast. More aggressive process could not be totally excluded. Left renal calculi with evidence of left-sided hydronephrosis. This is new from the prior exam but similar to that seen on recent ultrasound. No obstructing lesion or stone is noted. Mild ascites. Electronically Signed   By: Inez Catalina M.D.   On: 12/11/2018 19:52   Nm Bone Scan Whole Body  Result Date: 12/12/2018 CLINICAL DATA:  Abdominal neoplasm. Concern for MM. Current pain right clavicle. EXAM: NUCLEAR MEDICINE WHOLE BODY BONE SCAN TECHNIQUE: Whole body anterior and posterior images were obtained approximately 3 hours after intravenous injection of radiopharmaceutical. RADIOPHARMACEUTICALS:  21.2 mCi Technetium-60m MDP IV COMPARISON:  CT 12/11/2018 FINDINGS: Exam demonstrates 2 adjacent foci of increased radiotracer activity over the right anterior third and fourth rib ends likely posttraumatic. Small focus of increased tracer activity over the  soft tissues just above the left hip lateral to the iliac bone seen only on the anterior images without corresponding abnormality on CT as this may be artifactual. No focal abnormality over the right clavicle to explain patient's right clavicular pain. Minimal symmetric uptake over the shoulders, elbows, knees and ankles compatible with degenerative changes. Increased tracer activity over  dilated left intrarenal collecting system/renal pelvis as seen on CT. IMPRESSION: 1. No focal abnormality over the right clavicle to account for patient's pain. 2. Two adjacent foci of increased tracer activity over the right third and fourth anterior rib ends likely posttraumatic as metastatic disease is much less likely. 3.  Degenerative changes of the appendicular skeleton. 4.  Left hydronephrosis. Electronically Signed   By: Marin Olp M.D.   On: 12/12/2018 16:21   US Renal  Result Date: 12/25/2018 CLINICAL DATA:  Followup hydronephrosis EXAM: RENAL / URINARY TRACT ULTRASOUND COMPLETE COMPARISON:  CT 12/23/2018, ultrasound 12/10/2018 FINDINGS: Right Kidney: Renal measurements: 10.3 x 4.5 x 5.2 cm = volume: 127 mL . Echogenicity within normal limits. No mass or hydronephrosis visualized. Left Kidney: Renal measurements: 9.7 x 5.5 x 4.9 cm = volume: 136 mL. Echogenicity within normal limits. Moderate hydronephrosis. Scattered echogenic foci within the left kidney compatible with known renal calculi. Bladder: Bladder is mildly distended and appears within normal limits. Right ureteral jet was seen. No left ureteral jet was visualized during the course of the examination. Other: Small volume ascites. Irregular splenic mass, better characterized on recent CT. IMPRESSION: 1. Left-sided nephrolithiasis with moderate left hydronephrosis. No left-sided ureteral jet was seen. 2. Small volume ascites. 3. Partially visualized splenic mass. Electronically Signed   By: Davina Poke M.D.   On: 12/25/2018 11:34   US Renal  Result Date: 12/10/2018 CLINICAL DATA:  Acute renal injury with left flank pain EXAM: RENAL / URINARY TRACT ULTRASOUND COMPLETE COMPARISON:  None. FINDINGS: Right Kidney: Renal measurements: 11.9 x 4.3 x 4.7 cm. = volume: 125 mL. Diffuse increased echogenicity is noted. No mass lesion or hydronephrosis is noted. Left Kidney: Renal measurements: 13.6 x 6.2 x 7.6 cm. = volume: 429 mL. Moderate  hydronephrosis is seen. Mild increased echogenicity is noted. Bladder: Bladder is well distended. Bilateral pleural effusions and mild ascites is seen. Spleen is mildly prominent with some diffuse heterogeneity within the central portion suggestive of underlying mass. Contrast enhanced CT may be helpful. Alternatively MRI on a nonemergent basis could be performed. IMPRESSION: Left-sided hydronephrosis. Bilateral effusions and mild ascites. Increased echogenicity in the right kidney is noted consistent with medical renal disease. Mild increased echogenicity is noted on the left. Findings suspicious for a mass within the spleen. This may simply represent a large hemangioma. Contrast enhanced CT or MRI may be helpful for further evaluation. Electronically Signed   By: Inez Catalina M.D.   On: 12/10/2018 20:08   Dg Chest Port 1 View  Result Date: 12/08/2018 CLINICAL DATA:  Hypercalcemia workup, weakness EXAM: PORTABLE CHEST 1 VIEW COMPARISON:  04/25/2017, CT chest 04/10/2017 FINDINGS: No focal opacity or pleural effusion. Normal cardiomediastinal silhouette with aortic atherosclerosis. No pneumothorax. Possible small lung nodule in the right upper lobe. IMPRESSION: No active disease. Possible small lung nodule in the right upper lobe which may be evaluated with follow-up CT. Electronically Signed   By: Donavan Foil M.D.   On: 12/08/2018 23:08   Dg Abd Portable 1v  Result Date: 12/24/2018 CLINICAL DATA:  Constipation. EXAM: PORTABLE ABDOMEN - 1 VIEW COMPARISON:  CT yesterday, CT 12/11/2018 also  reviewed FINDINGS: Gaseous distension of ascending and transverse colon, similar to CT yesterday. Small volume of formed stool. More distal colon is collapsed and not well evaluated on radiograph. No evidence of free air on single supine view. No small bowel dilatation. Left renal calculi on prior CT are faintly visualized. IMPRESSION: Gaseous distension of the ascending and transverse colon, similar to CT yesterday,  question external compression from splenic lesion. Small volume of formed stool within the distended colon. No small bowel dilatation or small bowel obstruction. Electronically Signed   By: Keith Rake M.D.   On: 12/24/2018 19:35    ASSESSMENT AND PLAN: 1.  Occult malignancy with enlarged abdominal/retroperitoneal adenopathy and pulmonary lesions concerning for primary lung cancer versus possible lymphoma 2.  Elevated kappa free light chain with normal kappa, lambda light chain ratio 3.  History of hypercalcemia -?  Related to malignancy 4.  CKD 5.  Normocytic anemia related to CKD and ?  Underlying malignancy  -Will request CT-guided biopsy by interventional radiology.  We will ask them to review the scans to determine the best target for the biopsy.  Unsure if they can obtain a biopsy from the lymph node near the spleen versus biopsy of the lung lesion.  If they are unable to perform this, we may need to ask general surgery for possible laparoscopic biopsy versus asking pulmonology for possible bronchoscopy with biopsy of the lung lesion. -Continue pain medication and antiemetics per hospitalist. -Hypercalcemia has resolved.  Monitor calcium level closely. -CKD slowly improving with hydration.  IV fluids per hospitalist. -Hemoglobin stable.  No transfusion is indicated.  Consider blood transfusion for hemoglobin less than 8.   LOS: 1 day   Mikey Bussing, DNP, AGPCNP-BC, AOCNP 12/25/18   ADDENDUM  .Patient was Personally and independently interviewed, examined and relevant elements of the history of present illness were reviewed in details and an assessment and plan was created. All elements of the patient's history of present illness , assessment and plan were discussed in details with Mikey Bussing, DNP. The above documentation reflects our combined findings assessment and plan.  Sullivan Lone MD MS

## 2018-12-25 NOTE — Progress Notes (Signed)
   12/25/18 0036  Vitals  Temp 98.4 F (36.9 C)  Temp Source Oral  BP 92/70  BP Location Left Arm  BP Method Automatic  Patient Position (if appropriate) Lying  Pulse Rate (!) 144  Pulse Rate Source Dinamap  Resp (!) 24  Oxygen Therapy  SpO2 93 %  O2 Device Room Air  MEWS Score  MEWS RR 1  MEWS Pulse 3  MEWS Systolic 1  MEWS LOC 0  MEWS Temp 0  MEWS Score 5  MEWS Score Color Red  Mews initiated

## 2018-12-25 NOTE — Progress Notes (Signed)
    Chart reviewed  Will sign off - but available if ?'s    Gatha Mayer, MD, [facility] 12/25/2018 3:33 PM Pager [phone]

## 2018-12-25 NOTE — Telephone Encounter (Signed)
Okay 

## 2018-12-25 NOTE — Progress Notes (Signed)
Patients rate stabilized 80-90 at 0330. Patient resting quietly at present

## 2018-12-25 NOTE — Telephone Encounter (Signed)
Yes, I will accept him 

## 2018-12-25 NOTE — Significant Event (Addendum)
Rapid Response Event Note  Overview: Time Called: 0038 Arrival Time: 0045 Event Type: Cardiac  Initial Focused Assessment: Called to bedside for patient experiencing atrial fibrillation with RVR, heart rate 135-155 bpm. Patient lying in bed, appears comfortable, and is alert. Patient states that he takes medication for his heart and "sometimes it beats fast". Patient has no complaints of pain. BP 106/72.  Interventions: NP notified of patient's current HR and BP. NP made aware that patient is currently on a daily Lopressor regimen and I asked if we could give him an additional dose now of medication for rate control.   0100: 5mg  IV lopressor & 500cc bolus given 0125: 0.5 mg IV digoxin given 0156: 10 mg IV cardizem given 0228: 10 mg IV cardizem & 250 cc bolus given  Plan of Care (if not transferred): Patient to remain on continuous telemetry monitoring. RN to notify provider or call rapid response if patient's heart rate sustains greater than 100 bpm.   Event Summary: Name of Physician Notified: Lamar Blinks, NP at [phone]    at    Outcome: Stayed in room and stabalized     Casimer Bilis

## 2018-12-25 NOTE — Progress Notes (Signed)
[facility] Progress Note  Patient: Ricardo Sloan [id number]   PCP: Ricardo Maw, MD DOB: [birth year]   DOA: 12/23/2018   DOS: 12/25/2018   Date of Service: the patient was seen and examined on 12/25/2018  Chief Complaint  Patient presents with  . Abdominal Pain   Brief hospital course: Ricardo Sloan  is a 85 y.o. male, w hypertension, hyperlipidemia, Gerd Bph, h/o microscopic hematuria, nephrolithiasis, hx of RUL spiculated nodule on CT chest, Kappa Light chain disease,  w recent admission for hypercalcemia, presents with c/o abdominal pain, LUQ/ epigastric for the past 2-3 days.  Associated with n/v.  Pt denies fever, chills, cough, cp, palp, sob, diarrhea, brbpr, black stool.  Last BM yesterday  Currently further plan is to treat constipation, monitor renal function.  Concern for hydronephrosis.  Concern for malignancy.  Subjective: No further hiccups.  No nausea no vomiting.  No fever no chills.  Oral intake improving.  Assessment and Plan: Abdominal pain secondary to constipation, obstruction, splenic mass Initially was n.p.o.  Appreciate GI assistance. Currently on regular diet Patient had a large BM. X-ray still shows evidence of's constipation. Continue stool softener. Continue pain control.  Nausea/ vomitting Hiccups Zofran 4mg  iv q6h prn  Thorazine.  Splenic mass Splenic masses not a good option for biopsy given high risk for bleeding. Oncology consulted IR.  Oncology will consult general surgery.  Hypertension Cont Amlodipine 5mg  po qday (note this medication can cause constipation) Cont Metoprolol XL  25mg  po qday  Hyperlipidemia Cont Pravastatin 20mg  po qhs  Gerd Cont PPI  H/o Hypercalcemia, Kappa Light Chain disease F/u as outpatient  Diet: Clear liquid diet  DVT Prophylaxis: Subcutaneous Heparin   Advance goals of care discussion: Full code  Family Communication: family was present at bedside, at the time of interview. The  pt provided permission to discuss medical plan with the family. Opportunity was given to ask question and all questions were answered satisfactorily.   Disposition:  Discharge to be determined .  Consultants: [facility] Dr. Dr. Arelia Longest, oncology Procedures: none  Scheduled Meds: . aspirin EC  81 mg Oral Daily  . docusate sodium  100 mg Oral BID  . enoxaparin (LOVENOX) injection  30 mg Subcutaneous Daily  . loratadine  10 mg Oral Daily  . ondansetron (ZOFRAN) IV  4 mg Intravenous Q8H  . pantoprazole  40 mg Oral Daily  . polyethylene glycol  17 g Oral Daily  . pravastatin  20 mg Oral QHS  . simethicone  40 mg Oral QID   Continuous Infusions: . chlorproMAZINE (THORAZINE) IV 12.5 mg (12/24/18 1632)   PRN Meds: acetaminophen **OR** acetaminophen, chlorproMAZINE (THORAZINE) IV, morphine injection Antibiotics: Anti-infectives (From admission, onward)   None       Objective: Physical Exam: Vitals:   12/25/18 0900 12/25/18 1438 12/25/18 1800 12/25/18 2026  BP:  129/84  119/67  Pulse:  99 92 (!) 103  Resp: 18 20  20   Temp:  98.2 F (36.8 C)  98.7 F (37.1 C)  TempSrc:  Oral    SpO2:  96%  97%  Weight:  69.2 kg    Height:        Intake/Output Summary (Last 24 hours) at 12/25/2018 2037 Last data filed at 12/25/2018 1700 Gross per 24 hour  Intake 1440 ml  Output -  Net 1440 ml   Filed Weights   12/24/18 0500 12/25/18 0459 12/25/18 1438  Weight: 70 kg 69 kg 69.2 kg   General: alert and  oriented to time, place, and person. Appear in mild distress, affect appropriate Eyes: PERRL, Conjunctiva normal ENT: Oral Mucosa Clear, moist  Neck: no JVD, no Abnormal Mass Or lumps Cardiovascular: S1 and S2 Present, no Murmur, peripheral pulses symmetrical Respiratory: good respiratory effort, Bilateral Air entry equal and Decreased, no signs of accessory muscle use, Clear to Auscultation, no Crackles, no wheezes Abdomen: Bowel Sound present, Soft and diffuse tenderness,  no hernia Skin: no rashes  Extremities: no Pedal edema, no calf tenderness Neurologic: without any new focal findings Gait not checked due to patient safety concerns  Data Reviewed: I have personally reviewed and interpreted daily labs, tele strips, imagings as discussed above. I reviewed all nursing notes, pharmacy notes, vitals, pertinent old records I have discussed plan of care as described above with RN and patient/family.  CBC: Recent Labs  Lab 12/23/18 1516 12/24/18 0658 12/25/18 0535  WBC 13.8* 24.2* 15.5*  NEUTROABS 12.2*  --  13.4*  HGB 12.0* 11.0* 9.5*  HCT 36.9* 33.1* 28.8*  MCV 92.5 92.2 92.3  PLT 369 369 [id number]   Basic Metabolic Panel: Recent Labs  Lab 12/23/18 1516 12/24/18 0658 12/25/18 0535  NA 136 137 135  K 3.7 3.6 3.2*  CL 100 104 104  CO2 23 22 23   GLUCOSE 125* 132* 108*  BUN 29* 36* 45*  CREATININE 2.19* 1.93* 1.88*  CALCIUM 9.2 8.0* 7.3*  MG  --   --  2.2    Liver Function Tests: Recent Labs  Lab 12/23/18 1516 12/24/18 0658 12/25/18 0535  AST 25 19 18   ALT 19 15 14   ALKPHOS 93 76 65  BILITOT 1.1 1.1 1.0  PROT 6.6 5.4* 4.8*  ALBUMIN 3.6 2.9* 2.6*   Recent Labs  Lab 12/23/18 1516  LIPASE 45   No results for input(s): AMMONIA in the last 168 hours. Coagulation Profile: No results for input(s): INR, PROTIME in the last 168 hours. Cardiac Enzymes: No results for input(s): CKTOTAL, CKMB, CKMBINDEX, TROPONINI in the last 168 hours. BNP (last 3 results) Recent Labs    12/18/18 1439  PROBNP 228.0*   CBG: No results for input(s): GLUCAP in the last 168 hours. Studies: US Renal  Result Date: 12/25/2018 CLINICAL DATA:  Followup hydronephrosis EXAM: RENAL / URINARY TRACT ULTRASOUND COMPLETE COMPARISON:  CT 12/23/2018, ultrasound 12/10/2018 FINDINGS: Right Kidney: Renal measurements: 10.3 x 4.5 x 5.2 cm = volume: 127 mL . Echogenicity within normal limits. No mass or hydronephrosis visualized. Left Kidney: Renal measurements: 9.7 x 5.5  x 4.9 cm = volume: 136 mL. Echogenicity within normal limits. Moderate hydronephrosis. Scattered echogenic foci within the left kidney compatible with known renal calculi. Bladder: Bladder is mildly distended and appears within normal limits. Right ureteral jet was seen. No left ureteral jet was visualized during the course of the examination. Other: Small volume ascites. Irregular splenic mass, better characterized on recent CT. IMPRESSION: 1. Left-sided nephrolithiasis with moderate left hydronephrosis. No left-sided ureteral jet was seen. 2. Small volume ascites. 3. Partially visualized splenic mass. Electronically Signed   By: Davina Poke M.D.   On: 12/25/2018 11:34     Time spent: 35 minutes  Author: Berle Mull, MD [facility] 12/25/2018 8:37 PM  To reach On-call, see care teams to locate the attending and reach out to them via [email or web address]. If 7PM-7AM, please contact night-coverage If you still have difficulty reaching the attending provider, please page the [facility] (Director on Call) for [facility] on amion for assistance.

## 2018-12-26 ENCOUNTER — Other Ambulatory Visit: Payer: Self-pay | Admitting: Hematology

## 2018-12-26 DIAGNOSIS — R918 Other nonspecific abnormal finding of lung field: Secondary | ICD-10-CM

## 2018-12-26 DIAGNOSIS — R590 Localized enlarged lymph nodes: Secondary | ICD-10-CM

## 2018-12-26 DIAGNOSIS — D649 Anemia, unspecified: Secondary | ICD-10-CM

## 2018-12-26 DIAGNOSIS — R161 Splenomegaly, not elsewhere classified: Secondary | ICD-10-CM

## 2018-12-26 LAB — CBC WITH DIFFERENTIAL/PLATELET
Abs Immature Granulocytes: 0.04 10*3/uL (ref 0.00–0.07)
Basophils Absolute: 0.1 10*3/uL (ref 0.0–0.1)
Basophils Relative: 1 %
Eosinophils Absolute: 0.2 10*3/uL (ref 0.0–0.5)
Eosinophils Relative: 2 %
HCT: 31.4 % — ABNORMAL LOW (ref 39.0–52.0)
Hemoglobin: 10.2 g/dL — ABNORMAL LOW (ref 13.0–17.0)
Immature Granulocytes: 0 %
Lymphocytes Relative: 6 %
Lymphs Abs: 0.6 10*3/uL — ABNORMAL LOW (ref 0.7–4.0)
MCH: 30.4 pg (ref 26.0–34.0)
MCHC: 32.5 g/dL (ref 30.0–36.0)
MCV: 93.5 fL (ref 80.0–100.0)
Monocytes Absolute: 0.6 10*3/uL (ref 0.1–1.0)
Monocytes Relative: 5 %
Neutro Abs: 9.1 10*3/uL — ABNORMAL HIGH (ref 1.7–7.7)
Neutrophils Relative %: 86 %
Platelets: 315 10*3/uL (ref 150–400)
RBC: 3.36 MIL/uL — ABNORMAL LOW (ref 4.22–5.81)
RDW: 14.7 % (ref 11.5–15.5)
WBC: 10.5 10*3/uL (ref 4.0–10.5)
nRBC: 0 % (ref 0.0–0.2)

## 2018-12-26 LAB — BASIC METABOLIC PANEL
Anion gap: 11 (ref 5–15)
BUN: 34 mg/dL — ABNORMAL HIGH (ref 8–23)
CO2: 20 mmol/L — ABNORMAL LOW (ref 22–32)
Calcium: 7.9 mg/dL — ABNORMAL LOW (ref 8.9–10.3)
Chloride: 108 mmol/L (ref 98–111)
Creatinine, Ser: 1.75 mg/dL — ABNORMAL HIGH (ref 0.61–1.24)
GFR calc Af Amer: 40 mL/min — ABNORMAL LOW (ref >60)
GFR calc non Af Amer: 35 mL/min — ABNORMAL LOW (ref >60)
Glucose, Bld: 103 mg/dL — ABNORMAL HIGH (ref 70–99)
Potassium: 3.5 mmol/L (ref 3.5–5.1)
Sodium: 139 mmol/L (ref 135–145)

## 2018-12-26 LAB — MAGNESIUM: Magnesium: 2.1 mg/dL (ref 1.7–2.4)

## 2018-12-26 MED ORDER — FUROSEMIDE 20 MG PO TABS: 20.0000 mg | ORAL_TABLET | Freq: Every day | ORAL | 0 refills | Status: DC | PRN

## 2018-12-26 MED ORDER — DOCUSATE SODIUM 100 MG PO CAPS: 100.0000 mg | ORAL_CAPSULE | Freq: Two times a day (BID) | ORAL | 0 refills | Status: AC

## 2018-12-26 MED ORDER — SIMETHICONE 80 MG PO TABS: 80.0000 mg | ORAL_TABLET | Freq: Four times a day (QID) | ORAL | 0 refills | Status: AC

## 2018-12-26 NOTE — Consult Note (Signed)
H&P Physician requesting consult: Berle Mull  Chief Complaint: Left hydronephrosis  History of Present Illness:  85 year old male recently admitted for hypercalcemia present to the emergency department with abdominal pain and left-sided upper quadrant pain.  He underwent a CT scan of the abdomen and pelvis that revealed left-sided hydronephrosis.  There was no obvious source of the obstruction.  He was also found to have a large splenic mass as well as lymphadenopathy concerning for metastatic cancer, unknown primary.  He also had acute renal insufficiency.  Renal function is improving with conservative management.  After treatment of constipation, the patient's pain has resolved.  Past Medical History:  Diagnosis Date  . BPH (benign prostatic hypertrophy)   . Colon polyps   . GERD (gastroesophageal reflux disease)    Upper endoscopy 2003, Dr. Velora Heckler  . Hemorrhoids   . Hiatal hernia 2010  . Hyperlipidemia   . Hypertension   . Internal hemorrhoids   . Microscopic hematuria 2001   Dr.  Luanne Bras  . Pneumonia     X 2 in high school   Past Surgical History:  Procedure Laterality Date  . COLONOSCOPY  [id number]   Internal hemorrhoids, Dr. Velora Heckler  . NOSE SURGERY     Submucosal resection in the 1960s  . TONSILLECTOMY        . UPPER GASTROINTESTINAL ENDOSCOPY  2003   GERD  . WISDOM TOOTH EXTRACTION      Home Medications:  Medications Prior to Admission  Medication Sig Dispense Refill Last Dose  . amLODipine (NORVASC) 5 MG tablet Take 1 tablet (5 mg total) by mouth daily. 90 tablet 3 12/23/2018 at Unknown time  . Aspirin (ADULT ASPIRIN LOW STRENGTH) 81 MG EC tablet Take 81 mg by mouth daily.     12/23/2018 at Unknown time  . furosemide (LASIX) 20 MG tablet TAKE 1 TABLET(20 MG) BY MOUTH DAILY (Patient taking differently: Take 20 mg by mouth daily. ) 90 tablet 0 12/23/2018 at Unknown time  . loratadine (CLARITIN) 10 MG tablet Take 10 mg by mouth daily.     12/23/2018 at Unknown  time  . metoprolol succinate (TOPROL-XL) 25 MG 24 hr tablet Take 1 tablet (25 mg total) by mouth daily. 90 tablet 3 12/23/2018 at 0900  . Multiple Vitamin (MULTIVITAMIN) tablet Take 1 tablet by mouth daily.     12/23/2018 at Unknown time  . omeprazole (PRILOSEC) 40 MG capsule Take 1 capsule (40 mg total) by mouth daily. 90 capsule 6 12/23/2018 at Unknown time  . ondansetron (ZOFRAN) 4 MG tablet Take 1 tablet (4 mg total) by mouth every 6 (six) hours as needed for nausea. 20 tablet 0 12/23/2018 at Unknown time  . polyethylene glycol powder (GLYCOLAX/MIRALAX) 17 GM/SCOOP powder Mix 1 scoop daily with water until constipation is relieved. 255 g 1 Past Month at Unknown time  . pravastatin (PRAVACHOL) 40 MG tablet TAKE ONE-HALF TABLET BY MOUTH EVERY NIGHT AT BEDTIME (Patient taking differently: Take 20 mg by mouth at bedtime. ) 45 tablet 3 12/22/2018 at Unknown time  . vitamin B-12 (CYANOCOBALAMIN) 1000 MCG tablet Take 1,000 mcg by mouth daily.   12/23/2018 at Unknown time   Allergies: No Known Allergies  Family History  Problem Relation Age of Onset  . Diabetes Mother   . Subarachnoid hemorrhage Daughter   . Lung cancer Brother        smoker  . Other Paternal Grandfather        typhoid  . Heart disease Neg Hx   .  Stroke Neg Hx   . Hypertension Neg Hx   . Colon cancer Neg Hx    Social History:  reports that he quit smoking about 53 years ago. His smoking use included cigarettes and pipe. He has a 9.75 pack-year smoking history. He has never used smokeless tobacco. He reports current alcohol use. He reports that he does not use drugs.  ROS: A complete review of systems was performed.  All systems are negative except for pertinent findings as noted. ROS   Physical Exam:  Vital signs in last 24 hours: Temp:  [98.2 F (36.8 C)-98.7 F (37.1 C)] 98.7 F (37.1 C) (10/02 0517) Pulse Rate:  [92-103] 92 (10/02 0517) Resp:  [15-20] 15 (10/02 0517) BP: (119-142)/(67-84) 142/71 (10/02 0517) SpO2:   [94 %-97 %] 94 % (10/02 0517) Weight:  [68.4 kg-69.2 kg] 68.4 kg (10/02 0517) General:  Alert and oriented, No acute distress HEENT: Normocephalic, atraumatic Neck: No JVD or lymphadenopathy Cardiovascular: Regular rate and rhythm Lungs: Regular rate and effort Abdomen: Soft, nontender, nondistended, no abdominal masses Back: No CVA tenderness Extremities: No edema Neurologic: Grossly intact  Laboratory Data:  Results for orders placed or performed during the hospital encounter of 12/23/18 (from the past 24 hour(s))  CBC with Differential/Platelet     Status: Abnormal   Collection Time: 12/26/18  8:05 AM  Result Value Ref Range   WBC 10.5 4.0 - 10.5 K/uL   RBC 3.36 (L) 4.22 - 5.81 MIL/uL   Hemoglobin 10.2 (L) 13.0 - 17.0 g/dL   HCT 31.4 (L) 39.0 - 52.0 %   MCV 93.5 80.0 - 100.0 fL   MCH 30.4 26.0 - 34.0 pg   MCHC 32.5 30.0 - 36.0 g/dL   RDW 14.7 11.5 - 15.5 %   Platelets 315 150 - 400 K/uL   nRBC 0.0 0.0 - 0.2 %   Neutrophils Relative % 86 %   Neutro Abs 9.1 (H) 1.7 - 7.7 K/uL   Lymphocytes Relative 6 %   Lymphs Abs 0.6 (L) 0.7 - 4.0 K/uL   Monocytes Relative 5 %   Monocytes Absolute 0.6 0.1 - 1.0 K/uL   Eosinophils Relative 2 %   Eosinophils Absolute 0.2 0.0 - 0.5 K/uL   Basophils Relative 1 %   Basophils Absolute 0.1 0.0 - 0.1 K/uL   Immature Granulocytes 0 %   Abs Immature Granulocytes 0.04 0.00 - 0.07 K/uL  Basic metabolic panel     Status: Abnormal   Collection Time: 12/26/18  8:05 AM  Result Value Ref Range   Sodium 139 135 - 145 mmol/L   Potassium 3.5 3.5 - 5.1 mmol/L   Chloride 108 98 - 111 mmol/L   CO2 20 (L) 22 - 32 mmol/L   Glucose, Bld 103 (H) 70 - 99 mg/dL   BUN 34 (H) 8 - 23 mg/dL   Creatinine, Ser 1.75 (H) 0.61 - 1.24 mg/dL   Calcium 7.9 (L) 8.9 - 10.3 mg/dL   GFR calc non Af Amer 35 (L) >60 mL/min   GFR calc Af Amer 40 (L) >60 mL/min   Anion gap 11 5 - 15  Magnesium     Status: None   Collection Time: 12/26/18  8:05 AM  Result Value Ref Range    Magnesium 2.1 1.7 - 2.4 mg/dL   Recent Results (from the past 240 hour(s))  Urine culture     Status: None   Collection Time: 12/23/18  4:45 PM   Specimen: Urine, Random  Result Value Ref  Range Status   Specimen Description   Final    URINE, RANDOM Performed at [facility], [place] [address] [place], [place] [address]    Special Requests   Final    NONE Performed at [facility], [place] [address], [place], [place] [address]    Culture   Final    NO GROWTH Performed at [facility], [place] [address] [place], [place] [address]    Report Status 12/24/2018 FINAL  Final   Creatinine: Recent Labs    12/23/18 1516 12/24/18 0658 12/25/18 0535 12/26/18 0805  CREATININE 2.19* 1.93* 1.88* 1.75*     CT scan personally reviewed and is detailed in history of present illness  Impression/Assessment:    Left hydronephrosis  acute renal insufficiency, improving  Plan:   I discussed ureteral stent placement versus observation.  Given that his renal function is improving, we decided against urgent ureteral stent placement.  If he needs chemotherapy in the future, we can consider placement of a stent prior to initiating chemotherapy to maximize renal function.  However, he still needs tissue diagnosis prior to treatment considerations.  Marton Redwood, III 12/26/2018, 2:13 PM

## 2018-12-26 NOTE — Care Management (Signed)
ED CM was contacted concerning late discharge patient states he is active with [facility] and plans to resume  Services with them. CM faxed orders to [facility]  No further questions or concerns verbalized.

## 2018-12-26 NOTE — Progress Notes (Signed)
Patient with likely non-Hodgkin's lymphoma. Appreciate evaluation and input by Dr. Beatrice Lecher to safely biopsy retroperitoneal lymph node or lung nodules.  Given the patient's age and desire to avoid a surgical laparoscopic biopsy and related anesthesia I discussed sampling options with the patient and his son Ricardo Sloan. Plan -We will get outpatient PET CT scan as soon as possible to try to evaluate best biopsy target -CT-guided bone marrow biopsy given patient's anemia to evaluate for any evidence of lymphoma or pathology that might explain this. Ricardo Sloan understands that the bone marrow biopsy might not provide a diagnosis and we might still have to explore options to schedule the patient for surgical biopsy of the retroperitoneal lymph node or lung nodule. -Last option would be to do a diagnostic and therapeutic splenectomy though this is a more significant procedure and the patient is not in favor of this-this is understandable.  Ricardo Lone MD MS Hematology/Oncology Physician [facility]

## 2018-12-26 NOTE — Progress Notes (Signed)
Oncology short note  Will need urology consultation for left hydronephrosis inpatient vs outpatient Outpatient PET/CT Per IR - no good biopsy target. Given his anemia --would pursue CT guide BM Bx prior to consideration of laparoscopic surgical biopsy. Okay to discharge from oncology standpoint if otherwise stable.  Sullivan Lone MD MS

## 2018-12-26 NOTE — Care Management Important Message (Signed)
Important Message  Patient Details IM Letter given to Sharren Bridge SW to present to the Patient Name: DA BEISEL MRN: [id number] Date of Birth: 12-14-1931   Medicare Important Message Given:  Yes     Kerin Salen 12/26/2018, 11:53 AM

## 2018-12-26 NOTE — Progress Notes (Signed)
BP= 142/71. Patient has history of HTN. Patient is alert and oriented x4.

## 2018-12-27 NOTE — Discharge Summary (Signed)
[facility] Discharge Summary   Patient: Ricardo Sloan [id number]   PCP: Libby Maw, MD DOB: 11-26-1931   Date of admission: 12/23/2018   Date of discharge: 12/26/2018     Discharge Diagnoses:  Principal diagnosis Severe constipation secondary to splenic mass Small bowel obstruction  Principal Problem:   Abdominal pain Active Problems:   Essential hypertension   Pulmonary nodules/lesions, multiple   Bowel obstruction ([place])   Splenic mass   Lymphadenopathy   Admitted From: Home Disposition:  Home with home health  Recommendations for Outpatient Follow-up:  1. PCP: Follow-up on renal function, bowel regimen 2. Follow up LABS/TEST: Follow-up with oncology for biopsy  Follow-up Information    Libby Maw, MD. Schedule an appointment as soon as possible for a visit in 1 week(s).   Specialty: Family Medicine Contact information: [place] [id number] [place], [facility] Follow up.   Specialty: Home Health Services Why: Resume Home Health Services Contact information: [address] [place] [id number] [phone]          Diet recommendation: Cardiac diet  Activity: The patient is advised to gradually reintroduce usual activities,as tolerated .  Discharge Condition: good  Code Status: Full code   History of present illness: As per the H and P dictated on admission, "Ricardo Sloan  is a 85 y.o. male, w hypertension, hyperlipidemia, Gerd Bph, h/o microscopic hematuria, nephrolithiasis, hx of RUL spiculated nodule on CT chest, Kappa Light chain disease,  w recent admission for hypercalcemia, presents with c/o abdominal pain, LUQ/ epigastric for the past 2-3 days.  Associated with n/v.  Pt denies fever, chills, cough, cp, palp, sob, diarrhea, brbpr, black stool.  Last BM yesterday In ED T 97.8, P 95 R 15, Bp 141/90  Pox 99% on RA"  Hospital Course:  Summary of his active  problems in the hospital is as following. Abdominal pain secondary to constipation, obstruction, splenic mass Initially was n.p.o.   tolerating regular diet. Gastroenterology was consulted although felt no indication for inpatient procedures. Continue stool softener on discharge.  Nausea/ vomitting Hiccups Resolved.  No further complaints.  Splenic mass Splenic masses not a good option for biopsy given high risk for bleeding. Oncology consulted IR who felt that the patient does not have any good biopsy site. Recommend outpatient PET scan prior to considering biopsy. From oncological perspective patient can be discharged home.  Hypertension Cont Amlodipine 5mg  po qday (note this medication can cause constipation) Cont Metoprolol XL 25mg  po qday  Hyperlipidemia Cont Pravastatin 20mg  po qhs  Gerd Cont PPI  H/o Hypercalcemia, Kappa Light Chain disease F/u as outpatient  Left-sided hydronephrosis with renal stone. AKI. Continue with oral hydration at home. Changing schedule Lasix to only as needed. Follow-up with urology in 2 weeks.  Home health was arranged on discharge On the day of the discharge the patient's vitals were stable, and no other acute medical condition were reported by patient. the patient was felt safe to be discharge at Home with Home health.  Consultants: Oncology, IR, gastroenterology, urology Procedures: None  DISCHARGE MEDICATION: Allergies as of 12/26/2018   No Known Allergies     Medication List    STOP taking these medications   amLODipine 5 MG tablet Commonly known as: NORVASC     TAKE these medications   Adult Aspirin Low Strength 81 MG EC tablet Generic drug: Aspirin Take 81 mg by mouth daily.  Notes to patient: 12/27/2018   docusate sodium 100 MG capsule Commonly known as: COLACE Take 1 capsule (100 mg total) by mouth 2 (two) times daily. Notes to patient: 12/27/2018   furosemide 20 MG tablet Commonly known as: LASIX Take  1 tablet (20 mg total) by mouth daily as needed for fluid or edema. TAKE 1 TABLET(20 MG) BY MOUTH DAILY What changed:   how much to take  how to take this  when to take this  reasons to take this   loratadine 10 MG tablet Commonly known as: CLARITIN Take 10 mg by mouth daily. Notes to patient: 12/27/2018   metoprolol succinate 25 MG 24 hr tablet Commonly known as: TOPROL-XL Take 1 tablet (25 mg total) by mouth daily. Notes to patient: 12/27/2018   multivitamin tablet Take 1 tablet by mouth daily. Notes to patient: 12/27/2018   omeprazole 40 MG capsule Commonly known as: PRILOSEC Take 1 capsule (40 mg total) by mouth daily. Notes to patient: 12/27/2018   ondansetron 4 MG tablet Commonly known as: ZOFRAN Take 1 tablet (4 mg total) by mouth every 6 (six) hours as needed for nausea.   polyethylene glycol powder 17 GM/SCOOP powder Commonly known as: GLYCOLAX/MIRALAX Mix 1 scoop daily with water until constipation is relieved. Notes to patient: 12/27/2018   pravastatin 40 MG tablet Commonly known as: PRAVACHOL TAKE ONE-HALF TABLET BY MOUTH EVERY NIGHT AT BEDTIME What changed:   how much to take  how to take this  when to take this  additional instructions Notes to patient: 12/26/2018   Simethicone 80 MG Tabs Take 1 tablet (80 mg total) by mouth 4 (four) times daily. Notes to patient: 12/27/2018   vitamin B-12 1000 MCG tablet Commonly known as: CYANOCOBALAMIN Take 1,000 mcg by mouth daily. Notes to patient: 12/27/2018      No Known Allergies Discharge Instructions    Diet - low sodium heart healthy   Complete by: As directed    Discharge instructions   Complete by: As directed    It is important that you read the given instructions as well as go over your medication list with RN to help you understand your care after this hospitalization.  Please follow-up with PCP in 1-2 weeks.  Please note that NO REFILLS for any discharge medications will be  authorized once you are discharged, as it is imperative that you return to your primary care physician (or establish a relationship with a primary care physician if you do not have one) for your aftercare needs so that they can reassess your need for medications and monitor your lab values.  Please request your primary care physician to go over all Hospital Tests and Procedure/Radiological results at the follow up. Please get all Hospital records sent to your PCP by signing hospital release before you go home.   Do not take more than prescribed Pain, Sleep and Anxiety Medications.  You were cared for by a hospitalist during your hospital stay. If you have any questions about your discharge medications or the care you received while you were in the hospital after you are discharged, you can call the unit @UNIT @ you were admitted to and ask to speak with the hospitalist Berle Mull. Ask for Hospitalist on call if the hospitalist that took care of you is not available.   Once you are discharged, your primary care physician will handle any further medical issues.  You Must read complete instructions/literature along with all the possible adverse reactions/side effects for all  the Medicines you take and that have been prescribed to you. Take any new Medicines after you have completely understood and accept all the possible adverse reactions/side effects.  If you have smoked or chewed Tobacco in the last 2 yrs please STOP smoking STOP any Recreational drug use.   Increase activity slowly   Complete by: As directed      Discharge Exam: Filed Weights   12/25/18 0459 12/25/18 1438 12/26/18 0517  Weight: 69 kg 69.2 kg 68.4 kg   Vitals:   12/25/18 2026 12/26/18 0517  BP: 119/67 (!) 142/71  Pulse: (!) 103 92  Resp: 20 15  Temp: 98.7 F (37.1 C) 98.7 F (37.1 C)  SpO2: 97% 94%   General: Appear in no distress, no Rash; Oral Mucosa Clear, moist. no Abnormal Mass Or lumps Cardiovascular: S1 and  S2 Present, no Murmur, Respiratory: normal respiratory effort, Bilateral Air entry present and Clear to Auscultation, no Crackles, no wheezes Abdomen: Bowel Sound present, Soft and no tenderness, no hernia Extremities: no Pedal edema, no calf tenderness Neurology: alert and oriented to time, place, and person affect appropriate.  The results of significant diagnostics from this hospitalization (including imaging, microbiology, ancillary and laboratory) are listed below for reference.    Significant Diagnostic Studies: Ct Abdomen Pelvis Wo Contrast  Result Date: 12/23/2018 CLINICAL DATA:  85 year old male with acute abdominal and pelvic pain with distension. EXAM: CT ABDOMEN AND PELVIS WITHOUT CONTRAST TECHNIQUE: Multidetector CT imaging of the abdomen and pelvis was performed following the standard protocol without IV contrast. COMPARISON:  12/11/2018 FINDINGS: Please note that parenchymal abnormalities may be missed without intravenous contrast. Lower chest: Small bilateral pleural effusions and mild bibasilar atelectasis again noted. Hepatobiliary: The liver and gallbladder are unremarkable except for a stable hepatic cyst. No biliary dilatation. Pancreas: No definite abnormality Spleen: Heterogeneous enlarged spleen noted with irregular 10 cm hypoechoic area along the splenic hilum is worrisome for malignancy. Adrenals/Urinary Tract: Bilateral renal atrophy, moderate LEFT hydronephrosis and LEFT renal calculi again noted. The adrenal glands are unremarkable. Bladder is unremarkable. Stomach/Bowel: A very large amount of stool is noted within the proximal and transverse colon. There is a transition point at the splenic flexure at the irregular splenic mass with collapsed descending and sigmoid colon. No other definite bowel abnormalities are noted. Vascular/Lymphatic: Aortic atherosclerosis. Enlarged retroperitoneal/iliac lymph nodes are noted with the largest lymph node measuring 3.2 cm (series 2:  Image 20). Reproductive: Mild prostate enlargement noted. Other: A small amount of ascites within the abdomen and pelvis noted. No pneumoperitoneum. Musculoskeletal: No acute or suspicious bony abnormalities are noted. Multilevel degenerative disc disease, spondylosis and facet arthropathy within the lumbar spine noted. IMPRESSION: 1. 10 cm irregular mass centered in the medial spleen with abdominal/retroperitoneal adenopathy highly suspicious for malignancy with metastatic spread. Small amount of ascites. 2. Very large amount of stool within the proximal transverse colon with relative colonic obstruction at the splenic flexure likely from the irregular splenic mass. No pneumoperitoneum. 3. Moderate LEFT hydronephrosis and LEFT renal calculi again noted. 4. Unchanged small bilateral pleural effusions and bibasilar atelectasis. 5.  Aortic Atherosclerosis (ICD10-I70.0). Electronically Signed   By: Margarette Canada M.D.   On: 12/23/2018 20:05   Ct Abdomen Pelvis Wo Contrast  Result Date: 12/11/2018 CLINICAL DATA:  Known left-sided hydronephrosis and chronic renal disease EXAM: CT CHEST, ABDOMEN AND PELVIS WITHOUT CONTRAST TECHNIQUE: Multidetector CT imaging of the chest, abdomen and pelvis was performed following the standard protocol without IV contrast. COMPARISON:  04/10/2017 FINDINGS:  CT CHEST FINDINGS Cardiovascular: Thoracic aorta demonstrates atherosclerotic calcifications without aneurysmal dilatation. Coronary calcifications are noted. No cardiac enlargement is seen. Mediastinum/Nodes: Thoracic inlet is within normal limits. No hilar or mediastinal adenopathy is noted. The esophagus as visualized is within normal limits. Lungs/Pleura: Lungs are well aerated bilaterally with mild emphysematous changes. Small bilateral pleural effusions are seen left greater than right. Mild left lower lobe atelectasis is noted. Scarring in the right middle lobe and lingula on the left are again seen and stable. Multiple nodules  are noted throughout both lungs increased in appearance when compared with the prior exam. This is most notable in the right upper lobe where a 10 mm nodule is noted best seen on image number 57 of series 4. Scattered smaller nodules are seen. Previously noted ground-glass attenuation area in the posterior right upper lobe has improved but demonstrates some residual nodular changes. The remainder of the nodular densities measure less than 5 mm. Musculoskeletal: Degenerative changes of the thoracic spine are noted. No lytic or sclerotic lesions are seen. Extrapleural soft tissue densities are noted along the left lateral aspect of the sternum stable in appearance from the prior exam from 2019 again likely benign in etiology. CT ABDOMEN PELVIS FINDINGS Hepatobiliary: Somewhat limited due to lack of IV contrast. Stable hepatic cyst is noted. The gallbladder is within normal limits. Pancreas: Atrophic changes of the pancreas are seen. Spleen: Spleen is enlarged in size. Evaluation is somewhat limited due to lack of IV contrast. A mixed attenuation lesion is identified which measures at least 10 cm in greatest dimension. This would correspond with that seen on prior ultrasound examination. In retrospect, this was likely present on the prior exam of 2019 but again incompletely evaluated because of lack of IV contrast. Previously it measured just over 5 cm. Adrenals/Urinary Tract: Adrenal glands are within normal limits. The right kidney demonstrates no focal abnormality. The left kidney demonstrates some renal calculi. Left-sided hydronephrosis is seen although the ureter appears to be within normal limits. No ureteral calculi are noted. The bladder is well distended. Stomach/Bowel: The appendix is within normal limits. Colon is filled with fecal material although no obstructive changes are seen. Small bowel and stomach appear within normal limits. Vascular/Lymphatic: Aortic atherosclerosis. No enlarged abdominal or  pelvic lymph nodes. Reproductive: Prostate is unremarkable. Other: Free fluid is noted within the pelvis. Free fluid also surrounds the liver. Musculoskeletal: Degenerative changes of the lumbar spine are seen. No lytic or sclerotic lesions are noted. IMPRESSION: CT of the chest: Scattered nodular densities bilaterally which have increased in the interval from the prior exam particularly in the right upper lobe where a 10 mm mildly spiculated nodule is seen as described. Small pleural effusions are noted as well. Outpatient PET-CT may be helpful for further evaluation. Soft tissue densities are noted along the left lateral aspect of the sternum stable in appearance from the prior exam and likely benign in etiology. CT of the abdomen and pelvis: Mixed attenuation mass lesion within the spleen as described. This is larger than seen on prior examination and may simply represent an enlarged hemangioma. The evaluation is somewhat limited due to lack of IV contrast. More aggressive process could not be totally excluded. Left renal calculi with evidence of left-sided hydronephrosis. This is new from the prior exam but similar to that seen on recent ultrasound. No obstructing lesion or stone is noted. Mild ascites. Electronically Signed   By: Inez Catalina M.D.   On: 12/11/2018 19:52   Dg  Ribs Unilateral W/chest Right  Result Date: 12/10/2018 CLINICAL DATA:  Chest pain. EXAM: RIGHT RIBS AND CHEST - 3+ VIEW COMPARISON:  Radiographs of December 08, 2018. FINDINGS: No fracture or other bone lesions are seen involving the ribs. There is no evidence of pneumothorax or pleural effusion. As noted on recent x-ray, nodular density is seen in right upper lobe. Heart size and mediastinal contours are within normal limits. IMPRESSION: Normal right ribs. No pneumothorax or pleural effusion is noted. As noted on recent x-ray, nodular density is noted in right upper lobe, and CT scan of the chest is recommended to rule out pulmonary  nodule or mass. Electronically Signed   By: Marijo Conception M.D.   On: 12/10/2018 14:54   Ct Chest Wo Contrast  Result Date: 12/11/2018 CLINICAL DATA:  Known left-sided hydronephrosis and chronic renal disease EXAM: CT CHEST, ABDOMEN AND PELVIS WITHOUT CONTRAST TECHNIQUE: Multidetector CT imaging of the chest, abdomen and pelvis was performed following the standard protocol without IV contrast. COMPARISON:  04/10/2017 FINDINGS: CT CHEST FINDINGS Cardiovascular: Thoracic aorta demonstrates atherosclerotic calcifications without aneurysmal dilatation. Coronary calcifications are noted. No cardiac enlargement is seen. Mediastinum/Nodes: Thoracic inlet is within normal limits. No hilar or mediastinal adenopathy is noted. The esophagus as visualized is within normal limits. Lungs/Pleura: Lungs are well aerated bilaterally with mild emphysematous changes. Small bilateral pleural effusions are seen left greater than right. Mild left lower lobe atelectasis is noted. Scarring in the right middle lobe and lingula on the left are again seen and stable. Multiple nodules are noted throughout both lungs increased in appearance when compared with the prior exam. This is most notable in the right upper lobe where a 10 mm nodule is noted best seen on image number 57 of series 4. Scattered smaller nodules are seen. Previously noted ground-glass attenuation area in the posterior right upper lobe has improved but demonstrates some residual nodular changes. The remainder of the nodular densities measure less than 5 mm. Musculoskeletal: Degenerative changes of the thoracic spine are noted. No lytic or sclerotic lesions are seen. Extrapleural soft tissue densities are noted along the left lateral aspect of the sternum stable in appearance from the prior exam from 2019 again likely benign in etiology. CT ABDOMEN PELVIS FINDINGS Hepatobiliary: Somewhat limited due to lack of IV contrast. Stable hepatic cyst is noted. The gallbladder is  within normal limits. Pancreas: Atrophic changes of the pancreas are seen. Spleen: Spleen is enlarged in size. Evaluation is somewhat limited due to lack of IV contrast. A mixed attenuation lesion is identified which measures at least 10 cm in greatest dimension. This would correspond with that seen on prior ultrasound examination. In retrospect, this was likely present on the prior exam of 2019 but again incompletely evaluated because of lack of IV contrast. Previously it measured just over 5 cm. Adrenals/Urinary Tract: Adrenal glands are within normal limits. The right kidney demonstrates no focal abnormality. The left kidney demonstrates some renal calculi. Left-sided hydronephrosis is seen although the ureter appears to be within normal limits. No ureteral calculi are noted. The bladder is well distended. Stomach/Bowel: The appendix is within normal limits. Colon is filled with fecal material although no obstructive changes are seen. Small bowel and stomach appear within normal limits. Vascular/Lymphatic: Aortic atherosclerosis. No enlarged abdominal or pelvic lymph nodes. Reproductive: Prostate is unremarkable. Other: Free fluid is noted within the pelvis. Free fluid also surrounds the liver. Musculoskeletal: Degenerative changes of the lumbar spine are seen. No lytic or sclerotic lesions  are noted. IMPRESSION: CT of the chest: Scattered nodular densities bilaterally which have increased in the interval from the prior exam particularly in the right upper lobe where a 10 mm mildly spiculated nodule is seen as described. Small pleural effusions are noted as well. Outpatient PET-CT may be helpful for further evaluation. Soft tissue densities are noted along the left lateral aspect of the sternum stable in appearance from the prior exam and likely benign in etiology. CT of the abdomen and pelvis: Mixed attenuation mass lesion within the spleen as described. This is larger than seen on prior examination and may  simply represent an enlarged hemangioma. The evaluation is somewhat limited due to lack of IV contrast. More aggressive process could not be totally excluded. Left renal calculi with evidence of left-sided hydronephrosis. This is new from the prior exam but similar to that seen on recent ultrasound. No obstructing lesion or stone is noted. Mild ascites. Electronically Signed   By: Inez Catalina M.D.   On: 12/11/2018 19:52   Nm Bone Scan Whole Body  Result Date: 12/12/2018 CLINICAL DATA:  Abdominal neoplasm. Concern for MM. Current pain right clavicle. EXAM: NUCLEAR MEDICINE WHOLE BODY BONE SCAN TECHNIQUE: Whole body anterior and posterior images were obtained approximately 3 hours after intravenous injection of radiopharmaceutical. RADIOPHARMACEUTICALS:  21.2 mCi Technetium-50m MDP IV COMPARISON:  CT 12/11/2018 FINDINGS: Exam demonstrates 2 adjacent foci of increased radiotracer activity over the right anterior third and fourth rib ends likely posttraumatic. Small focus of increased tracer activity over the soft tissues just above the left hip lateral to the iliac bone seen only on the anterior images without corresponding abnormality on CT as this may be artifactual. No focal abnormality over the right clavicle to explain patient's right clavicular pain. Minimal symmetric uptake over the shoulders, elbows, knees and ankles compatible with degenerative changes. Increased tracer activity over dilated left intrarenal collecting system/renal pelvis as seen on CT. IMPRESSION: 1. No focal abnormality over the right clavicle to account for patient's pain. 2. Two adjacent foci of increased tracer activity over the right third and fourth anterior rib ends likely posttraumatic as metastatic disease is much less likely. 3.  Degenerative changes of the appendicular skeleton. 4.  Left hydronephrosis. Electronically Signed   By: Marin Olp M.D.   On: 12/12/2018 16:21   US Renal  Result Date: 12/25/2018 CLINICAL DATA:   Followup hydronephrosis EXAM: RENAL / URINARY TRACT ULTRASOUND COMPLETE COMPARISON:  CT 12/23/2018, ultrasound 12/10/2018 FINDINGS: Right Kidney: Renal measurements: 10.3 x 4.5 x 5.2 cm = volume: 127 mL . Echogenicity within normal limits. No mass or hydronephrosis visualized. Left Kidney: Renal measurements: 9.7 x 5.5 x 4.9 cm = volume: 136 mL. Echogenicity within normal limits. Moderate hydronephrosis. Scattered echogenic foci within the left kidney compatible with known renal calculi. Bladder: Bladder is mildly distended and appears within normal limits. Right ureteral jet was seen. No left ureteral jet was visualized during the course of the examination. Other: Small volume ascites. Irregular splenic mass, better characterized on recent CT. IMPRESSION: 1. Left-sided nephrolithiasis with moderate left hydronephrosis. No left-sided ureteral jet was seen. 2. Small volume ascites. 3. Partially visualized splenic mass. Electronically Signed   By: Davina Poke M.D.   On: 12/25/2018 11:34   US Renal  Result Date: 12/10/2018 CLINICAL DATA:  Acute renal injury with left flank pain EXAM: RENAL / URINARY TRACT ULTRASOUND COMPLETE COMPARISON:  None. FINDINGS: Right Kidney: Renal measurements: 11.9 x 4.3 x 4.7 cm. = volume: 125 mL. Diffuse  increased echogenicity is noted. No mass lesion or hydronephrosis is noted. Left Kidney: Renal measurements: 13.6 x 6.2 x 7.6 cm. = volume: 429 mL. Moderate hydronephrosis is seen. Mild increased echogenicity is noted. Bladder: Bladder is well distended. Bilateral pleural effusions and mild ascites is seen. Spleen is mildly prominent with some diffuse heterogeneity within the central portion suggestive of underlying mass. Contrast enhanced CT may be helpful. Alternatively MRI on a nonemergent basis could be performed. IMPRESSION: Left-sided hydronephrosis. Bilateral effusions and mild ascites. Increased echogenicity in the right kidney is noted consistent with medical renal  disease. Mild increased echogenicity is noted on the left. Findings suspicious for a mass within the spleen. This may simply represent a large hemangioma. Contrast enhanced CT or MRI may be helpful for further evaluation. Electronically Signed   By: Inez Catalina M.D.   On: 12/10/2018 20:08   Dg Chest Port 1 View  Result Date: 12/08/2018 CLINICAL DATA:  Hypercalcemia workup, weakness EXAM: PORTABLE CHEST 1 VIEW COMPARISON:  04/25/2017, CT chest 04/10/2017 FINDINGS: No focal opacity or pleural effusion. Normal cardiomediastinal silhouette with aortic atherosclerosis. No pneumothorax. Possible small lung nodule in the right upper lobe. IMPRESSION: No active disease. Possible small lung nodule in the right upper lobe which may be evaluated with follow-up CT. Electronically Signed   By: Donavan Foil M.D.   On: 12/08/2018 23:08   Dg Abd Portable 1v  Result Date: 12/24/2018 CLINICAL DATA:  Constipation. EXAM: PORTABLE ABDOMEN - 1 VIEW COMPARISON:  CT yesterday, CT 12/11/2018 also reviewed FINDINGS: Gaseous distension of ascending and transverse colon, similar to CT yesterday. Small volume of formed stool. More distal colon is collapsed and not well evaluated on radiograph. No evidence of free air on single supine view. No small bowel dilatation. Left renal calculi on prior CT are faintly visualized. IMPRESSION: Gaseous distension of the ascending and transverse colon, similar to CT yesterday, question external compression from splenic lesion. Small volume of formed stool within the distended colon. No small bowel dilatation or small bowel obstruction. Electronically Signed   By: Keith Rake M.D.   On: 12/24/2018 19:35    Microbiology: Recent Results (from the past 240 hour(s))  Urine culture     Status: None   Collection Time: 12/23/18  4:45 PM   Specimen: Urine, Random  Result Value Ref Range Status   Specimen Description   Final    URINE, RANDOM Performed at [facility],  [address], [place], [place] [address]    Special Requests   Final    NONE Performed at [facility], [place] [address] [place], [place] [address]    Culture   Final    NO GROWTH Performed at [facility], [place] [address], [place], [place] [address]    Report Status 12/24/2018 FINAL  Final     Labs: CBC: Recent Labs  Lab 12/23/18 1516 12/24/18 0658 12/25/18 0535 12/26/18 0805  WBC 13.8* 24.2* 15.5* 10.5  NEUTROABS 12.2*  --  13.4* 9.1*  HGB 12.0* 11.0* 9.5* 10.2*  HCT 36.9* 33.1* 28.8* 31.4*  MCV 92.5 92.2 92.3 93.5  PLT 369 369 282 [id number]   Basic Metabolic Panel: Recent Labs  Lab 12/23/18 1516 12/24/18 0658 12/25/18 0535 12/26/18 0805  NA 136 137 135 139  K 3.7 3.6 3.2* 3.5  CL 100 104 104 108  CO2 23 22 23  20*  GLUCOSE 125* 132* 108* 103*  BUN 29* 36* 45* 34*  CREATININE 2.19* 1.93*  1.88* 1.75*  CALCIUM 9.2 8.0* 7.3* 7.9*  MG  --   --  2.2 2.1   Liver Function Tests: Recent Labs  Lab 12/23/18 1516 12/24/18 0658 12/25/18 0535  AST 25 19 18   ALT 19 15 14   ALKPHOS 93 76 65  BILITOT 1.1 1.1 1.0  PROT 6.6 5.4* 4.8*  ALBUMIN 3.6 2.9* 2.6*   Recent Labs  Lab 12/23/18 1516  LIPASE 45   No results for input(s): AMMONIA in the last 168 hours. Cardiac Enzymes: No results for input(s): CKTOTAL, CKMB, CKMBINDEX, TROPONINI in the last 168 hours. BNP (last 3 results) No results for input(s): BNP in the last 8760 hours. CBG: No results for input(s): GLUCAP in the last 168 hours.  Time spent: 35 minutes  Signed:  Berle Mull  [facility] 12/26/2018 6:57 PM

## 2018-12-29 DIAGNOSIS — M5136 Other intervertebral disc degeneration, lumbar region: Secondary | ICD-10-CM | POA: Diagnosis not present

## 2018-12-29 DIAGNOSIS — I2510 Atherosclerotic heart disease of native coronary artery without angina pectoris: Secondary | ICD-10-CM | POA: Diagnosis not present

## 2018-12-29 DIAGNOSIS — I10 Essential (primary) hypertension: Secondary | ICD-10-CM | POA: Diagnosis not present

## 2018-12-29 DIAGNOSIS — N200 Calculus of kidney: Secondary | ICD-10-CM | POA: Diagnosis not present

## 2018-12-29 DIAGNOSIS — K5900 Constipation, unspecified: Secondary | ICD-10-CM | POA: Diagnosis not present

## 2018-12-29 DIAGNOSIS — K56609 Unspecified intestinal obstruction, unspecified as to partial versus complete obstruction: Secondary | ICD-10-CM | POA: Diagnosis not present

## 2018-12-29 DIAGNOSIS — R161 Splenomegaly, not elsewhere classified: Secondary | ICD-10-CM | POA: Diagnosis not present

## 2018-12-29 DIAGNOSIS — D649 Anemia, unspecified: Secondary | ICD-10-CM | POA: Diagnosis not present

## 2018-12-29 DIAGNOSIS — M47816 Spondylosis without myelopathy or radiculopathy, lumbar region: Secondary | ICD-10-CM | POA: Diagnosis not present

## 2018-12-29 DIAGNOSIS — I700 Atherosclerosis of aorta: Secondary | ICD-10-CM | POA: Diagnosis not present

## 2018-12-29 NOTE — Telephone Encounter (Signed)
Appointment scheduled.

## 2018-12-30 ENCOUNTER — Telehealth: Payer: Self-pay | Admitting: *Deleted

## 2018-12-30 ENCOUNTER — Telehealth: Payer: Self-pay | Admitting: Hematology

## 2018-12-30 NOTE — Telephone Encounter (Signed)
Scheduled appt per 10/6 sch message - pt son aware of appt date and time

## 2018-12-30 NOTE — Telephone Encounter (Signed)
Son called - asked if father's appt scheduled on 10/13 should be moved and be after biopsy on 10/15 so that MD will have those results.  Per Dr. Irene Limbo, please reschedule patient's appts to 3-4 days post biopsy. Contacted son [name] - appts for lab/MD currently on 10/13 will be cancelled. Schedule message sent to reschedule appts to 3-4 days post biopsy -- 10/19 or 10/20.

## 2018-12-31 ENCOUNTER — Encounter: Payer: Self-pay | Admitting: Internal Medicine

## 2018-12-31 NOTE — Progress Notes (Signed)
Subjective:    Patient ID: Ricardo Sloan, male    DOB: 1931-10-23, 85 y.o.   MRN: [id number]  HPI  He is here to re-establish with a new pcp.   The patient is here for follow up from the hospital.  He is here with his son.  Admitted 12/08/18 - 12/13/18 for hypercalcemia.  Sent to ED by PCP for worsening hypercalcemia and worsening kidney function.  He had some constipation, mild confusion and generalized weakness.work up reveled enlarged lung mass, splenic mass.  Oncology thought primary lung ca vs lymphoma  Admitted 12/23/18 - 12/26/18 for SBO, severe constipation.     He went to the hospital 9/29 for abdominal pain in the LUQ and epigastric region for 2-3 days.  He had associated N/V.  He had a BM the day prior and denied other symptoms.   Abdominal pain: Found to be related to constipation, obstruction from splenic mass Initially NPO, diet advanced and he tolerated regular diet GI consulted - no procedures necessary Continued on stool softeners  Nausea/vomiting, hiccups: Resolved  Splenic mass: Biopsy not an option due to risk of bleeding Oncology consulted IR who felt pt does not have any good bx sites Outpatient PET scan advised - this is scheduled  Hypertension: Stopped amlodipine 5 mg daily (possibly make constipation worse)  Cont metoprolol 25 mg daily  Hyperlipidemia: Cont pravastatin 20 mg   GERD Cont PPI  H/o hypercalcemia, kappa light chain disease: Following with oncology  Left sided hydronephrosis with renal stone: AKI Continue oral hydration at home Lasix prn only Following with urology   Home health on discharge: Nurse, PT    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  He has a PET-CT scan scheduled - this will evaluate the best biopsy target.  He will have a CT guided bone marrow biopsy to evaluate for evidence of lymphoma or cause for anemia.  He is anxious to  have an official diagnosis so that he can begin treatment.  Abdominal pain, constipation, obstruction from splenic mass: He is still feel constipated.  He is taking Colace three times a day (he was taking it twice a day, but just increased it to 3 times a day), miralax.  He has an intermittent burning in epigastric region.  No pattern to when the pain comes.  Not related to food.  Yesterday some burning in LUQ region. Also with lower abdomen pain for the past few days.  He has only had Two small BMs since he got home.  Eating minimal amounts of food, but eating.  He was thoroughly cleaned out in the hospital and likely did not have much in his system when he came home, but is concerned about being obstructed.  He is passing gas.    AKI, left sided hydronephrosis with renal stone:  Following with urology and nephrology.  He has an appointment with both in the next couple of weeks.  He denies any dysuria or hematuria.  He is trying to increase his fluids, but struggling to drink enough.  Hypertension, leg edema: He is taking his metoprolol daily as prescribed.  He denies chest pain, shortness of breath.  He still has leg edema.  He has been taking the Lasix on a daily basis.  He has intermittent palpitations that are transient.  Fatigue, decreased appetite: He is eating, but not much.  He is very fatigued and not able to do most of  the things he used to be able to do.  GERD: He is taking his medication daily.  He denies any heartburn.  Medications and allergies reviewed with patient and updated if appropriate.  Patient Active Problem List   Diagnosis Date Noted  . Lymphadenopathy   . Bowel obstruction ([place]) 12/23/2018  . Abdominal pain 12/23/2018  . Splenic mass 12/23/2018  . Edema 12/18/2018  . Hypercalcemia 12/05/2018  . Constipation 12/04/2018  . AKI (acute kidney injury) ([place]) 12/04/2018  . Decreased GFR 11/27/2018  . Hypotension 11/27/2018  . Fatigue 11/27/2018  . ASCVD  (arteriosclerotic cardiovascular disease) 08/06/2017  . Screening for prostate cancer 10/24/2015  . Pulmonary nodules/lesions, multiple 10/24/2015  . Prediabetes 10/21/2014  . Anemia 10/21/2014  . Abnormal stress test 08/26/2014  . Heart palpitations 08/26/2014  . Abnormal chest x-ray 10/15/2012  . Bronchiectasis without complication ([place]) 05/39/7673  . Mass of right lung 08/09/2009  . GERD 10/15/2008  . DYSPHAGIA 10/15/2008  . ERECTILE DYSFUNCTION 07/09/2007  . Mixed hyperlipidemia 07/15/2006  . Essential hypertension 07/15/2006    Current Outpatient Medications on File Prior to Visit  Medication Sig Dispense Refill  . Aspirin (ADULT ASPIRIN LOW STRENGTH) 81 MG EC tablet Take 81 mg by mouth daily.      Marland Kitchen docusate sodium (COLACE) 100 MG capsule Take 1 capsule (100 mg total) by mouth 2 (two) times daily. 10 capsule 0  . furosemide (LASIX) 20 MG tablet Take 1 tablet (20 mg total) by mouth daily as needed for fluid or edema. TAKE 1 TABLET(20 MG) BY MOUTH DAILY 90 tablet 0  . loratadine (CLARITIN) 10 MG tablet Take 10 mg by mouth daily.      . metoprolol succinate (TOPROL-XL) 25 MG 24 hr tablet Take 1 tablet (25 mg total) by mouth daily. 90 tablet 3  . Multiple Vitamin (MULTIVITAMIN) tablet Take 1 tablet by mouth daily.      Marland Kitchen omeprazole (PRILOSEC) 40 MG capsule Take 1 capsule (40 mg total) by mouth daily. 90 capsule 6  . ondansetron (ZOFRAN) 4 MG tablet Take 1 tablet (4 mg total) by mouth every 6 (six) hours as needed for nausea. 20 tablet 0  . polyethylene glycol powder (GLYCOLAX/MIRALAX) 17 GM/SCOOP powder Mix 1 scoop daily with water until constipation is relieved. 255 g 1  . pravastatin (PRAVACHOL) 40 MG tablet TAKE ONE-HALF TABLET BY MOUTH EVERY NIGHT AT BEDTIME (Patient taking differently: Take 20 mg by mouth at bedtime. ) 45 tablet 3  . Simethicone 80 MG TABS Take 1 tablet (80 mg total) by mouth 4 (four) times daily. 20 tablet 0  . vitamin B-12 (CYANOCOBALAMIN) 1000 MCG tablet Take  1,000 mcg by mouth daily.     No current facility-administered medications on file prior to visit.     Past Medical History:  Diagnosis Date  . BPH (benign prostatic hypertrophy)   . Colon polyps   . GERD (gastroesophageal reflux disease)    Upper endoscopy 2003, Dr. Velora Heckler  . Hemorrhoids   . Hiatal hernia 2010  . Hyperlipidemia   . Hypertension   . Internal hemorrhoids   . Microscopic hematuria 2001   Dr.  Luanne Bras  . Pneumonia     X 2 in high school    Past Surgical History:  Procedure Laterality Date  . COLONOSCOPY  [id number]   Internal hemorrhoids, Dr. Velora Heckler  . NOSE SURGERY     Submucosal resection in the 1960s  . TONSILLECTOMY        . UPPER GASTROINTESTINAL  ENDOSCOPY  2003   GERD  . WISDOM TOOTH EXTRACTION      Social History   Socioeconomic History  . Marital status: Married    Spouse name: Not on file  . Number of children: Not on file  . Years of education: Not on file  . Highest education level: Not on file  Occupational History  . Not on file  Social Needs  . Financial resource strain: Not on file  . Food insecurity    Worry: Not on file    Inability: Not on file  . Transportation needs    Medical: Not on file    Non-medical: Not on file  Tobacco Use  . Smoking status: Former Smoker    Packs/day: 0.75    Years: 13.00    Pack years: 9.75    Types: Cigarettes, Pipe    Quit date: 03/26/1965    Years since quitting: 53.8  . Smokeless tobacco: Never Used  . Tobacco comment: smoked 1953-1968, up to 1 ppd  Substance and Sexual Activity  . Alcohol use: Yes    Comment:  rarely socially; 1-2 / month  . Drug use: No  . Sexual activity: Not on file  Lifestyle  . Physical activity    Days per week: Not on file    Minutes per session: Not on file  . Stress: Not on file  Relationships  . Social Herbalist on phone: Not on file    Gets together: Not on file    Attends religious service: Not on file    Active member of club  or organization: Not on file    Attends meetings of clubs or organizations: Not on file    Relationship status: Not on file  Other Topics Concern  . Not on file  Social History Narrative   Daily caffeine    Daily care of wife; becoming more disabled; but still cooks;    Stress 1-10; 2      epworth sleepiness scale = 10 (11/02/2015)   Fun/[place]: [place]   Denies abuse and feels safe at home.     Family History  Problem Relation Age of Onset  . Diabetes Mother   . Subarachnoid hemorrhage Daughter   . Lung cancer Brother        smoker  . Other Paternal Grandfather        typhoid  . Heart disease Neg Hx   . Stroke Neg Hx   . Hypertension Neg Hx   . Colon cancer Neg Hx     Review of Systems  Constitutional: Positive for appetite change (decreased) and fatigue. Negative for chills and fever.  Respiratory: Negative for cough, shortness of breath and wheezing.   Cardiovascular: Positive for palpitations and leg swelling. Negative for chest pain.  Gastrointestinal: Positive for abdominal pain and constipation. Negative for blood in stool (no melena) and nausea.       No gerd  Genitourinary: Negative for dysuria.  Neurological: Negative for dizziness, light-headedness and headaches.       Objective:   Vitals:   01/01/19 0815  BP: 120/80  Pulse: 67  Temp: 97.8 F (36.6 C)  SpO2: 97%   BP Readings from Last 3 Encounters:  01/01/19 120/80  12/26/18 (!) 142/71  12/23/18 136/87   Wt Readings from Last 3 Encounters:  01/01/19 156 lb (70.8 kg)  12/26/18 150 lb 12.7 oz (68.4 kg)  12/23/18 157 lb (71.2 kg)   Body mass index is 23.04 kg/m.  Physical Exam    Constitutional: Chronically ill-appearing. No distress.  HENT:  Head: Normocephalic and atraumatic.  Neck: Neck supple. No tracheal deviation present. No thyromegaly present.  No cervical lymphadenopathy Cardiovascular: Normal rate, regular rhythm and normal heart sounds.  No murmur heard. No carotid bruit .  1+  bilateral pitting lower extremity edema Pulmonary/Chest: Effort normal.  No respiratory distress. No has no wheezes.  Mild bibasilar crackles Abdomen: soft, nondistended, slight tenderness left upper quadrant and periumbilical region.  No rebound or guarding Skin: Skin is warm and dry. Not diaphoretic.  Psychiatric: Normal mood and affect. Behavior is normal.    Lab Results  Component Value Date   WBC 10.5 12/26/2018   HGB 10.2 (L) 12/26/2018   HCT 31.4 (L) 12/26/2018   PLT 315 12/26/2018   GLUCOSE 103 (H) 12/26/2018   CHOL 139 01/27/2018   TRIG 76.0 01/27/2018   HDL 43.40 01/27/2018   LDLCALC 80 01/27/2018   ALT 14 12/25/2018   AST 18 12/25/2018   NA 139 12/26/2018   K 3.5 12/26/2018   CL 108 12/26/2018   CREATININE 1.75 (H) 12/26/2018   BUN 34 (H) 12/26/2018   CO2 20 (L) 12/26/2018   TSH 2.74 08/13/2017   PSA 0.98 08/13/2017   INR 1.0 ratio 07/28/2009   HGBA1C 6.1 06/15/2014   MICROALBUR <0.7 08/13/2017    US RENAL CLINICAL DATA:  Followup hydronephrosis  EXAM: RENAL / URINARY TRACT ULTRASOUND COMPLETE  COMPARISON:  CT 12/23/2018, ultrasound 12/10/2018  FINDINGS: Right Kidney:  Renal measurements: 10.3 x 4.5 x 5.2 cm = volume: 127 mL . Echogenicity within normal limits. No mass or hydronephrosis visualized.  Left Kidney:  Renal measurements: 9.7 x 5.5 x 4.9 cm = volume: 136 mL. Echogenicity within normal limits. Moderate hydronephrosis. Scattered echogenic foci within the left kidney compatible with known renal calculi.  Bladder:  Bladder is mildly distended and appears within normal limits. Right ureteral jet was seen. No left ureteral jet was visualized during the course of the examination.  Other: Small volume ascites. Irregular splenic mass, better characterized on recent CT.  IMPRESSION: 1. Left-sided nephrolithiasis with moderate left hydronephrosis. No left-sided ureteral jet was seen. 2. Small volume ascites. 3. Partially visualized  splenic mass.  Electronically Signed   By: Davina Poke M.D.   On: 12/25/2018 11:34    Assessment & Plan:    See Problem List for Assessment and Plan of chronic medical problems.

## 2019-01-01 ENCOUNTER — Ambulatory Visit (INDEPENDENT_AMBULATORY_CARE_PROVIDER_SITE_OTHER)
Admission: RE | Admit: 2019-01-01 | Discharge: 2019-01-01 | Disposition: A | Discharge status: Home / Self Care | Payer: Medicare HMO | Source: Ambulatory Visit | Attending: Internal Medicine | Admitting: Internal Medicine

## 2019-01-01 ENCOUNTER — Ambulatory Visit (INDEPENDENT_AMBULATORY_CARE_PROVIDER_SITE_OTHER): Payer: Medicare HMO | Admitting: Internal Medicine

## 2019-01-01 ENCOUNTER — Other Ambulatory Visit: Payer: Self-pay

## 2019-01-01 ENCOUNTER — Encounter: Payer: Self-pay | Admitting: Internal Medicine

## 2019-01-01 VITALS — BP 120/80 | HR 67 | Temp 97.8°F | Ht 69.0 in | Wt 156.0 lb

## 2019-01-01 DIAGNOSIS — N179 Acute kidney failure, unspecified: Secondary | ICD-10-CM | POA: Diagnosis not present

## 2019-01-01 DIAGNOSIS — K56699 Other intestinal obstruction unspecified as to partial versus complete obstruction: Secondary | ICD-10-CM

## 2019-01-01 DIAGNOSIS — K219 Gastro-esophageal reflux disease without esophagitis: Secondary | ICD-10-CM | POA: Diagnosis not present

## 2019-01-01 DIAGNOSIS — I10 Essential (primary) hypertension: Secondary | ICD-10-CM | POA: Diagnosis not present

## 2019-01-01 DIAGNOSIS — K5900 Constipation, unspecified: Secondary | ICD-10-CM

## 2019-01-01 DIAGNOSIS — R609 Edema, unspecified: Secondary | ICD-10-CM

## 2019-01-01 DIAGNOSIS — R161 Splenomegaly, not elsewhere classified: Secondary | ICD-10-CM

## 2019-01-01 DIAGNOSIS — R1012 Left upper quadrant pain: Secondary | ICD-10-CM | POA: Diagnosis not present

## 2019-01-01 MED ORDER — SENNA 8.6 MG PO TABS: 2.0000 | ORAL_TABLET | Freq: Every day | ORAL | 1 refills | Status: AC

## 2019-01-01 NOTE — Assessment & Plan Note (Signed)
Blood pressure looks good here today Continue metoprolol at current dose

## 2019-01-01 NOTE — Assessment & Plan Note (Signed)
Likely primary lung ca or lymphoma Following Dr Irene Limbo Has PET Ct  And bone marrow bx scheduled to determine diagnosis  - he is anxious to start treatment

## 2019-01-01 NOTE — Assessment & Plan Note (Signed)
GERD controlled Continue daily omeprazole

## 2019-01-01 NOTE — Assessment & Plan Note (Signed)
Multifactorial Taking lasix daily - advised to take prn Increase protein intake Has nephology appt

## 2019-01-01 NOTE — Assessment & Plan Note (Signed)
Bowel obstruction secondary to large splenic mass-admitted and improved with conservative measures

## 2019-01-01 NOTE — Assessment & Plan Note (Signed)
?   Related to constipation or lymphadenopathy/splenic mass KUB today to rule out constipation Continue aggressive bowel regimen

## 2019-01-01 NOTE — Assessment & Plan Note (Signed)
Taking colace 3 times a day - continue Continue miralax Start sennokot Will get KUB to make sure he is not severely constipation -- it may just be the fact that he is not eating much so BM have been less

## 2019-01-01 NOTE — Patient Instructions (Addendum)
  Have an x-ray today.    Medications reviewed and updated.  Changes include :   Start sennokot for your constipation.  Your prescription(s) have been submitted to your pharmacy. Please take as directed and contact our office if you believe you are having problem(s) with the medication(s).

## 2019-01-01 NOTE — Assessment & Plan Note (Signed)
Kidney function improved before leaving the hospital Trying to increase his fluids Has appt with nephrology soon so we will hold off on rechecking labs today

## 2019-01-02 ENCOUNTER — Ambulatory Visit: Payer: Medicare HMO | Admitting: Family Medicine

## 2019-01-02 DIAGNOSIS — K56609 Unspecified intestinal obstruction, unspecified as to partial versus complete obstruction: Secondary | ICD-10-CM | POA: Diagnosis not present

## 2019-01-02 DIAGNOSIS — I700 Atherosclerosis of aorta: Secondary | ICD-10-CM | POA: Diagnosis not present

## 2019-01-02 DIAGNOSIS — M5136 Other intervertebral disc degeneration, lumbar region: Secondary | ICD-10-CM | POA: Diagnosis not present

## 2019-01-02 DIAGNOSIS — D649 Anemia, unspecified: Secondary | ICD-10-CM | POA: Diagnosis not present

## 2019-01-02 DIAGNOSIS — K5900 Constipation, unspecified: Secondary | ICD-10-CM | POA: Diagnosis not present

## 2019-01-02 DIAGNOSIS — N200 Calculus of kidney: Secondary | ICD-10-CM | POA: Diagnosis not present

## 2019-01-02 DIAGNOSIS — I2510 Atherosclerotic heart disease of native coronary artery without angina pectoris: Secondary | ICD-10-CM | POA: Diagnosis not present

## 2019-01-02 DIAGNOSIS — I10 Essential (primary) hypertension: Secondary | ICD-10-CM | POA: Diagnosis not present

## 2019-01-02 DIAGNOSIS — R161 Splenomegaly, not elsewhere classified: Secondary | ICD-10-CM | POA: Diagnosis not present

## 2019-01-02 DIAGNOSIS — M47816 Spondylosis without myelopathy or radiculopathy, lumbar region: Secondary | ICD-10-CM | POA: Diagnosis not present

## 2019-01-05 ENCOUNTER — Ambulatory Visit (HOSPITAL_COMMUNITY)
Admission: RE | Admit: 2019-01-05 | Discharge: 2019-01-05 | Disposition: A | Discharge status: Home / Self Care | Payer: Medicare HMO | Source: Ambulatory Visit | Attending: Hematology | Admitting: Hematology

## 2019-01-05 ENCOUNTER — Other Ambulatory Visit: Payer: Self-pay

## 2019-01-05 DIAGNOSIS — R590 Localized enlarged lymph nodes: Secondary | ICD-10-CM

## 2019-01-05 DIAGNOSIS — N1330 Unspecified hydronephrosis: Secondary | ICD-10-CM | POA: Insufficient documentation

## 2019-01-05 DIAGNOSIS — R1902 Left upper quadrant abdominal swelling, mass and lump: Secondary | ICD-10-CM | POA: Diagnosis not present

## 2019-01-05 DIAGNOSIS — I2510 Atherosclerotic heart disease of native coronary artery without angina pectoris: Secondary | ICD-10-CM | POA: Diagnosis not present

## 2019-01-05 DIAGNOSIS — R161 Splenomegaly, not elsewhere classified: Secondary | ICD-10-CM | POA: Diagnosis not present

## 2019-01-05 DIAGNOSIS — C8590 Non-Hodgkin lymphoma, unspecified, unspecified site: Secondary | ICD-10-CM | POA: Diagnosis not present

## 2019-01-05 DIAGNOSIS — I700 Atherosclerosis of aorta: Secondary | ICD-10-CM | POA: Insufficient documentation

## 2019-01-05 DIAGNOSIS — J320 Chronic maxillary sinusitis: Secondary | ICD-10-CM | POA: Insufficient documentation

## 2019-01-05 DIAGNOSIS — N200 Calculus of kidney: Secondary | ICD-10-CM | POA: Diagnosis not present

## 2019-01-05 DIAGNOSIS — R918 Other nonspecific abnormal finding of lung field: Secondary | ICD-10-CM

## 2019-01-05 DIAGNOSIS — R188 Other ascites: Secondary | ICD-10-CM | POA: Insufficient documentation

## 2019-01-05 DIAGNOSIS — N289 Disorder of kidney and ureter, unspecified: Secondary | ICD-10-CM | POA: Diagnosis not present

## 2019-01-05 DIAGNOSIS — J90 Pleural effusion, not elsewhere classified: Secondary | ICD-10-CM | POA: Diagnosis not present

## 2019-01-05 LAB — GLUCOSE, CAPILLARY: Glucose-Capillary: 108 mg/dL — ABNORMAL HIGH (ref 70–99)

## 2019-01-05 MED ORDER — FLUDEOXYGLUCOSE F - 18 (FDG) INJECTION
8.0000 | Freq: Once | INTRAVENOUS | Status: AC
  Administered 2019-01-05: 8 via INTRAVENOUS

## 2019-01-06 ENCOUNTER — Telehealth: Payer: Self-pay | Admitting: Internal Medicine

## 2019-01-06 ENCOUNTER — Ambulatory Visit: Payer: Medicare HMO | Admitting: Hematology

## 2019-01-06 ENCOUNTER — Other Ambulatory Visit: Payer: Medicare HMO

## 2019-01-06 MED ORDER — BISACODYL EC 5 MG PO TBEC: 5.0000 mg | DELAYED_RELEASE_TABLET | Freq: Every day | ORAL | 0 refills | Status: DC | PRN

## 2019-01-06 NOTE — Telephone Encounter (Signed)
Patient's wife informed below. Verbalized understanding.

## 2019-01-06 NOTE — Telephone Encounter (Signed)
Patient requesting lab results. Was disconnected by the agent on transfer to NT. TC to patient left VM to return all for labs.

## 2019-01-06 NOTE — Telephone Encounter (Signed)
Pt is calling in because he was prescribed senna (SENOKOT) 8.6 MG TABS tablet  And also docusate sodium (COLACE) 100 MG capsule. Pt says that these medications are not helping. Pt would like to be advised further.    Spouse said in the back ground that pt also hasn't been eating a lot.    CB: [phone]

## 2019-01-06 NOTE — Telephone Encounter (Signed)
He does have some stool in his right colon - there is partial obstruction from the spleen mass so we need to keep the stool as soft as possible so continue the current medications.  If he is not eating as much he will have less bowel movements.    Some of his abdominal discomfort is probably from the spleen mass.    We can try a bisacodyl to see if that helps -- pending - this is a laxative.  We do not want to use this too much, try it to see if it helps.  It is ok to take on occasion.

## 2019-01-07 ENCOUNTER — Other Ambulatory Visit: Payer: Self-pay | Admitting: Radiology

## 2019-01-07 ENCOUNTER — Telehealth: Payer: Self-pay | Admitting: *Deleted

## 2019-01-07 DIAGNOSIS — M47816 Spondylosis without myelopathy or radiculopathy, lumbar region: Secondary | ICD-10-CM | POA: Diagnosis not present

## 2019-01-07 DIAGNOSIS — I700 Atherosclerosis of aorta: Secondary | ICD-10-CM | POA: Diagnosis not present

## 2019-01-07 DIAGNOSIS — I10 Essential (primary) hypertension: Secondary | ICD-10-CM | POA: Diagnosis not present

## 2019-01-07 DIAGNOSIS — K5900 Constipation, unspecified: Secondary | ICD-10-CM | POA: Diagnosis not present

## 2019-01-07 DIAGNOSIS — N200 Calculus of kidney: Secondary | ICD-10-CM | POA: Diagnosis not present

## 2019-01-07 DIAGNOSIS — K56609 Unspecified intestinal obstruction, unspecified as to partial versus complete obstruction: Secondary | ICD-10-CM | POA: Diagnosis not present

## 2019-01-07 DIAGNOSIS — D649 Anemia, unspecified: Secondary | ICD-10-CM | POA: Diagnosis not present

## 2019-01-07 DIAGNOSIS — M5136 Other intervertebral disc degeneration, lumbar region: Secondary | ICD-10-CM | POA: Diagnosis not present

## 2019-01-07 DIAGNOSIS — I2510 Atherosclerotic heart disease of native coronary artery without angina pectoris: Secondary | ICD-10-CM | POA: Diagnosis not present

## 2019-01-07 DIAGNOSIS — R161 Splenomegaly, not elsewhere classified: Secondary | ICD-10-CM | POA: Diagnosis not present

## 2019-01-07 NOTE — Telephone Encounter (Signed)
Records faxed to [facility] - release [id number]

## 2019-01-08 ENCOUNTER — Ambulatory Visit (HOSPITAL_COMMUNITY)
Admission: RE | Admit: 2019-01-08 | Discharge: 2019-01-08 | Disposition: A | Discharge status: Home / Self Care | Payer: Medicare HMO | Source: Ambulatory Visit | Attending: Hematology | Admitting: Hematology

## 2019-01-08 ENCOUNTER — Other Ambulatory Visit: Payer: Self-pay

## 2019-01-08 ENCOUNTER — Encounter (HOSPITAL_COMMUNITY): Payer: Self-pay

## 2019-01-08 ENCOUNTER — Other Ambulatory Visit: Payer: Self-pay | Admitting: Internal Medicine

## 2019-01-08 DIAGNOSIS — R591 Generalized enlarged lymph nodes: Secondary | ICD-10-CM | POA: Diagnosis not present

## 2019-01-08 DIAGNOSIS — K449 Diaphragmatic hernia without obstruction or gangrene: Secondary | ICD-10-CM | POA: Diagnosis not present

## 2019-01-08 DIAGNOSIS — E785 Hyperlipidemia, unspecified: Secondary | ICD-10-CM | POA: Insufficient documentation

## 2019-01-08 DIAGNOSIS — D696 Thrombocytopenia, unspecified: Secondary | ICD-10-CM | POA: Diagnosis not present

## 2019-01-08 DIAGNOSIS — D473 Essential (hemorrhagic) thrombocythemia: Secondary | ICD-10-CM | POA: Diagnosis not present

## 2019-01-08 DIAGNOSIS — I10 Essential (primary) hypertension: Secondary | ICD-10-CM | POA: Insufficient documentation

## 2019-01-08 DIAGNOSIS — N400 Enlarged prostate without lower urinary tract symptoms: Secondary | ICD-10-CM | POA: Insufficient documentation

## 2019-01-08 DIAGNOSIS — R590 Localized enlarged lymph nodes: Secondary | ICD-10-CM | POA: Diagnosis not present

## 2019-01-08 DIAGNOSIS — K219 Gastro-esophageal reflux disease without esophagitis: Secondary | ICD-10-CM | POA: Diagnosis not present

## 2019-01-08 DIAGNOSIS — R161 Splenomegaly, not elsewhere classified: Secondary | ICD-10-CM | POA: Insufficient documentation

## 2019-01-08 DIAGNOSIS — Z87891 Personal history of nicotine dependence: Secondary | ICD-10-CM | POA: Insufficient documentation

## 2019-01-08 DIAGNOSIS — Z79899 Other long term (current) drug therapy: Secondary | ICD-10-CM | POA: Insufficient documentation

## 2019-01-08 DIAGNOSIS — Z7982 Long term (current) use of aspirin: Secondary | ICD-10-CM | POA: Diagnosis not present

## 2019-01-08 DIAGNOSIS — R918 Other nonspecific abnormal finding of lung field: Secondary | ICD-10-CM

## 2019-01-08 DIAGNOSIS — D649 Anemia, unspecified: Secondary | ICD-10-CM | POA: Diagnosis not present

## 2019-01-08 DIAGNOSIS — K5900 Constipation, unspecified: Secondary | ICD-10-CM

## 2019-01-08 LAB — CBC WITH DIFFERENTIAL/PLATELET
Abs Immature Granulocytes: 0.04 10*3/uL (ref 0.00–0.07)
Basophils Absolute: 0.1 10*3/uL (ref 0.0–0.1)
Basophils Relative: 1 %
Eosinophils Absolute: 0.1 10*3/uL (ref 0.0–0.5)
Eosinophils Relative: 2 %
HCT: 33.3 % — ABNORMAL LOW (ref 39.0–52.0)
Hemoglobin: 10.7 g/dL — ABNORMAL LOW (ref 13.0–17.0)
Immature Granulocytes: 1 %
Lymphocytes Relative: 11 %
Lymphs Abs: 0.7 10*3/uL (ref 0.7–4.0)
MCH: 29.6 pg (ref 26.0–34.0)
MCHC: 32.1 g/dL (ref 30.0–36.0)
MCV: 92.2 fL (ref 80.0–100.0)
Monocytes Absolute: 0.8 10*3/uL (ref 0.1–1.0)
Monocytes Relative: 13 %
Neutro Abs: 4.8 10*3/uL (ref 1.7–7.7)
Neutrophils Relative %: 72 %
Platelets: 415 10*3/uL — ABNORMAL HIGH (ref 150–400)
RBC: 3.61 MIL/uL — ABNORMAL LOW (ref 4.22–5.81)
RDW: 14.9 % (ref 11.5–15.5)
WBC: 6.7 10*3/uL (ref 4.0–10.5)
nRBC: 0 % (ref 0.0–0.2)

## 2019-01-08 LAB — BASIC METABOLIC PANEL
Anion gap: 11 (ref 5–15)
BUN: 29 mg/dL — ABNORMAL HIGH (ref 8–23)
CO2: 20 mmol/L — ABNORMAL LOW (ref 22–32)
Calcium: 9.4 mg/dL (ref 8.9–10.3)
Chloride: 105 mmol/L (ref 98–111)
Creatinine, Ser: 1.51 mg/dL — ABNORMAL HIGH (ref 0.61–1.24)
GFR calc Af Amer: 48 mL/min — ABNORMAL LOW (ref >60)
GFR calc non Af Amer: 42 mL/min — ABNORMAL LOW (ref >60)
Glucose, Bld: 110 mg/dL — ABNORMAL HIGH (ref 70–99)
Potassium: 3.9 mmol/L (ref 3.5–5.1)
Sodium: 136 mmol/L (ref 135–145)

## 2019-01-08 LAB — PROTIME-INR
INR: 1.1 (ref 0.8–1.2)
Prothrombin Time: 13.6 seconds (ref 11.4–15.2)

## 2019-01-08 MED ORDER — MIDAZOLAM HCL 2 MG/2ML IJ SOLN
INTRAMUSCULAR | Status: AC
  Filled 2019-01-08: qty 4

## 2019-01-08 MED ORDER — FENTANYL CITRATE (PF) 100 MCG/2ML IJ SOLN
INTRAMUSCULAR | Status: AC | PRN
  Administered 2019-01-08: 50 ug via INTRAVENOUS

## 2019-01-08 MED ORDER — LIDOCAINE HCL (PF) 1 % IJ SOLN
INTRAMUSCULAR | Status: AC | PRN
  Administered 2019-01-08: 10 mL

## 2019-01-08 MED ORDER — SODIUM CHLORIDE 0.9 % IV SOLN
INTRAVENOUS | Status: DC
  Administered 2019-01-08: 10:00:00 via INTRAVENOUS

## 2019-01-08 MED ORDER — MIDAZOLAM HCL 2 MG/2ML IJ SOLN
INTRAMUSCULAR | Status: AC | PRN
  Administered 2019-01-08: 1 mg via INTRAVENOUS
  Administered 2019-01-08: 0.5 mg via INTRAVENOUS

## 2019-01-08 MED ORDER — FENTANYL CITRATE (PF) 100 MCG/2ML IJ SOLN
INTRAMUSCULAR | Status: AC
  Filled 2019-01-08: qty 2

## 2019-01-08 NOTE — Consult Note (Signed)
Chief Complaint: Patient was seen in consultation today for anemia/bone marrow biopsy and aspiration.  Referring Physician(s): Brunetta Genera  Supervising Physician: Aletta Edouard  Patient Status: [facility] - In-pt  History of Present Illness: Ricardo Sloan is a 85 y.o. male with a past medical history of hypertension, hyperlipidemia, pneumonia, GERD, hiatal hernia, and BPH. He was recently admitted to [facility] 12/23/2018 to 12/26/2018 for management of gradual onset of abdominal pain x days. While admitted, CT abdomen/pelvis revealed a necrotic splenic mass along with retroperitoneal lymphadenopathy. Of note, patient also had work-up for hypercalcemia, and CT chest revealed multiple lung nodules. Also during admission, IR was consulted for possible retroperitoneal lymph node biopsy, however there was no safe area to biopsy (close proximity to the left ureter/UPJ/renal vein). After discharge, he followed-up with oncology OP. Surgical biopsies were recommended, however patient declined as he desires to avoid a surgical laparoscopic biopsy and related anesthesia. Given Ct findings along with anemia, it was recommended that patient undergo bone marrow biopsy/aspiration to R/O lymphoma.  CT abdomen/pelvis 12/23/2018: 1. 10 cm irregular mass centered in the medial spleen with abdominal/retroperitoneal adenopathy highly suspicious for malignancy with metastatic spread. Small amount of ascites. 2. Very large amount of stool within the proximal transverse colon with relative colonic obstruction at the splenic flexure likely from the irregular splenic mass. No pneumoperitoneum. 3. Moderate LEFT hydronephrosis and LEFT renal calculi again noted. 4. Unchanged small bilateral pleural effusions and bibasilar atelectasis. 5.  Aortic Atherosclerosis (ICD10-I70.0).  IR consulted by Dr. Irene Limbo for possible image-guided bone marrow biopsy/aspiration. Patient awake and alert laying in bed. Complains of abdominal  pain, stable at this time. Denies fever, chills, chest pain, dyspnea, or headache.   Past Medical History:  Diagnosis Date   BPH (benign prostatic hypertrophy)    Colon polyps    GERD (gastroesophageal reflux disease)    Upper endoscopy 2003, Dr. Velora Heckler   Hemorrhoids    Hiatal hernia 2010   Hyperlipidemia    Hypertension    Internal hemorrhoids    Microscopic hematuria 2001   Dr.  Sherrye Payor Kimbrough   Pneumonia     X 2 in high school    Past Surgical History:  Procedure Laterality Date   COLONOSCOPY  2001,2006   Internal hemorrhoids, Dr. Velora Heckler   NOSE SURGERY     Submucosal resection in the [place] [facility]  2003   GERD   WISDOM TOOTH EXTRACTION      Allergies: Patient has no known allergies.  Medications: Prior to Admission medications   Medication Sig Start Date End Date Taking? Authorizing Provider  Aspirin (ADULT ASPIRIN LOW STRENGTH) 81 MG EC tablet Take 81 mg by mouth daily.      [provider]  bisacodyl (BISACODYL) 5 MG EC tablet Take 1 tablet (5 mg total) by mouth daily as needed for moderate constipation. 01/06/19   Binnie Rail, MD  docusate sodium (COLACE) 100 MG capsule Take 1 capsule (100 mg total) by mouth 2 (two) times daily. 12/26/18   Lavina Hamman, MD  furosemide (LASIX) 20 MG tablet Take 1 tablet (20 mg total) by mouth daily as needed for fluid or edema. TAKE 1 TABLET(20 MG) BY MOUTH DAILY 12/26/18   Lavina Hamman, MD  loratadine (CLARITIN) 10 MG tablet Take 10 mg by mouth daily.      [provider]  metoprolol succinate (TOPROL-XL) 25 MG 24 hr tablet Take 1  tablet (25 mg total) by mouth daily. 01/27/18   Libby Maw, MD  Multiple Vitamin (MULTIVITAMIN) tablet Take 1 tablet by mouth daily.      [provider]  omeprazole (PRILOSEC) 40 MG capsule Take 1 capsule (40 mg total) by mouth daily. 01/27/18   Libby Maw, MD  ondansetron  (ZOFRAN) 4 MG tablet Take 1 tablet (4 mg total) by mouth every 6 (six) hours as needed for nausea. 12/13/18   Mariel Aloe, MD  polyethylene glycol powder (GLYCOLAX/MIRALAX) 17 GM/SCOOP powder Mix 1 scoop daily with water until constipation is relieved. 12/04/18   Libby Maw, MD  pravastatin (PRAVACHOL) 40 MG tablet TAKE ONE-HALF TABLET BY MOUTH EVERY NIGHT AT BEDTIME Patient taking differently: Take 20 mg by mouth at bedtime.  01/27/18   Libby Maw, MD  senna (SENOKOT) 8.6 MG TABS tablet Take 2 tablets (17.2 mg total) by mouth at bedtime. 01/01/19   Binnie Rail, MD  Simethicone 80 MG TABS Take 1 tablet (80 mg total) by mouth 4 (four) times daily. 12/26/18   Lavina Hamman, MD  vitamin B-12 (CYANOCOBALAMIN) 1000 MCG tablet Take 1,000 mcg by mouth daily.    [provider]     Family History  Problem Relation Age of Onset   Diabetes Mother    Subarachnoid hemorrhage Daughter    Lung cancer Brother        smoker   Other Paternal Grandfather        typhoid   Heart disease Neg Hx    Stroke Neg Hx    Hypertension Neg Hx    Colon cancer Neg Hx     Social History   Socioeconomic History   Marital status: Married    Spouse name: Not on file   Number of children: Not on file   Years of education: Not on file   Highest education level: Not on file  Occupational History   Not on file  Social Needs   Financial resource strain: Not on file   Food insecurity    Worry: Not on file    Inability: Not on file   Transportation needs    Medical: Not on file    Non-medical: Not on file  Tobacco Use   Smoking status: Former Smoker    Packs/day: 0.75    Years: 13.00    Pack years: 9.75    Types: Cigarettes, Pipe    Quit date: 03/26/1965    Years since quitting: 53.8   Smokeless tobacco: Never Used   Tobacco comment: smoked 1953-1968, up to 1 ppd  Substance and Sexual Activity   Alcohol use: Yes    Comment:  rarely socially; 1-2 /  month   Drug use: No   Sexual activity: Not on file  Lifestyle   Physical activity    Days per week: Not on file    Minutes per session: Not on file   Stress: Not on file  Relationships   Social connections    Talks on phone: Not on file    Gets together: Not on file    Attends religious service: Not on file    Active member of club or organization: Not on file    Attends meetings of clubs or organizations: Not on file    Relationship status: Not on file  Other Topics Concern   Not on file  Social History Narrative   Daily caffeine    Daily care of wife; becoming more disabled;  but still cooks;    Stress 1-10; 2      epworth sleepiness scale = 10 (11/02/2015)   Fun/[place]: Garden   Denies abuse and feels safe at home.      Review of Systems: A 12 point ROS discussed and pertinent positives are indicated in the HPI above.  All other systems are negative.  Review of Systems  Constitutional: Negative for chills and fever.  Respiratory: Negative for shortness of breath and wheezing.   Cardiovascular: Negative for chest pain and palpitations.  Gastrointestinal: Positive for abdominal pain.  Neurological: Negative for headaches.  Psychiatric/Behavioral: Negative for behavioral problems and confusion.    Vital Signs: BP (!) 135/91 (BP Location: Left Arm)    Pulse (!) 113    Temp 98.3 F (36.8 C) (Oral)    Resp 18    SpO2 98%   Physical Exam Vitals signs and nursing note reviewed.  Constitutional:      General: He is not in acute distress.    Appearance: Normal appearance.  Cardiovascular:     Rate and Rhythm: Regular rhythm. Tachycardia present.     Heart sounds: Normal heart sounds. No murmur.  Pulmonary:     Effort: Pulmonary effort is normal. No respiratory distress.     Breath sounds: Normal breath sounds. No wheezing.  Skin:    General: Skin is warm and dry.  Neurological:     Mental Status: He is alert and oriented to person, place, and time.  Psychiatric:         Mood and Affect: Mood normal.        Behavior: Behavior normal.        Thought Content: Thought content normal.        Judgment: Judgment normal.      MD Evaluation Airway: WNL Heart: WNL Abdomen: WNL Chest/ Lungs: WNL ASA  Classification: 3 Mallampati/Airway Score: Two   Imaging: Ct Abdomen Pelvis Wo Contrast  Result Date: 12/23/2018 CLINICAL DATA:  85 year old male with acute abdominal and pelvic pain with distension. EXAM: CT ABDOMEN AND PELVIS WITHOUT CONTRAST TECHNIQUE: Multidetector CT imaging of the abdomen and pelvis was performed following the standard protocol without IV contrast. COMPARISON:  12/11/2018 FINDINGS: Please note that parenchymal abnormalities may be missed without intravenous contrast. Lower chest: Small bilateral pleural effusions and mild bibasilar atelectasis again noted. Hepatobiliary: The liver and gallbladder are unremarkable except for a stable hepatic cyst. No biliary dilatation. Pancreas: No definite abnormality Spleen: Heterogeneous enlarged spleen noted with irregular 10 cm hypoechoic area along the splenic hilum is worrisome for malignancy. Adrenals/Urinary Tract: Bilateral renal atrophy, moderate LEFT hydronephrosis and LEFT renal calculi again noted. The adrenal glands are unremarkable. Bladder is unremarkable. Stomach/Bowel: A very large amount of stool is noted within the proximal and transverse colon. There is a transition point at the splenic flexure at the irregular splenic mass with collapsed descending and sigmoid colon. No other definite bowel abnormalities are noted. Vascular/Lymphatic: Aortic atherosclerosis. Enlarged retroperitoneal/iliac lymph nodes are noted with the largest lymph node measuring 3.2 cm (series 2: Image 20). Reproductive: Mild prostate enlargement noted. Other: A small amount of ascites within the abdomen and pelvis noted. No pneumoperitoneum. Musculoskeletal: No acute or suspicious bony abnormalities are noted. Multilevel  degenerative disc disease, spondylosis and facet arthropathy within the lumbar spine noted. IMPRESSION: 1. 10 cm irregular mass centered in the medial spleen with abdominal/retroperitoneal adenopathy highly suspicious for malignancy with metastatic spread. Small amount of ascites. 2. Very large amount of stool within the  proximal transverse colon with relative colonic obstruction at the splenic flexure likely from the irregular splenic mass. No pneumoperitoneum. 3. Moderate LEFT hydronephrosis and LEFT renal calculi again noted. 4. Unchanged small bilateral pleural effusions and bibasilar atelectasis. 5.  Aortic Atherosclerosis (ICD10-I70.0). Electronically Signed   By: Margarette Canada M.D.   On: 12/23/2018 20:05   Ct Abdomen Pelvis Wo Contrast  Result Date: 12/11/2018 CLINICAL DATA:  Known left-sided hydronephrosis and chronic renal disease EXAM: CT CHEST, ABDOMEN AND PELVIS WITHOUT CONTRAST TECHNIQUE: Multidetector CT imaging of the chest, abdomen and pelvis was performed following the standard protocol without IV contrast. COMPARISON:  04/10/2017 FINDINGS: CT CHEST FINDINGS Cardiovascular: Thoracic aorta demonstrates atherosclerotic calcifications without aneurysmal dilatation. Coronary calcifications are noted. No cardiac enlargement is seen. Mediastinum/Nodes: Thoracic inlet is within normal limits. No hilar or mediastinal adenopathy is noted. The esophagus as visualized is within normal limits. Lungs/Pleura: Lungs are well aerated bilaterally with mild emphysematous changes. Small bilateral pleural effusions are seen left greater than right. Mild left lower lobe atelectasis is noted. Scarring in the right middle lobe and lingula on the left are again seen and stable. Multiple nodules are noted throughout both lungs increased in appearance when compared with the prior exam. This is most notable in the right upper lobe where a 10 mm nodule is noted best seen on image number 57 of series 4. Scattered smaller  nodules are seen. Previously noted ground-glass attenuation area in the posterior right upper lobe has improved but demonstrates some residual nodular changes. The remainder of the nodular densities measure less than 5 mm. Musculoskeletal: Degenerative changes of the thoracic spine are noted. No lytic or sclerotic lesions are seen. Extrapleural soft tissue densities are noted along the left lateral aspect of the sternum stable in appearance from the prior exam from 2019 again likely benign in etiology. CT ABDOMEN PELVIS FINDINGS Hepatobiliary: Somewhat limited due to lack of IV contrast. Stable hepatic cyst is noted. The gallbladder is within normal limits. Pancreas: Atrophic changes of the pancreas are seen. Spleen: Spleen is enlarged in size. Evaluation is somewhat limited due to lack of IV contrast. A mixed attenuation lesion is identified which measures at least 10 cm in greatest dimension. This would correspond with that seen on prior ultrasound examination. In retrospect, this was likely present on the prior exam of 2019 but again incompletely evaluated because of lack of IV contrast. Previously it measured just over 5 cm. Adrenals/Urinary Tract: Adrenal glands are within normal limits. The right kidney demonstrates no focal abnormality. The left kidney demonstrates some renal calculi. Left-sided hydronephrosis is seen although the ureter appears to be within normal limits. No ureteral calculi are noted. The bladder is well distended. Stomach/Bowel: The appendix is within normal limits. Colon is filled with fecal material although no obstructive changes are seen. Small bowel and stomach appear within normal limits. Vascular/Lymphatic: Aortic atherosclerosis. No enlarged abdominal or pelvic lymph nodes. Reproductive: Prostate is unremarkable. Other: Free fluid is noted within the pelvis. Free fluid also surrounds the liver. Musculoskeletal: Degenerative changes of the lumbar spine are seen. No lytic or  sclerotic lesions are noted. IMPRESSION: CT of the chest: Scattered nodular densities bilaterally which have increased in the interval from the prior exam particularly in the right upper lobe where a 10 mm mildly spiculated nodule is seen as described. Small pleural effusions are noted as well. Outpatient PET-CT may be helpful for further evaluation. Soft tissue densities are noted along the left lateral aspect of the sternum stable  in appearance from the prior exam and likely benign in etiology. CT of the abdomen and pelvis: Mixed attenuation mass lesion within the spleen as described. This is larger than seen on prior examination and may simply represent an enlarged hemangioma. The evaluation is somewhat limited due to lack of IV contrast. More aggressive process could not be totally excluded. Left renal calculi with evidence of left-sided hydronephrosis. This is new from the prior exam but similar to that seen on recent ultrasound. No obstructing lesion or stone is noted. Mild ascites. Electronically Signed   By: Inez Catalina M.D.   On: 12/11/2018 19:52   Dg Ribs Unilateral W/chest Right  Result Date: 12/10/2018 CLINICAL DATA:  Chest pain. EXAM: RIGHT RIBS AND CHEST - 3+ VIEW COMPARISON:  Radiographs of December 08, 2018. FINDINGS: No fracture or other bone lesions are seen involving the ribs. There is no evidence of pneumothorax or pleural effusion. As noted on recent x-ray, nodular density is seen in right upper lobe. Heart size and mediastinal contours are within normal limits. IMPRESSION: Normal right ribs. No pneumothorax or pleural effusion is noted. As noted on recent x-ray, nodular density is noted in right upper lobe, and CT scan of the chest is recommended to rule out pulmonary nodule or mass. Electronically Signed   By: Marijo Conception M.D.   On: 12/10/2018 14:54   Ct Chest Wo Contrast  Result Date: 12/11/2018 CLINICAL DATA:  Known left-sided hydronephrosis and chronic renal disease EXAM: CT  CHEST, ABDOMEN AND PELVIS WITHOUT CONTRAST TECHNIQUE: Multidetector CT imaging of the chest, abdomen and pelvis was performed following the standard protocol without IV contrast. COMPARISON:  04/10/2017 FINDINGS: CT CHEST FINDINGS Cardiovascular: Thoracic aorta demonstrates atherosclerotic calcifications without aneurysmal dilatation. Coronary calcifications are noted. No cardiac enlargement is seen. Mediastinum/Nodes: Thoracic inlet is within normal limits. No hilar or mediastinal adenopathy is noted. The esophagus as visualized is within normal limits. Lungs/Pleura: Lungs are well aerated bilaterally with mild emphysematous changes. Small bilateral pleural effusions are seen left greater than right. Mild left lower lobe atelectasis is noted. Scarring in the right middle lobe and lingula on the left are again seen and stable. Multiple nodules are noted throughout both lungs increased in appearance when compared with the prior exam. This is most notable in the right upper lobe where a 10 mm nodule is noted best seen on image number 57 of series 4. Scattered smaller nodules are seen. Previously noted ground-glass attenuation area in the posterior right upper lobe has improved but demonstrates some residual nodular changes. The remainder of the nodular densities measure less than 5 mm. Musculoskeletal: Degenerative changes of the thoracic spine are noted. No lytic or sclerotic lesions are seen. Extrapleural soft tissue densities are noted along the left lateral aspect of the sternum stable in appearance from the prior exam from 2019 again likely benign in etiology. CT ABDOMEN PELVIS FINDINGS Hepatobiliary: Somewhat limited due to lack of IV contrast. Stable hepatic cyst is noted. The gallbladder is within normal limits. Pancreas: Atrophic changes of the pancreas are seen. Spleen: Spleen is enlarged in size. Evaluation is somewhat limited due to lack of IV contrast. A mixed attenuation lesion is identified which  measures at least 10 cm in greatest dimension. This would correspond with that seen on prior ultrasound examination. In retrospect, this was likely present on the prior exam of 2019 but again incompletely evaluated because of lack of IV contrast. Previously it measured just over 5 cm. Adrenals/Urinary Tract: Adrenal glands are  within normal limits. The right kidney demonstrates no focal abnormality. The left kidney demonstrates some renal calculi. Left-sided hydronephrosis is seen although the ureter appears to be within normal limits. No ureteral calculi are noted. The bladder is well distended. Stomach/Bowel: The appendix is within normal limits. Colon is filled with fecal material although no obstructive changes are seen. Small bowel and stomach appear within normal limits. Vascular/Lymphatic: Aortic atherosclerosis. No enlarged abdominal or pelvic lymph nodes. Reproductive: Prostate is unremarkable. Other: Free fluid is noted within the pelvis. Free fluid also surrounds the liver. Musculoskeletal: Degenerative changes of the lumbar spine are seen. No lytic or sclerotic lesions are noted. IMPRESSION: CT of the chest: Scattered nodular densities bilaterally which have increased in the interval from the prior exam particularly in the right upper lobe where a 10 mm mildly spiculated nodule is seen as described. Small pleural effusions are noted as well. Outpatient PET-CT may be helpful for further evaluation. Soft tissue densities are noted along the left lateral aspect of the sternum stable in appearance from the prior exam and likely benign in etiology. CT of the abdomen and pelvis: Mixed attenuation mass lesion within the spleen as described. This is larger than seen on prior examination and may simply represent an enlarged hemangioma. The evaluation is somewhat limited due to lack of IV contrast. More aggressive process could not be totally excluded. Left renal calculi with evidence of left-sided hydronephrosis.  This is new from the prior exam but similar to that seen on recent ultrasound. No obstructing lesion or stone is noted. Mild ascites. Electronically Signed   By: Inez Catalina M.D.   On: 12/11/2018 19:52   Nm Bone Scan Whole Body  Result Date: 12/12/2018 CLINICAL DATA:  Abdominal neoplasm. Concern for MM. Current pain right clavicle. EXAM: NUCLEAR MEDICINE WHOLE BODY BONE SCAN TECHNIQUE: Whole body anterior and posterior images were obtained approximately 3 hours after intravenous injection of radiopharmaceutical. RADIOPHARMACEUTICALS:  21.2 mCi Technetium-53mMDP IV COMPARISON:  CT 12/11/2018 FINDINGS: Exam demonstrates 2 adjacent foci of increased radiotracer activity over the right anterior third and fourth rib ends likely posttraumatic. Small focus of increased tracer activity over the soft tissues just above the left hip lateral to the iliac bone seen only on the anterior images without corresponding abnormality on CT as this may be artifactual. No focal abnormality over the right clavicle to explain patient's right clavicular pain. Minimal symmetric uptake over the shoulders, elbows, knees and ankles compatible with degenerative changes. Increased tracer activity over dilated left intrarenal collecting system/renal pelvis as seen on CT. IMPRESSION: 1. No focal abnormality over the right clavicle to account for patient's pain. 2. Two adjacent foci of increased tracer activity over the right third and fourth anterior rib ends likely posttraumatic as metastatic disease is much less likely. 3.  Degenerative changes of the appendicular skeleton. 4.  Left hydronephrosis. Electronically Signed   By: DMarin OlpM.D.   On: 12/12/2018 16:21   [place]al  Result Date: 12/25/2018 CLINICAL DATA:  Followup hydronephrosis EXAM: RENAL / URINARY TRACT ULTRASOUND COMPLETE COMPARISON:  CT 12/23/2018, ultrasound 12/10/2018 FINDINGS: Right Kidney: Renal measurements: 10.3 x 4.5 x 5.2 cm = volume: 127 mL . Echogenicity  within normal limits. No mass or hydronephrosis visualized. Left Kidney: Renal measurements: 9.7 x 5.5 x 4.9 cm = volume: 136 mL. Echogenicity within normal limits. Moderate hydronephrosis. Scattered echogenic foci within the left kidney compatible with known renal calculi. Bladder: Bladder is mildly distended and appears within normal limits. Right  ureteral jet was seen. No left ureteral jet was visualized during the course of the examination. Other: Small volume ascites. Irregular splenic mass, better characterized on recent CT. IMPRESSION: 1. Left-sided nephrolithiasis with moderate left hydronephrosis. No left-sided ureteral jet was seen. 2. Small volume ascites. 3. Partially visualized splenic mass. Electronically Signed   By: Davina Poke M.D.   On: 12/25/2018 11:34   US Renal  Result Date: 12/10/2018 CLINICAL DATA:  Acute renal injury with left flank pain EXAM: RENAL / URINARY TRACT ULTRASOUND COMPLETE COMPARISON:  None. FINDINGS: Right Kidney: Renal measurements: 11.9 x 4.3 x 4.7 cm. = volume: 125 mL. Diffuse increased echogenicity is noted. No mass lesion or hydronephrosis is noted. Left Kidney: Renal measurements: 13.6 x 6.2 x 7.6 cm. = volume: 429 mL. Moderate hydronephrosis is seen. Mild increased echogenicity is noted. Bladder: Bladder is well distended. Bilateral pleural effusions and mild ascites is seen. Spleen is mildly prominent with some diffuse heterogeneity within the central portion suggestive of underlying mass. Contrast enhanced CT may be helpful. Alternatively MRI on a nonemergent basis could be performed. IMPRESSION: Left-sided hydronephrosis. Bilateral effusions and mild ascites. Increased echogenicity in the right kidney is noted consistent with medical renal disease. Mild increased echogenicity is noted on the left. Findings suspicious for a mass within the spleen. This may simply represent a large hemangioma. Contrast enhanced CT or MRI may be helpful for further evaluation.  Electronically Signed   By: Inez Catalina M.D.   On: 12/10/2018 20:08   Nm Pet Image Initial (pi) Skull Base To Thigh  Result Date: 01/05/2019 CLINICAL DATA:  Initial treatment strategy for lymphoma. EXAM: NUCLEAR MEDICINE PET SKULL BASE TO THIGH TECHNIQUE: 8.0 mCi F-18 FDG was injected intravenously. Full-ring PET imaging was performed from the skull base to thigh after the radiotracer. CT data was obtained and used for attenuation correction and anatomic localization. Fasting blood glucose: 108 mg/dl COMPARISON:  Multiple exams, including CT abdomen from 12/23/2018 and CT chest from 12/11/2018 FINDINGS: Mediastinal blood pool activity: SUV max 1.8 Liver activity: SUV max 2.4 NECK: No significant abnormal hypermetabolic activity in this region. Incidental CT findings: Chronic left maxillary sinusitis. CHEST: Left supraclavicular lymph node 0.7 cm in short axis on image 47/4, maximum SUV 3.1, [place] 4. Small AP window lymph node measuring 1.0 cm short axis on image 66/4, maximum SUV 5.5, Deauville 4. Left internal mammary lymph node, 0.9 cm in short axis on image 66/4, maximum SUV 3.0, [place] 4. A 0.7 cm right upper lobe nodule on image 23/8 has a maximum SUV of 1.8, [place] 2. Other smaller nodules are not definitely hypermetabolic but are below sensitive PET-CT size thresholds. Mostly photopenic large left and moderate right pleural effusions, increased from prior. Incidental CT findings: Scattered mild atelectasis in addition to the passive atelectasis. Coronary, aortic arch, and branch vessel atherosclerotic vascular disease. ABDOMEN/PELVIS: Marked hypermetabolic activity throughout the spleen, with photopenia corresponding to the hypodensity centrally in the spleen, maximum SUV 22.0 (Deauville 5). Abnormal masslike appearance along the splenic hilum and pancreatic tail likewise highly hypermetabolic conglomerate with adenopathy anterior to the abdominal aorta. Left periaortic lymph nodes measure up  to 1.8 cm in short axis individually, maximum SUV 19.6 (Deauville 5). There is abnormal prominence of stool in the proximal colon which expense into the region of the left upper quadrant mass. The colon is obscured in this vicinity but the descending colon is of normal caliber, accordingly the left upper quadrant mass is causing some degree of relative partial  obstruction. Invasion into the splenic flexure is not excluded. Mass origination from the splenic flexure is not excluded. Moderate ascites with low-grade metabolic activity, maximum SUV about 2.5. 3.3 cm complex lesion of the left kidney upper pole has low-grade metabolic activity, maximum SUV 3.9, suspicious for a mass. Nodularity along the omentum, but without hypermetabolic activity. This may be vascular but merit surveillance. Incidental CT findings: Hepatic cyst. Aortoiliac atherosclerotic vascular disease. Left hydronephrosis probably related to left UPJ obstruction possibly by a band of tumor. Multiple left renal calculi measuring up to 1.2 cm in diameter. SKELETON: There are few scattered foci of hypermetabolic skeletal activity. Right anterior third and fourth rib fractures are identified, both with faintly accentuated metabolic activity. Inferiorly in the right scapular there is focally accentuated metabolic activity, maximum SUV 5.2 (Deauville 4). There is a soft tissue nodule along the inferior margin of the left twelfth rib measuring 2.0 by 1.4 cm on image 128/4 with maximum SUV 11.9, [place] 5. A small lesion in the right side of the T7 vertebra has maximum SUV 6.8, [place] 4. Incidental CT findings: none IMPRESSION: 1. 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 Most of this is Deauville 5 activity in this region. 2. Deauville 4  lymph nodes in the chest. Deauville 2 right upper lobe pulmonary nodule. 3. Abnormal foci of skeletal activity favoring malignancy, including the inferior right scapula (Deauville 4), right T7 vertebral body (Deauville 4), and a soft tissue nodule along the margin of the left twelfth rib (Deauville 5). There also likely benign anterior rib fractures of the right third and fourth rib with low-grade activity. 4. Likely partial obstruction of the colon at the splenic flexure where it becomes involved in the left upper quadrant mass. Invasion of the colon is not excluded, and strictly speaking, a primary malignancy of the sigmoid flexure is not excluded. 5. 3.3 cm complex lesion of the left kidney upper pole has low-grade metabolic activity, suspicious for a mass. 6. Moderate ascites with low-grade metabolic activity, malignant ascites is not excluded. There is some nodularity along the omentum but without significant hypermetabolic activity at this time. 7. Other imaging findings of potential clinical significance: Chronic left maxillary sinusitis. Large left and moderate right pleural effusions, increased from prior CT. Aortic Atherosclerosis (ICD10-I70.0). Coronary atherosclerosis. 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 Multiple left-sided renal calculi. Electronically Signed   By: Van Clines M.D.   On: 01/05/2019 16:13   Dg Abd 2 Views  Result Date: 01/01/2019 CLINICAL DATA:  Constipation. EXAM: ABDOMEN - 2 VIEW COMPARISON:  Radiographs 12/24/2018 FINDINGS: The lung bases are grossly clear.  Minimal left basilar atelectasis. Some scattered stool in the colon but no significant stool burden. Few scattered small bowel loops with air but no distension. The soft tissue shadows of the abdomen are maintained. No worrisome calcifications. Stable scoliosis and degenerative changes in the spine. IMPRESSION: 1. No plain film findings for an acute abdominal process. 2. No  significant stool burden. Electronically Signed   By: Marijo Sanes M.D.   On: 01/01/2019 15:26   Dg Abd Portable 1v  Result Date: 12/24/2018 CLINICAL DATA:  Constipation. EXAM: PORTABLE ABDOMEN - 1 VIEW COMPARISON:  CT yesterday, CT 12/11/2018 also reviewed FINDINGS: Gaseous distension of ascending and transverse colon, similar to CT yesterday. Small  volume of formed stool. More distal colon is collapsed and not well evaluated on radiograph. No evidence of free air on single supine view. No small bowel dilatation. Left renal calculi on prior CT are faintly visualized. IMPRESSION: Gaseous distension of the ascending and transverse colon, similar to CT yesterday, question external compression from splenic lesion. Small volume of formed stool within the distended colon. No small bowel dilatation or small bowel obstruction. Electronically Signed   By: Keith Rake M.D.   On: 12/24/2018 19:35    Labs:  CBC: Recent Labs    12/23/18 1516 12/24/18 0658 12/25/18 0535 12/26/18 0805  WBC 13.8* 24.2* 15.5* 10.5  HGB 12.0* 11.0* 9.5* 10.2*  HCT 36.9* 33.1* 28.8* 31.4*  PLT 369 369 282 315    COAGS: Recent Labs    01/08/19 0930  INR 1.1    BMP: Recent Labs    12/23/18 1516 12/24/18 0658 12/25/18 0535 12/26/18 0805  NA 136 137 135 139  K 3.7 3.6 3.2* 3.5  CL 100 104 104 108  CO2 '23 22 23 '$ 20*  GLUCOSE 125* 132* 108* 103*  BUN 29* 36* 45* 34*  CALCIUM 9.2 8.0* 7.3* 7.9*  CREATININE 2.19* 1.93* 1.88* 1.75*  GFRNONAA 26* 31* 32* 35*  GFRAA 31* 36* 37* 40*    LIVER FUNCTION TESTS: Recent Labs    12/08/18 1743 12/23/18 1516 12/24/18 0658 12/25/18 0535  BILITOT 0.8 1.1 1.1 1.0  AST 32 '25 19 18  '$ ALT 32 '19 15 14  '$ ALKPHOS 79 93 76 65  PROT 6.8 6.6 5.4* 4.8*  ALBUMIN 3.9 3.6 2.9* 2.6*     Assessment and Plan:  Anemia, retroperitoneal lymphadenopathy, lung nodules, necrotic splenic mass. Plan for image-guided bone marrow biopsy/aspiration today in IR to R/O  lymphoma. Patient is NPO. Afebrile. He does not take blood thinners. INR 1.1 today.  Risks and benefits discussed with the patient including, but not limited to bleeding, infection, damage to adjacent structures or low yield requiring additional tests. All of the patient's questions were answered, patient is agreeable to proceed. Consent signed and in chart.   Thank you for this interesting consult.  I greatly enjoyed meeting Ricardo Sloan and look forward to participating in their care.  A copy of this report was sent to the requesting provider on this date.  Electronically Signed: Earley Abide, PA-C 01/08/2019, 9:57 AM   I spent a total of 40 Minutes in face to face in clinical consultation, greater than 50% of which was counseling/coordinating care for anemia/bone marrow biopsy and aspiration.

## 2019-01-08 NOTE — Discharge Instructions (Signed)
Please call IR clinic [phone] or after hours [phone] and ask for IR doctor on call with any questions concerning your biopsy.  Moderate Conscious Sedation, Adult, Care After These instructions provide you with information about caring for yourself after your procedure. Your health care provider may also give you more specific instructions. Your treatment has been planned according to current medical practices, but problems sometimes occur. Call your health care provider if you have any problems or questions after your procedure. What can I expect after the procedure? After your procedure, it is common:  To feel sleepy for several hours.  To feel clumsy and have poor balance for several hours.  To have poor judgment for several hours.  To vomit if you eat too soon. Follow these instructions at home: For at least 24 hours after the procedure:   Do not: ? Participate in activities where you could fall or become injured. ? Drive. ? Use heavy machinery. ? Drink alcohol. ? Take sleeping pills or medicines that cause drowsiness. ? Make important decisions or sign legal documents. ? Take care of children on your own.  Rest. Eating and drinking  Follow the diet recommended by your health care provider.  If you vomit: ? Drink water, juice, or soup when you can drink without vomiting. ? Make sure you have little or no nausea before eating solid foods. General instructions  Have a responsible adult stay with you until you are awake and alert.  Take over-the-counter and prescription medicines only as told by your health care provider.  If you smoke, do not smoke without supervision.  Keep all follow-up visits as told by your health care provider. This is important. Contact a health care provider if:  You keep feeling nauseous or you keep vomiting.  You feel light-headed.  You develop a rash.  You have a fever. Get help right away if:  You have trouble  breathing. This information is not intended to replace advice given to you by your health care provider. Make sure you discuss any questions you have with your health care provider. Document Released: 12/31/2012 Document Revised: Dec 21, 202018 Document Reviewed: 07/02/2015 Elsevier Patient Education  2020 [employer].   Bone Marrow Aspiration and Bone Marrow Biopsy, Adult, Care After This sheet gives you information about how to care for yourself after your procedure. Your health care provider may also give you more specific instructions. If you have problems or questions, contact your health care provider. What can I expect after the procedure? After the procedure, it is common to have:  Mild pain and tenderness.  Swelling.  Bruising. Follow these instructions at home: Puncture site care      Follow instructions from your health care provider about how to take care of the puncture site. Make sure you: ? Wash your hands with soap and water before you change your bandage (dressing). If soap and water are not available, use hand sanitizer. ? Change your dressing as told by your health care provider.  You may shower and remove your dressing tomorrow.  Check your puncture siteevery day for signs of infection. Check for: ? More redness, swelling, or pain. ? More fluid or blood. ? Warmth. ? Pus or a bad smell. General instructions  Take over-the-counter and prescription medicines only as told by your health care provider.  Do not take baths, swim, or use a hot tub until your health care provider approves. Ask if you can take a shower or have a sponge bath.  Return  to your normal activities as told by your health care provider. Ask your health care provider what activities are safe for you.  Do not drive for 24 hours if you were given a medicine to help you relax (sedative) during your procedure.  Keep all follow-up visits as told by your health care provider. This is  important. Contact a health care provider if:  Your pain is not controlled with medicine. Get help right away if:  You have a fever.  You have more redness, swelling, or pain around the puncture site.  You have more fluid or blood coming from the puncture site.  Your puncture site feels warm to the touch.  You have pus or a bad smell coming from the puncture site. These symptoms may represent a serious problem that is an emergency. Do not wait to see if the symptoms will go away. Get medical help right away. Call your local emergency services (911 in the [place] Do not drive yourself to the hospital. Summary  After the procedure, it is common to have mild pain, tenderness, swelling, and bruising.  Follow instructions from your health care provider about how to take care of the puncture site.  Get help right away if you have any symptoms of infection or if you have more blood or fluid coming from the puncture site. This information is not intended to replace advice given to you by your health care provider. Make sure you discuss any questions you have with your health care provider. Document Released: 09/29/2004 Document Revised: 06/25/2017 Document Reviewed: 08/24/2015 Elsevier Patient Education  2020 [employer].

## 2019-01-08 NOTE — Procedures (Signed)
Interventional Radiology Procedure Note  Procedure: CT guided bone marrow aspiration and biopsy  Complications: None  EBL: < 10 mL  Findings: Aspirate and core biopsy performed of bone marrow in right iliac bone.  Plan: Bedrest supine x 1 hrs  Lulie Hurd T. Mikeila Burgen, M.D Pager:  [phone]   

## 2019-01-08 NOTE — Telephone Encounter (Signed)
Medication Refill - Medication: polyethylene glycol powder (GLYCOLAX/MIRALAX) 17 GM/SCOOP powder [id number]  He is also checking on a mild laxative that son stated that Dr burns was going to call in yesterday  Has the patient contacted their pharmacy? No. (Agent: If no, request that the patient contact the pharmacy for the refill.) (Agent: If yes, when and what did the pharmacy advise?)  Preferred Pharmacy (with phone number or street name):  [facility] [place], [place] - [place] [phone] (Phone)     Agent: Please be advised that RX refills may take up to 3 business days. We ask that you follow-up with your pharmacy.

## 2019-01-09 MED ORDER — BISACODYL EC 5 MG PO TBEC: 5.0000 mg | DELAYED_RELEASE_TABLET | Freq: Every day | ORAL | 0 refills | Status: DC | PRN

## 2019-01-09 MED ORDER — POLYETHYLENE GLYCOL 3350 17 GM/SCOOP PO POWD: ORAL | 5 refills | Status: DC

## 2019-01-09 NOTE — Telephone Encounter (Signed)
Were you going to send in another med to help pt with constipation or was that what you sent in on 10/13? Ok to fill miralax?

## 2019-01-09 NOTE — Telephone Encounter (Signed)
miralax sent in.  He can increase the bisacodyl to 10 mg daily as needed.

## 2019-01-09 NOTE — Telephone Encounter (Signed)
Pt never picked up the bisacodyl. He will try the 5 mg first and then increase it if needed.

## 2019-01-10 LAB — SURGICAL PATHOLOGY

## 2019-01-12 ENCOUNTER — Ambulatory Visit (INDEPENDENT_AMBULATORY_CARE_PROVIDER_SITE_OTHER): Payer: Medicare HMO | Admitting: Internal Medicine

## 2019-01-12 ENCOUNTER — Telehealth: Payer: Self-pay | Admitting: Internal Medicine

## 2019-01-12 ENCOUNTER — Encounter: Payer: Self-pay | Admitting: Internal Medicine

## 2019-01-12 ENCOUNTER — Other Ambulatory Visit: Payer: Self-pay

## 2019-01-12 DIAGNOSIS — K5900 Constipation, unspecified: Secondary | ICD-10-CM

## 2019-01-12 DIAGNOSIS — K56699 Other intestinal obstruction unspecified as to partial versus complete obstruction: Secondary | ICD-10-CM

## 2019-01-12 DIAGNOSIS — R0602 Shortness of breath: Secondary | ICD-10-CM | POA: Diagnosis not present

## 2019-01-12 MED ORDER — POLYETHYLENE GLYCOL 3350 17 GM/SCOOP PO POWD: ORAL | 5 refills | Status: AC

## 2019-01-12 MED ORDER — BISACODYL EC 5 MG PO TBEC: 10.0000 mg | DELAYED_RELEASE_TABLET | Freq: Every day | ORAL | 0 refills | Status: AC

## 2019-01-12 NOTE — Assessment & Plan Note (Signed)
He is having some SOB on exertion  - he has 2 + b/l LE edema and his PET CT shows pulm effusions Fluid overloaded - has only been taking lasix prn -- advised to take one today - may need this more regularly

## 2019-01-12 NOTE — Progress Notes (Signed)
Subjective:    Patient ID: Ricardo Sloan, male    DOB: Feb 04, 1932, 85 y.o.   MRN: [id number]  HPI The patient is here for an acute visit.  His son is with him.     Constipation:  He is having minimal stool and it is formed.  He feels very bloated.  It is very uncomfortable.  He has pain occasional across his lower abdomen.  He is eating very little. He eats mostly soup. He is drinking some fluids throughout hte day.  He has little appetite.     He is taking miralax daily, senna 2 at bedtime, colace three daily and bisacodyl daily.  He has a known splenic mass that is causing partial obstruction that is contributing to the constipation.  He has an appointment tomorrow with Dr Irene Limbo to review his PET, BM bx.    He is having some SOB with exertion.   Medications and allergies reviewed with patient and updated if appropriate.  Patient Active Problem List   Diagnosis Date Noted  . Lymphadenopathy   . Bowel obstruction ([place]) 12/23/2018  . Abdominal pain 12/23/2018  . Splenic mass 12/23/2018  . Edema 12/18/2018  . Hypercalcemia 12/05/2018  . Constipation 12/04/2018  . AKI (acute kidney injury) ([place]) 12/04/2018  . Decreased GFR 11/27/2018  . Fatigue 11/27/2018  . Pulmonary nodules/lesions, multiple 10/24/2015  . Prediabetes 10/21/2014  . Anemia 10/21/2014  . Heart palpitations 08/26/2014  . Abnormal chest x-ray 10/15/2012  . Bronchiectasis without complication ([place]) 0000000  . Mass of right lung 08/09/2009  . GERD 10/15/2008  . ERECTILE DYSFUNCTION 07/09/2007  . Mixed hyperlipidemia 07/15/2006  . Essential hypertension 07/15/2006    Current Outpatient Medications on File Prior to Visit  Medication Sig Dispense Refill  . Aspirin (ADULT ASPIRIN LOW STRENGTH) 81 MG EC tablet Take 81 mg by mouth daily.      . bisacodyl (BISACODYL) 5 MG EC tablet Take 1 tablet (5 mg total) by mouth daily as needed for moderate constipation. 30 tablet 0  . docusate sodium (COLACE) 100 MG  capsule Take 1 capsule (100 mg total) by mouth 2 (two) times daily. 10 capsule 0  . furosemide (LASIX) 20 MG tablet Take 1 tablet (20 mg total) by mouth daily as needed for fluid or edema. TAKE 1 TABLET(20 MG) BY MOUTH DAILY 90 tablet 0  . loratadine (CLARITIN) 10 MG tablet Take 10 mg by mouth daily.      . metoprolol succinate (TOPROL-XL) 25 MG 24 hr tablet Take 1 tablet (25 mg total) by mouth daily. 90 tablet 3  . Multiple Vitamin (MULTIVITAMIN) tablet Take 1 tablet by mouth daily.      Marland Kitchen omeprazole (PRILOSEC) 40 MG capsule Take 1 capsule (40 mg total) by mouth daily. 90 capsule 6  . ondansetron (ZOFRAN) 4 MG tablet Take 1 tablet (4 mg total) by mouth every 6 (six) hours as needed for nausea. 20 tablet 0  . polyethylene glycol powder (GLYCOLAX/MIRALAX) 17 GM/SCOOP powder Mix 1 scoop daily with water until constipation is relieved. 255 g 5  . pravastatin (PRAVACHOL) 40 MG tablet TAKE ONE-HALF TABLET BY MOUTH EVERY NIGHT AT BEDTIME (Patient taking differently: Take 20 mg by mouth at bedtime. ) 45 tablet 3  . senna (SENOKOT) 8.6 MG TABS tablet Take 2 tablets (17.2 mg total) by mouth at bedtime. 180 tablet 1  . Simethicone 80 MG TABS Take 1 tablet (80 mg total) by mouth 4 (four) times daily. 20 tablet 0  .  vitamin B-12 (CYANOCOBALAMIN) 1000 MCG tablet Take 1,000 mcg by mouth daily.     No current facility-administered medications on file prior to visit.     Past Medical History:  Diagnosis Date  . BPH (benign prostatic hypertrophy)   . Colon polyps   . GERD (gastroesophageal reflux disease)    Upper endoscopy 2003, Dr. Velora Heckler  . Hemorrhoids   . Hiatal hernia 2010  . Hyperlipidemia   . Hypertension   . Internal hemorrhoids   . Microscopic hematuria 2001   Dr.  Luanne Bras  . Pneumonia     X 2 in high school    Past Surgical History:  Procedure Laterality Date  . COLONOSCOPY  [id number]   Internal hemorrhoids, Dr. Velora Heckler  . NOSE SURGERY     Submucosal resection in the 1960s   . TONSILLECTOMY        . UPPER GASTROINTESTINAL ENDOSCOPY  2003   GERD  . WISDOM TOOTH EXTRACTION      Social History   Socioeconomic History  . Marital status: Married    Spouse name: Not on file  . Number of children: Not on file  . Years of education: Not on file  . Highest education level: Not on file  Occupational History  . Not on file  Social Needs  . Financial resource strain: Not on file  . Food insecurity    Worry: Not on file    Inability: Not on file  . Transportation needs    Medical: Not on file    Non-medical: Not on file  Tobacco Use  . Smoking status: Former Smoker    Packs/day: 0.75    Years: 13.00    Pack years: 9.75    Types: Cigarettes, Pipe    Quit date: 03/26/1965    Years since quitting: 53.8  . Smokeless tobacco: Never Used  . Tobacco comment: smoked 1953-1968, up to 1 ppd  Substance and Sexual Activity  . Alcohol use: Yes    Comment:  rarely socially; 1-2 / month  . Drug use: No  . Sexual activity: Not on file  Lifestyle  . Physical activity    Days per week: Not on file    Minutes per session: Not on file  . Stress: Not on file  Relationships  . Social Herbalist on phone: Not on file    Gets together: Not on file    Attends religious service: Not on file    Active member of club or organization: Not on file    Attends meetings of clubs or organizations: Not on file    Relationship status: Not on file  Other Topics Concern  . Not on file  Social History Narrative   Daily caffeine    Daily care of wife; becoming more disabled; but still cooks;    Stress 1-10; 2      epworth sleepiness scale = 10 (11/02/2015)   Fun/[place]: Garden   Denies abuse and feels safe at home.     Family History  Problem Relation Age of Onset  . Diabetes Mother   . Subarachnoid hemorrhage Daughter   . Lung cancer Brother        smoker  . Other Paternal Grandfather        typhoid  . Heart disease Neg Hx   . Stroke Neg Hx   . Hypertension  Neg Hx   . Colon cancer Neg Hx     Review of Systems  Constitutional: Positive for  appetite change and fatigue. Negative for fever.       Generalized weakness  Respiratory: Positive for shortness of breath.   Cardiovascular: Positive for leg swelling. Negative for chest pain and palpitations.  Gastrointestinal: Positive for abdominal distention, abdominal pain ([place], occ pain) and constipation. Negative for blood in stool and diarrhea.       Occ gerd       Objective:   Vitals:   01/12/19 1409  BP: 126/78  Pulse: (!) 109  Resp: 16  Temp: 97.7 F (36.5 C)  SpO2: 95%   BP Readings from Last 3 Encounters:  01/12/19 126/78  01/08/19 130/88  01/01/19 120/80   Wt Readings from Last 3 Encounters:  01/01/19 156 lb (70.8 kg)  12/26/18 150 lb 12.7 oz (68.4 kg)  12/23/18 157 lb (71.2 kg)   Body mass index is 23.04 kg/m.   Physical Exam Constitutional:      General: He is not in acute distress.    Appearance: He is ill-appearing (chronic). He is not toxic-appearing or diaphoretic.  HENT:     Head: Normocephalic and atraumatic.  Cardiovascular:     Rate and Rhythm: Normal rate and regular rhythm.  Pulmonary:     Effort: Pulmonary effort is normal. No respiratory distress.     Breath sounds: No wheezing.     Comments: Decreased BS left base > right base Abdominal:     General: There is distension.     Palpations: Abdomen is soft.     Tenderness: There is abdominal tenderness (LUQ and to a lesser degree diffusely). There is no guarding or rebound.  Musculoskeletal:     Right lower leg: Edema (2+) present.     Left lower leg: Edema (2 +) present.  Skin:    General: Skin is warm and dry.  Neurological:     Mental Status: He is alert.            Assessment & Plan:    See Problem List for Assessment and Plan of chronic medical problems.

## 2019-01-12 NOTE — Assessment & Plan Note (Signed)
Related to partial obstruction from splenic mass, decreased po intake and probable ileus Continue senna 2 tabs at bedtime Continue colace 3/ day Increase miralax to BID Increase bisacodyl to 10 mg daily prn Increase fluids Po intake as tolerated - can try dry fruit, prunes

## 2019-01-12 NOTE — Assessment & Plan Note (Signed)
Related to splenic mass, which is likely cancer - sees oncology tomorrow Contributing to constipation Will try to get stool softer so it has a better chance of passing the splenic mass

## 2019-01-12 NOTE — Progress Notes (Signed)
HEMATOLOGY/ONCOLOGY CLINIC NOTE  Date of Service: 01/13/2019  Patient Care Team: Binnie Rail, MD as PCP - General (Internal Medicine) Irene Shipper, MD as Consulting Physician (Gastroenterology)  CHIEF COMPLAINTS/PURPOSE OF CONSULTATION:  Concern for occult cancer  HISTORY OF PRESENTING ILLNESS:   Ricardo Sloan is a wonderful 85 y.o. male who is here for evaluation and management of concern for occult cancer. Pt is accompanied today by his son Ricardo Sloan, via phone. The pt reports that he is doing well overall.   The pt reports that he began to feel differently about a month ago. He was experiencing fatigue and weakness in his legs. He went to see his PCP who did some blood work. After which he was admitted to the hospital on 09/17 and diagnosed with Hypercalcemia. Since leaving the hospital his abdomen has been sore and distended. Pt states that every few minutes he has a sharp pain in his lower left abdomen that has gotten significantly worse since yesterday. Pt denies any back pain or radiating pains. He has noticed some "stringy" bowel movements lately but his last bowel movement was normal. His PCP placed him on Metamucil but he does not feel that it helps him. He does note some bloating and trapped gas in his stomach. These symptoms are new since his hospital discharge.    He denies any changes in breathing, SOB or chest pains. He has not received any information on the possible causes for his lung nodules. Ricardo Sloan states that pt's PCP was concerned about the fluid in pt's lungs and they did some testing that was not conclusive. Pt has been on a diuretic since he saw his PCP last week, which has helped alleviate his leg swelling. Pt denies any kidney problems before his hospitalization or issues with urination. He has had no new bone pain. Pt has been taking multivitamin daily and has not been on any antibiotics lately. He was not given any pain medication upon discharge only tablets for  nausea. Pt reports decreased appetite but has not lost any weight in recent months. He has not travelled outside of [place], has not been exposed to anyone with a pulmonary infection or anyone who is from a place with a high rate of fungal infections.   Pt has an upcoming appointment with his Nephrologist Dr. [name] at [facility] in about 2 weeks. He does not have an appointement with a Urologist.    Of note prior to the patient's visit today, had a whole Body bone scan ([id number]) completed on 12/12/2018 with results revealing "1. No focal abnormality over the right clavicle to account for patient's pain. 2. Two adjacent foci of increased tracer activity over the right third and fourth anterior rib ends likely posttraumatic as metastatic disease is much less likely. 3. Degenerative changes of the appendicular skeleton. 4. Left hydronephrosis."   Pt has had CT C/A/P ([id number]) ([id number]) completed on 09/17/020 with results revealing "CT of the chest: Scattered nodular densities bilaterally which have increased in the interval from the prior exam particularly in the right upper lobe where a 10 mm mildly spiculated nodule is seen as described. Small pleural effusions are noted as well. Outpatient PET-CT may be helpful for further evaluation. Soft tissue densities are noted along the left lateral aspect of the sternum stable in appearance from the prior exam and likely benign in etiology. CT of the abdomen and pelvis: Mixed attenuation mass lesion within the spleen as described. This is larger than  seen on prior examination and may simply represent an enlarged hemangioma. The evaluation is somewhat limited due to lack of IV contrast. More aggressive process could not be totally excluded. Left renal calculi with evidence of left-sided hydronephrosis. This is new from the prior exam but similar to that seen on recent ultrasound. No obstructing lesion or stone is noted. Mild ascites."  Pt has had  Renal US ([id number]) completed on 12/10/2018 with results revealing "Left-sided hydronephrosis. Bilateral effusions and mild ascites. Increased echogenicity in the right kidney is noted consistent with medical renal disease. Mild increased echogenicity is noted on the left. Findings suspicious for a mass within the spleen. This may simply represent a large hemangioma. Contrast enhanced CT or MRI may be helpful for further evaluation."  Most recent lab results (12/18/2018) of CBC & BMP is as follows: WBC at 6.5K, RBC at 3.61, PLTs at 301.0K, Hgb at 11.0, HCT at 32.7, MCV at 90.4, MCHC at 33.7, RDW at 14.5, Sodium at 140, Potassium at 3.6, Chloride at 106, CO2 at 25, Glucose at 147, BUN at 27, Creatinine at 1.85, Calcium at 10.4, GFR at 34.88.   On review of systems, pt reports nausea, lower left abdominal pain, bowel movement changes, improved leg swelling and denies back pain, new bone pain, breathing changes, SOB, chest pains, unexpected weight loss and any other symptoms.   On PMHx the pt reports HTN, HLD.  INTERVAL HISTORY:   Ricardo Sloan is a wonderful 85 y.o. male who is here for evaluation and management of concern for occult cancer. We are joined today by his [name], via phone. The patient's last visit with [place] was on 12/23/2018. The pt reports that he is doing well overall.  The pt reports that his BM Bx and PET/CT went okay and he didn't have too many issues. Pt has been working very closely with his PCP to stop his constipation. He has been taking stool softeners and laxatives prescribed by PCP. He has been going to the restroom more frequently, sometimes multiple times a day. Previously he had taken only one bowel movement in 2 weeks. Pt has a Nephrology appointment tomorrow. He reports feeling very tired, weak and is unable to do most of his common tasks. His granddaughter reports that he has been having a hard time eating and he has not been feeling well throughout this  entire process.   Of note since the patient's last visit, pt has had PET/CT Scan ([id number]) completed on 01/05/2019 with results revealing "1. 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 Most of this is Deauville 5 activity in this region. 2. Deauville 4 lymph nodes in the chest. Deauville 2 right upper lobe pulmonary nodule. 3. Abnormal foci of skeletal activity favoring malignancy, including the inferior right scapula (Deauville 4), right T7 vertebral body (Deauville 4), and a soft tissue nodule along the margin of the left twelfth rib (Deauville 5). There also likely benign anterior rib fractures of the right third and fourth rib with low-grade activity. 4. Likely partial obstruction of the colon at the splenic flexure where it becomes involved in the left upper quadrant mass. Invasion of the colon is not excluded, and strictly speaking, a primary malignancy of the sigmoid flexure is not excluded. 5. 3.3 cm complex lesion of the left kidney upper pole has  low-grade metabolic activity, suspicious for a mass. 6. Moderate ascites with low-grade metabolic activity, malignant ascites is not excluded. There is some nodularity along the omentum but without significant hypermetabolic activity at this time. 7. Other imaging findings of potential clinical significance: Chronic left maxillary sinusitis. Large left and moderate right pleural effusions, increased from prior CT. Aortic Atherosclerosis (ICD10-I70.0). Coronary atherosclerosis. 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 Multiple left-sided renal calculi."  Pt has had Bone Marrow Report ([id number]) completed on 01/08/2019 with results revealing "BONE MARROW, ASPIRATE, CLOT, CORE: -Variably cellular bone marrow  with trilineage hematopoiesis -A few small lymphoid aggregates present PERIPHERAL BLOOD: -Normocytic -normochromic anemia -Thrombocytosis"  Pt has had Flow Pathology Report ([id number]) completed on 01/08/2019 with results revealing "-No monoclonal B-cell population or abnormal T-cell phenotype identified."  Lab results today (01/13/19) of CBC w/diff and CMP is as follows: all values are WNL except for RBC at 3.76, Hgb at 11.3, HCT at 33.1, Lymphs Abs at 0.5K, Sodium at 134, CO2 at 18, Glucose at 138, BUN at 29, Creatinine at 1.56, Total Protein at 5.6, Albumin at 2.7, GFR Est Non Af Am at 40. 01/13/2019 LDH is at 210  On review of systems, pt reports fatigue, loss of appetite, weakness and denies constipation, abdominal pain, back pain and any other symptoms.   MEDICAL HISTORY:  Past Medical History:  Diagnosis Date   BPH (benign prostatic hypertrophy)    Colon polyps    GERD (gastroesophageal reflux disease)    Upper endoscopy 2003, Dr. Velora Heckler   Hemorrhoids    Hiatal hernia 2010   Hyperlipidemia    Hypertension    Internal hemorrhoids    Microscopic hematuria 2001   Dr.  Sherrye Payor Kimbrough   Pneumonia     X 2 in high school    SURGICAL HISTORY: Past Surgical History:  Procedure Laterality Date   COLONOSCOPY  2001,2006   Internal hemorrhoids, Dr. Velora Heckler   NOSE SURGERY     Submucosal resection in the [place] ENDOSCOPY  2003   GERD   WISDOM TOOTH EXTRACTION      SOCIAL HISTORY: Social History   Socioeconomic History   Marital status: Married    Spouse name: Not on file   Number of children: Not on file   Years of education: Not on file   Highest education level: Not on file  Occupational History   Not on file  Social Needs   Financial resource strain: Not on file   Food insecurity    Worry: Not on file    Inability: Not on file   Transportation needs    Medical: Not on file     Non-medical: Not on file  Tobacco Use   Smoking status: Former Smoker    Packs/day: 0.75    Years: 13.00    Pack years: 9.75    Types: Cigarettes, Pipe    Quit date: 03/26/1965    Years since quitting: 53.8   Smokeless tobacco: Never Used   Tobacco comment: smoked 1953-1968, up to 1 ppd  Substance and Sexual Activity   Alcohol use: Yes    Comment:  rarely socially; 1-2 / month   Drug use: No   Sexual activity: Not on file  Lifestyle   Physical activity    Days per week: Not on file    Minutes per session: Not on file   Stress: Not on file  Relationships  Social Herbalist on phone: Not on file    Gets together: Not on file    Attends religious service: Not on file    Active member of club or organization: Not on file    Attends meetings of clubs or organizations: Not on file    Relationship status: Not on file   Intimate partner violence    Fear of current or ex partner: Not on file    Emotionally abused: Not on file    Physically abused: Not on file    Forced sexual activity: Not on file  Other Topics Concern   Not on file  Social History Narrative   Daily caffeine    Daily care of wife; becoming more disabled; but still cooks;    Stress 1-10; 2      epworth sleepiness scale = 10 (11/02/2015)   Fun/[employer]ob[employer]: Garden   Denies abuse and feels safe at home.     FAMILY HISTORY: Family History  Problem Relation Age of Onset   Diabetes Mother    Subarachnoid hemorrhage Daughter    Lung cancer Brother        smoker   Other Paternal Grandfather        typhoid   Heart disease Neg Hx    Stroke Neg Hx    Hypertension Neg Hx    Colon cancer Neg Hx     ALLERGIES:  has No Known Allergies.  MEDICATIONS:  Current Outpatient Medications  Medication Sig Dispense Refill   Aspirin (ADULT ASPIRIN LOW STRENGTH) 81 MG EC tablet Take 81 mg by mouth daily.       bisacodyl (BISACODYL) 5 MG EC tablet Take 2 tablets (10 mg total) by mouth daily. 30  tablet 0   docusate sodium (COLACE) 100 MG capsule Take 1 capsule (100 mg total) by mouth 2 (two) times daily. 10 capsule 0   furosemide (LASIX) 20 MG tablet Take 1 tablet (20 mg total) by mouth daily as needed for fluid or edema. TAKE 1 TABLET(20 MG) BY MOUTH DAILY 90 tablet 0   loratadine (CLARITIN) 10 MG tablet Take 10 mg by mouth daily.       metoprolol succinate (TOPROL-XL) 25 MG 24 hr tablet Take 1 tablet (25 mg total) by mouth daily. 90 tablet 3   Multiple Vitamin (MULTIVITAMIN) tablet Take 1 tablet by mouth daily.       omeprazole (PRILOSEC) 40 MG capsule Take 1 capsule (40 mg total) by mouth daily. 90 capsule 6   ondansetron (ZOFRAN) 4 MG tablet Take 1 tablet (4 mg total) by mouth every 6 (six) hours as needed for nausea. 20 tablet 0   polyethylene glycol powder (GLYCOLAX/MIRALAX) 17 GM/SCOOP powder Mix 1 scoop twice daily with water until constipation is relieved. 255 g 5   pravastatin (PRAVACHOL) 40 MG tablet TAKE ONE-HALF TABLET BY MOUTH EVERY NIGHT AT BEDTIME (Patient taking differently: Take 20 mg by mouth at bedtime. ) 45 tablet 3   senna (SENOKOT) 8.6 MG TABS tablet Take 2 tablets (17.2 mg total) by mouth at bedtime. 180 tablet 1   Simethicone 80 MG TABS Take 1 tablet (80 mg total) by mouth 4 (four) times daily. 20 tablet 0   vitamin B-12 (CYANOCOBALAMIN) 1000 MCG tablet Take 1,000 mcg by mouth daily.     No current facility-administered medications for this visit.     REVIEW OF SYSTEMS:   A 10+ POINT REVIEW OF SYSTEMS WAS OBTAINED including neurology, dermatology, psychiatry, cardiac, respiratory, lymph,  extremities, GI, GU, Musculoskeletal, constitutional, breasts, reproductive, HEENT.  All pertinent positives are noted in the HPI.  All others are negative.   PHYSICAL EXAMINATION: ECOG PERFORMANCE STATUS: 2 - Symptomatic, <50% confined to bed  . Vitals:   01/13/19 1457  BP: (!) 143/100  Pulse: (!) 113  Resp: 18  Temp: 99.2 F (37.3 C)  SpO2: 98%   Filed  Weights   01/13/19 1457  Weight: 159 lb 9.6 oz (72.4 kg)   .Body mass index is 23.57 kg/m.   GENERAL:alert, in no acute distress and comfortable SKIN: no acute rashes, no significant lesions EYES: conjunctiva are pink and non-injected, sclera anicteric OROPHARYNX: MMM, no exudates, no oropharyngeal erythema or ulceration NECK: supple, no JVD LYMPH:  no palpable lymphadenopathy in the cervical, axillary or inguinal regions LUNGS: clear to auscultation b/l with normal respiratory effort HEART: regular rate & rhythm ABDOMEN:  normoactive bowel sounds , non tender, not distended. No palpable hepatosplenomegaly.  Extremity: no pedal edema PSYCH: alert & oriented x 3 with fluent speech NEURO: no focal motor/sensory deficits  LABORATORY DATA:  I have reviewed the data as listed  . CBC Latest Ref Rng & Units 01/13/2019 01/08/2019 12/26/2018  WBC 4.0 - 10.5 K/uL 7.9 6.7 10.5  Hemoglobin 13.0 - 17.0 g/dL 11.3(L) 10.7(L) 10.2(L)  Hematocrit 39.0 - 52.0 % 33.1(L) 33.3(L) 31.4(L)  Platelets 150 - 400 K/uL 367 415(H) 315    . CMP Latest Ref Rng & Units 01/13/2019 01/08/2019 12/26/2018  Glucose 70 - 99 mg/dL 138(H) 110(H) 103(H)  BUN 8 - 23 mg/dL 29(H) 29(H) 34(H)  Creatinine 0.61 - 1.24 mg/dL 1.56(H) 1.51(H) 1.75(H)  Sodium 135 - 145 mmol/L 134(L) 136 139  Potassium 3.5 - 5.1 mmol/L 4.3 3.9 3.5  Chloride 98 - 111 mmol/L 103 105 108  CO2 22 - 32 mmol/L 18(L) 20(L) 20(L)  Calcium 8.9 - 10.3 mg/dL 9.8 9.4 7.9(L)  Total Protein 6.5 - 8.1 g/dL 5.6(L) - -  Total Bilirubin 0.3 - 1.2 mg/dL 0.4 - -  Alkaline Phos 38 - 126 U/L 92 - -  AST 15 - 41 U/L 19 - -  ALT 0 - 44 U/L 11 - -   01/08/2019 Flow Pathology Report ([id number])   01/08/2019 BM Bx Report ([id number])   12/18/2018 & 12/23/2018 CBC  Component     Latest Ref Rng & Units 12/18/2018 12/23/2018  WBC     4.0 - 10.5 K/uL 6.5 13.8 (H)  RBC     4.22 - 5.81 MIL/uL 3.61 (L) 3.99 (L)  Hemoglobin     13.0 - 17.0 g/dL 11.0 (L)  12.0 (L)  HCT     39.0 - 52.0 % 32.7 (L) 36.9 (L)  MCV     80.0 - 100.0 fL 90.4 92.5  MCH     26.0 - 34.0 pg  30.1  MCHC     30.0 - 36.0 g/dL 33.7 32.5  RDW     11.5 - 15.5 % 14.5 14.6  Platelets     150 - 400 K/uL 301.0 369  nRBC     0.0 - 0.2 %  0.0  Neutrophils     %  90  NEUT#     1.7 - 7.7 K/uL  12.2 (H)  Lymphocytes     %  4  Lymphocyte #     0.7 - 4.0 K/uL  0.6 (L)  Monocytes Relative     %  6  Monocyte #     0.1 - 1.0  K/uL  0.9  Eosinophil     %  0  Eosinophils Absolute     0.0 - 0.5 K/uL  0.1  Basophil     %  0  Basophils Absolute     0.0 - 0.1 K/uL  0.1  Immature Granulocytes     %  0  Abs Immature Granulocytes     0.00 - 0.07 K/uL  0.05   12/18/2018 & 12/23/2018 BMP Component     Latest Ref Rng & Units 12/18/2018 12/23/2018  Sodium     135 - 145 mmol/L 140 136  Potassium     3.5 - 5.1 mmol/L 3.6 3.7  Chloride     98 - 111 mmol/L 106 100  CO2     22 - 32 mmol/L 25 23  Glucose     70 - 99 mg/dL 147 (H) 125 (H)  BUN     8 - 23 mg/dL 27 (H) 29 (H)  Creatinine     0.61 - 1.24 mg/dL 1.85 (H) 2.19 (H)  Calcium     8.9 - 10.3 mg/dL 10.4 9.2  GFR, Est Non African American     >60 mL/min  26 (L)  GFR, Est African American     >60 mL/min  31 (L)  Anion gap     5 - 15  13   12/09/2018 Protein electrophoresis serum  Component     Latest Ref Rng & Units 12/09/2018  Total Protein ELP     6.0 - 8.5 g/dL 4.9 (L)  Albumin ELP     2.9 - 4.4 g/dL 3.0  Alpha-1-Globulin     0.0 - 0.4 g/dL 0.2  Alpha-2-Globulin     0.4 - 1.0 g/dL 0.7  Beta Globulin     0.7 - 1.3 g/dL 0.6 (L)  Gamma Globulin     0.4 - 1.8 g/dL 0.3 (L)  M-SPIKE, %     Not Observed g/dL Not Observed  SPE Interp.      Comment  Comment      Comment  Globulin, Total     2.2 - 3.9 g/dL 1.9 (L)  A/G Ratio     0.7 - 1.7 1.6   12/09/2018 PTHP    12/09/2018 PTH & Total Calcium    12/11/2018 Vit D 25 Hydroxy     RADIOGRAPHIC STUDIES: I have personally reviewed the  radiological images as listed and agreed with the findings in the report. Ct Abdomen Pelvis Wo Contrast  Result Date: 12/23/2018 CLINICAL DATA:  85 year old male with acute abdominal and pelvic pain with distension. EXAM: CT ABDOMEN AND PELVIS WITHOUT CONTRAST TECHNIQUE: Multidetector CT imaging of the abdomen and pelvis was performed following the standard protocol without IV contrast. COMPARISON:  12/11/2018 FINDINGS: Please note that parenchymal abnormalities may be missed without intravenous contrast. Lower chest: Small bilateral pleural effusions and mild bibasilar atelectasis again noted. Hepatobiliary: The liver and gallbladder are unremarkable except for a stable hepatic cyst. No biliary dilatation. Pancreas: No definite abnormality Spleen: Heterogeneous enlarged spleen noted with irregular 10 cm hypoechoic area along the splenic hilum is worrisome for malignancy. Adrenals/Urinary Tract: Bilateral renal atrophy, moderate LEFT hydronephrosis and LEFT renal calculi again noted. The adrenal glands are unremarkable. Bladder is unremarkable. Stomach/Bowel: A very large amount of stool is noted within the proximal and transverse colon. There is a transition point at the splenic flexure at the irregular splenic mass with collapsed descending and sigmoid colon. No other definite bowel abnormalities are noted. Vascular/Lymphatic: Aortic  atherosclerosis. Enlarged retroperitoneal/iliac lymph nodes are noted with the largest lymph node measuring 3.2 cm (series 2: Image 20). Reproductive: Mild prostate enlargement noted. Other: A small amount of ascites within the abdomen and pelvis noted. No pneumoperitoneum. Musculoskeletal: No acute or suspicious bony abnormalities are noted. Multilevel degenerative disc disease, spondylosis and facet arthropathy within the lumbar spine noted. IMPRESSION: 1. 10 cm irregular mass centered in the medial spleen with abdominal/retroperitoneal adenopathy highly suspicious for malignancy  with metastatic spread. Small amount of ascites. 2. Very large amount of stool within the proximal transverse colon with relative colonic obstruction at the splenic flexure likely from the irregular splenic mass. No pneumoperitoneum. 3. Moderate LEFT hydronephrosis and LEFT renal calculi again noted. 4. Unchanged small bilateral pleural effusions and bibasilar atelectasis. 5.  Aortic Atherosclerosis (ICD10-I70.0). Electronically Signed   By: Margarette Canada M.D.   On: 12/23/2018 20:05   US Renal  Result Date: 12/25/2018 CLINICAL DATA:  Followup hydronephrosis EXAM: RENAL / URINARY TRACT ULTRASOUND COMPLETE COMPARISON:  CT 12/23/2018, ultrasound 12/10/2018 FINDINGS: Right Kidney: Renal measurements: 10.3 x 4.5 x 5.2 cm = volume: 127 mL . Echogenicity within normal limits. No mass or hydronephrosis visualized. Left Kidney: Renal measurements: 9.7 x 5.5 x 4.9 cm = volume: 136 mL. Echogenicity within normal limits. Moderate hydronephrosis. Scattered echogenic foci within the left kidney compatible with known renal calculi. Bladder: Bladder is mildly distended and appears within normal limits. Right ureteral jet was seen. No left ureteral jet was visualized during the course of the examination. Other: Small volume ascites. Irregular splenic mass, better characterized on recent CT. IMPRESSION: 1. Left-sided nephrolithiasis with moderate left hydronephrosis. No left-sided ureteral jet was seen. 2. Small volume ascites. 3. Partially visualized splenic mass. Electronically Signed   By: Davina Poke M.D.   On: 12/25/2018 11:34   Nm Pet Image Initial (pi) Skull Base To Thigh  Result Date: 01/05/2019 CLINICAL DATA:  Initial treatment strategy for lymphoma. EXAM: NUCLEAR MEDICINE PET SKULL BASE TO THIGH TECHNIQUE: 8.0 mCi F-18 FDG was injected intravenously. Full-ring PET imaging was performed from the skull base to thigh after the radiotracer. CT data was obtained and used for attenuation correction and anatomic  localization. Fasting blood glucose: 108 mg/dl COMPARISON:  Multiple exams, including CT abdomen from 12/23/2018 and CT chest from 12/11/2018 FINDINGS: Mediastinal blood pool activity: SUV max 1.8 Liver activity: SUV max 2.4 NECK: No significant abnormal hypermetabolic activity in this region. Incidental CT findings: Chronic left maxillary sinusitis. CHEST: Left supraclavicular lymph node 0.7 cm in short axis on image 47/4, maximum SUV 3.1, Deauville 4. Small AP window lymph node measuring 1.0 cm short axis on image 66/4, maximum SUV 5.5, Deauville 4. Left internal mammary lymph node, 0.9 cm in short axis on image 66/4, maximum SUV 3.0, Deauville 4. A 0.7 cm right upper lobe nodule on image 23/8 has a maximum SUV of 1.8, Deauville 2. Other smaller nodules are not definitely hypermetabolic but are below sensitive PET-CT size thresholds. Mostly photopenic large left and moderate right pleural effusions, increased from prior. Incidental CT findings: Scattered mild atelectasis in addition to the passive atelectasis. Coronary, aortic arch, and branch vessel atherosclerotic vascular disease. ABDOMEN/PELVIS: Marked hypermetabolic activity throughout the spleen, with photopenia corresponding to the hypodensity centrally in the spleen, maximum SUV 22.0 (Deauville 5). Abnormal masslike appearance along the splenic hilum and pancreatic tail likewise highly hypermetabolic conglomerate with adenopathy anterior to the abdominal aorta. Left periaortic lymph nodes measure up to 1.8 cm in short axis individually, maximum  SUV 19.6 (Deauville 5). There is abnormal prominence of stool in the proximal colon which expense into the region of the left upper quadrant mass. The colon is obscured in this vicinity but the descending colon is of normal caliber, accordingly the left upper quadrant mass is causing some degree of relative partial obstruction. Invasion into the splenic flexure is not excluded. Mass origination from the splenic  flexure is not excluded. Moderate ascites with low-grade metabolic activity, maximum SUV about 2.5. 3.3 cm complex lesion of the left kidney upper pole has low-grade metabolic activity, maximum SUV 3.9, suspicious for a mass. Nodularity along the omentum, but without hypermetabolic activity. This may be vascular but merit surveillance. Incidental CT findings: Hepatic cyst. Aortoiliac atherosclerotic vascular disease. Left hydronephrosis probably related to left UPJ obstruction possibly by a band of tumor. Multiple left renal calculi measuring up to 1.2 cm in diameter. SKELETON: There are few scattered foci of hypermetabolic skeletal activity. Right anterior third and fourth rib fractures are identified, both with faintly accentuated metabolic activity. Inferiorly in the right scapular there is focally accentuated metabolic activity, maximum SUV 5.2 (Deauville 4). There is a soft tissue nodule along the inferior margin of the left twelfth rib measuring 2.0 by 1.4 cm on image 128/4 with maximum SUV 11.9, Deauville 5. A small lesion in the right side of the T7 vertebra has maximum SUV 6.8, Deauville 4. Incidental CT findings: none IMPRESSION: 1. 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 Most of this is Deauville 5 activity in this region. 2. Deauville 4 lymph nodes in the chest. Deauville 2 right upper lobe pulmonary nodule. 3. Abnormal foci of skeletal activity favoring malignancy, including the inferior right scapula (Deauville 4), right T7 vertebral body (Deauville 4), and a soft tissue nodule along the margin of the left twelfth rib (Deauville 5). There also likely benign anterior rib fractures of the right third and fourth rib with low-grade activity. 4. Likely partial obstruction of the colon at the  splenic flexure where it becomes involved in the left upper quadrant mass. Invasion of the colon is not excluded, and strictly speaking, a primary malignancy of the sigmoid flexure is not excluded. 5. 3.3 cm complex lesion of the left kidney upper pole has low-grade metabolic activity, suspicious for a mass. 6. Moderate ascites with low-grade metabolic activity, malignant ascites is not excluded. There is some nodularity along the omentum but without significant hypermetabolic activity at this time. 7. Other imaging findings of potential clinical significance: Chronic left maxillary sinusitis. Large left and moderate right pleural effusions, increased from prior CT. Aortic Atherosclerosis (ICD10-I70.0). Coronary atherosclerosis. 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 Multiple left-sided renal calculi. Electronically Signed   By: Van Clines M.D.   On: 01/05/2019 16:13   Ct Biopsy  Result Date: 01/08/2019 CLINICAL DATA:  Splenic mass, retroperitoneal lymphadenopathy and clinical suspicion lymphoma. Bone marrow biopsy has been requested for further workup. EXAM: CT GUIDED BONE MARROW ASPIRATION AND BIOPSY ANESTHESIA/SEDATION: Versed 1.5 mg IV, Fentanyl 50 mcg IV Total Moderate Sedation Time:   12 minutes. The patient's level of consciousness and physiologic status were continuously monitored during the procedure by Radiology nursing. PROCEDURE: The procedure risks, benefits, and alternatives were explained to the patient. Questions regarding the procedure were encouraged and answered. The patient understands and  consents to the procedure. A time out was performed prior to initiating the procedure. The right gluteal region was prepped with chlorhexidine. Sterile gown and sterile gloves were used for the procedure. Local anesthesia was provided with 1% Lidocaine. Under CT guidance, an 11 gauge On Control bone cutting needle was advanced from a posterior approach into the  right iliac bone. Needle positioning was confirmed with CT. Initial non heparinized and heparinized aspirate samples were obtained of bone marrow. Core biopsy was performed via the On Control drill needle. COMPLICATIONS: None FINDINGS: Inspection of initial aspirate did reveal visible particles. Intact core biopsy sample was obtained. IMPRESSION: CT guided bone marrow biopsy of right posterior iliac bone with both aspirate and core samples obtained. Electronically Signed   By: Aletta Edouard M.D.   On: 01/08/2019 13:31   Dg Abd 2 Views  Result Date: 01/01/2019 CLINICAL DATA:  Constipation. EXAM: ABDOMEN - 2 VIEW COMPARISON:  Radiographs 12/24/2018 FINDINGS: The lung bases are grossly clear.  Minimal left basilar atelectasis. Some scattered stool in the colon but no significant stool burden. Few scattered small bowel loops with air but no distension. The soft tissue shadows of the abdomen are maintained. No worrisome calcifications. Stable scoliosis and degenerative changes in the spine. IMPRESSION: 1. No plain film findings for an acute abdominal process. 2. No significant stool burden. Electronically Signed   By: Marijo Sanes M.D.   On: 01/01/2019 15:26   Dg Abd Portable 1v  Result Date: 12/24/2018 CLINICAL DATA:  Constipation. EXAM: PORTABLE ABDOMEN - 1 VIEW COMPARISON:  CT yesterday, CT 12/11/2018 also reviewed FINDINGS: Gaseous distension of ascending and transverse colon, similar to CT yesterday. Small volume of formed stool. More distal colon is collapsed and not well evaluated on radiograph. No evidence of free air on single supine view. No small bowel dilatation. Left renal calculi on prior CT are faintly visualized. IMPRESSION: Gaseous distension of the ascending and transverse colon, similar to CT yesterday, question external compression from splenic lesion. Small volume of formed stool within the distended colon. No small bowel dilatation or small bowel obstruction. Electronically Signed   By:  Keith Rake M.D.   On: 12/24/2018 19:35   Ct Bone Marrow Biopsy & Aspiration  Result Date: 01/08/2019 CLINICAL DATA:  Splenic mass, retroperitoneal lymphadenopathy and clinical suspicion lymphoma. Bone marrow biopsy has been requested for further workup. EXAM: CT GUIDED BONE MARROW ASPIRATION AND BIOPSY ANESTHESIA/SEDATION: Versed 1.5 mg IV, Fentanyl 50 mcg IV Total Moderate Sedation Time:   12 minutes. The patient's level of consciousness and physiologic status were continuously monitored during the procedure by Radiology nursing. PROCEDURE: The procedure risks, benefits, and alternatives were explained to the patient. Questions regarding the procedure were encouraged and answered. The patient understands and consents to the procedure. A time out was performed prior to initiating the procedure. The right gluteal region was prepped with chlorhexidine. Sterile gown and sterile gloves were used for the procedure. Local anesthesia was provided with 1% Lidocaine. Under CT guidance, an 11 gauge On Control bone cutting needle was advanced from a posterior approach into the right iliac bone. Needle positioning was confirmed with CT. Initial non heparinized and heparinized aspirate samples were obtained of bone marrow. Core biopsy was performed via the On Control drill needle. COMPLICATIONS: None FINDINGS: Inspection of initial aspirate did reveal visible particles. Intact core biopsy sample was obtained. IMPRESSION: CT guided bone marrow biopsy of right posterior iliac bone with both aspirate and core samples obtained. Electronically Signed  By: Aletta Edouard M.D.   On: 01/08/2019 13:31    ASSESSMENT & PLAN:   1) hypercalcemia related to elevated 1,25 [place] VitD -- concerning for NHL 2) Splenic mass, retroperitoneal lymphadenopathy and multiple pulmonary nodules -- likely concerning for lymphoma . Less likely granulomatous inflammation  09/17/020 CT C/A/P ([id number]) ([id number]) which revealed "CT of  the chest: Scattered nodular densities bilaterally which have increased in the interval from the prior exam particularly in the right upper lobe where a 10 mm mildly spiculated nodule is seen as described. Small pleural effusions are noted as well. Outpatient PET-CT may be helpful for further evaluation. Soft tissue densities are noted along the left lateral aspect of the sternum stable in appearance from the prior exam and likely benign in etiology. CT of the abdomen and pelvis: Mixed attenuation mass lesion within the spleen as described. This is larger than seen on prior examination and may simply represent an enlarged hemangioma. The evaluation is somewhat limited due to lack of IV contrast. More aggressive process could not be totally excluded. Left renal calculi with evidence of left-sided hydronephrosis. This is new from the prior exam but similar to that seen on recent ultrasound. No obstructing lesion or stone is noted. Mild ascites."  12/12/2018 PET Scan Whole Body ([id number]) which revealed "1. No focal abnormality over the right clavicle to account for patient's pain. 2. Two adjacent foci of increased tracer activity over the right third and fourth anterior rib ends likely posttraumatic as metastatic disease is much less likely. 3. Degenerative changes of the appendicular skeleton. 4. Left hydronephrosis.  3) left sided hydronephrosis - unclear etiology  12/10/2018 Renal US ([id number]) which revealed "Left-sided hydronephrosis. Bilateral effusions and mild ascites. Increased echogenicity in the right kidney is noted consistent with medical renal disease. Mild increased echogenicity is noted on the left. Findings suspicious for a mass within the spleen. This may simply represent a large hemangioma. Contrast enhanced CT or MRI may be helpful for further evaluation."   PLAN:  -Discussed pt labwork today, 01/13/19; all values are WNL except for RBC at 3.76, Hgb at 11.3, HCT at 33.1, Lymphs Abs at  0.5K, Sodium at 134, CO2 at 18, Glucose at 138, BUN at 29, Creatinine at 1.56, Total Protein at 5.6, Albumin at 2.7, GFR Est Non Af Am at 40. -Discussed 01/13/2019 LDH is at 210 -Discussed 01/05/2019 PET/CT Scan ([id number]) which revealed "1. 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 Most of this is Deauville 5 activity in this region. 2. Deauville 4 lymph nodes in the chest. Deauville 2 right upper lobe pulmonary nodule. 3. Abnormal foci of skeletal activity favoring malignancy, including the inferior right scapula (Deauville 4), right T7 vertebral body (Deauville 4), and a soft tissue nodule along the margin of the left twelfth rib (Deauville 5). There also likely benign anterior rib fractures of the right third and fourth rib with low-grade activity. 4. Likely partial obstruction of the colon at the splenic flexure where it becomes involved in the left upper quadrant mass. Invasion of the colon is not excluded, and strictly speaking, a primary malignancy of the sigmoid flexure is not excluded. 5. 3.3 cm complex lesion of the left kidney upper pole has low-grade metabolic activity, suspicious for a mass. 6. Moderate ascites with low-grade metabolic activity, malignant ascites is not  excluded. There is some nodularity along the omentum but without significant hypermetabolic activity at this time. 7. Other imaging findings of potential clinical significance: Chronic left maxillary sinusitis. Large left and moderate right pleural effusions, increased from prior CT. Aortic Atherosclerosis (ICD10-I70.0). Coronary atherosclerosis. 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 Multiple left-sided renal calculi." -Discussed 01/08/2019 Bone Marrow Report ([id number]) which  revealed "BONE MARROW, ASPIRATE, CLOT, CORE: -Variably cellular bone marrow with trilineage hematopoiesis -A few small lymphoid aggregates present PERIPHERAL BLOOD: -Normocytic -normochromic anemia -Thrombocytosis" -Discussed 01/08/2019 Flow Pathology Report ([id number]) which revealed "-No monoclonal B-cell population or abnormal T-cell phenotype identified." -Advised pt that he can take Tylenol for pain relief and to let us know if he needs additional help with pain management -Advised that kidney lesions are difficult to biopsy due to bleeding risks -Will see if Radiology could get biopsy of a bone lesion - pt will discuss with family and let us know if he would like to proceed  -Concern for attempting a surgicallaparoscopic biopsy due to pt's age and other medical conditions. -Patients family called back later and declined all further workup.   The total time spent in the appt was 40 minutes and more than 50% was on counseling and direct patient cares.  All of the patient's questions were answered with apparent satisfaction. The patient knows to call the clinic with any problems, questions or concerns.   Sullivan Lone MD [place] AAHIVMS [facility]On[facility] Phys[facility]  (Office):       [phone] (Work cell):  [phone] (Fax):           [phone]  01/13/2019 4:32 PM  I, Yevette Edwards, am acting as a scribe for Dr. Sullivan Lone.   .I have reviewed the above documentation for accuracy and completeness, and I agree with the above. Brunetta Genera MD

## 2019-01-12 NOTE — Telephone Encounter (Signed)
Error

## 2019-01-12 NOTE — Patient Instructions (Addendum)
For your constipation:  Continue docusate 100 mg three times a day Senna 2 tabs at bedtime miralax twice daily Bisacodyl - 5-10 mg daily   Please let me know how we do with this.

## 2019-01-13 ENCOUNTER — Inpatient Hospital Stay: Payer: Medicare HMO | Attending: Hematology | Admitting: Hematology

## 2019-01-13 ENCOUNTER — Other Ambulatory Visit: Payer: Self-pay

## 2019-01-13 ENCOUNTER — Inpatient Hospital Stay: Payer: Medicare HMO

## 2019-01-13 VITALS — BP 143/100 | HR 113 | Temp 99.2°F | Resp 18 | Ht 69.0 in | Wt 159.6 lb

## 2019-01-13 DIAGNOSIS — R161 Splenomegaly, not elsewhere classified: Secondary | ICD-10-CM | POA: Insufficient documentation

## 2019-01-13 DIAGNOSIS — Z7982 Long term (current) use of aspirin: Secondary | ICD-10-CM | POA: Insufficient documentation

## 2019-01-13 DIAGNOSIS — Z79899 Other long term (current) drug therapy: Secondary | ICD-10-CM | POA: Diagnosis not present

## 2019-01-13 DIAGNOSIS — R918 Other nonspecific abnormal finding of lung field: Secondary | ICD-10-CM

## 2019-01-13 DIAGNOSIS — Z801 Family history of malignant neoplasm of trachea, bronchus and lung: Secondary | ICD-10-CM | POA: Diagnosis not present

## 2019-01-13 DIAGNOSIS — Z87891 Personal history of nicotine dependence: Secondary | ICD-10-CM | POA: Insufficient documentation

## 2019-01-13 DIAGNOSIS — D649 Anemia, unspecified: Secondary | ICD-10-CM

## 2019-01-13 DIAGNOSIS — R590 Localized enlarged lymph nodes: Secondary | ICD-10-CM | POA: Insufficient documentation

## 2019-01-13 LAB — CMP (CANCER CENTER ONLY)
ALT: 11 U/L (ref 0–44)
AST: 19 U/L (ref 15–41)
Albumin: 2.7 g/dL — ABNORMAL LOW (ref 3.5–5.0)
Alkaline Phosphatase: 92 U/L (ref 38–126)
Anion gap: 13 (ref 5–15)
BUN: 29 mg/dL — ABNORMAL HIGH (ref 8–23)
CO2: 18 mmol/L — ABNORMAL LOW (ref 22–32)
Calcium: 9.8 mg/dL (ref 8.9–10.3)
Chloride: 103 mmol/L (ref 98–111)
Creatinine: 1.56 mg/dL — ABNORMAL HIGH (ref 0.61–1.24)
GFR, Est AFR Am: 46 mL/min — ABNORMAL LOW (ref >60)
GFR, Estimated: 40 mL/min — ABNORMAL LOW (ref >60)
Glucose, Bld: 138 mg/dL — ABNORMAL HIGH (ref 70–99)
Potassium: 4.3 mmol/L (ref 3.5–5.1)
Sodium: 134 mmol/L — ABNORMAL LOW (ref 135–145)
Total Bilirubin: 0.4 mg/dL (ref 0.3–1.2)
Total Protein: 5.6 g/dL — ABNORMAL LOW (ref 6.5–8.1)

## 2019-01-13 LAB — CBC WITH DIFFERENTIAL/PLATELET
Abs Immature Granulocytes: 0.05 10*3/uL (ref 0.00–0.07)
Basophils Absolute: 0 10*3/uL (ref 0.0–0.1)
Basophils Relative: 0 %
Eosinophils Absolute: 0.1 10*3/uL (ref 0.0–0.5)
Eosinophils Relative: 1 %
HCT: 33.1 % — ABNORMAL LOW (ref 39.0–52.0)
Hemoglobin: 11.3 g/dL — ABNORMAL LOW (ref 13.0–17.0)
Immature Granulocytes: 1 %
Lymphocytes Relative: 7 %
Lymphs Abs: 0.5 10*3/uL — ABNORMAL LOW (ref 0.7–4.0)
MCH: 30.1 pg (ref 26.0–34.0)
MCHC: 34.1 g/dL (ref 30.0–36.0)
MCV: 88 fL (ref 80.0–100.0)
Monocytes Absolute: 0.7 10*3/uL (ref 0.1–1.0)
Monocytes Relative: 9 %
Neutro Abs: 6.6 10*3/uL (ref 1.7–7.7)
Neutrophils Relative %: 82 %
Platelets: 367 10*3/uL (ref 150–400)
RBC: 3.76 MIL/uL — ABNORMAL LOW (ref 4.22–5.81)
RDW: 14.6 % (ref 11.5–15.5)
WBC: 7.9 10*3/uL (ref 4.0–10.5)
nRBC: 0 % (ref 0.0–0.2)

## 2019-01-13 LAB — LACTATE DEHYDROGENASE: LDH: 210 U/L — ABNORMAL HIGH (ref 98–192)

## 2019-01-14 ENCOUNTER — Telehealth: Payer: Self-pay

## 2019-01-14 ENCOUNTER — Telehealth: Payer: Self-pay | Admitting: Hematology

## 2019-01-14 DIAGNOSIS — R609 Edema, unspecified: Secondary | ICD-10-CM

## 2019-01-14 MED ORDER — FUROSEMIDE 20 MG PO TABS: 20.0000 mg | ORAL_TABLET | Freq: Every day | ORAL | 0 refills | Status: AC | PRN

## 2019-01-14 NOTE — Telephone Encounter (Signed)
No los per 10/20.

## 2019-01-14 NOTE — Telephone Encounter (Signed)
Medications have been sent. LVM letting pt know .

## 2019-01-14 NOTE — Telephone Encounter (Signed)
Copied from [place] [id number]. Topic: General - Other >> Jan 14, 2019  1:27 PM Yvette Rack wrote: Reason for CRM: Pt son called in and stated that pt threw out the furosemide (LASIX) 20 MG tablet when Dr. Quay Burow told him to stop taking it but pt was recently told to start back taking the medication. Pt son asked that either a new Rx or refill request be sent to [place] at [facility]

## 2019-01-15 ENCOUNTER — Telehealth: Payer: Self-pay | Admitting: Internal Medicine

## 2019-01-15 ENCOUNTER — Encounter (HOSPITAL_COMMUNITY): Payer: Self-pay | Admitting: Hematology

## 2019-01-15 ENCOUNTER — Telehealth: Payer: Self-pay | Admitting: *Deleted

## 2019-01-15 ENCOUNTER — Telehealth: Payer: Self-pay

## 2019-01-15 DIAGNOSIS — I700 Atherosclerosis of aorta: Secondary | ICD-10-CM | POA: Diagnosis not present

## 2019-01-15 DIAGNOSIS — I2510 Atherosclerotic heart disease of native coronary artery without angina pectoris: Secondary | ICD-10-CM | POA: Diagnosis not present

## 2019-01-15 DIAGNOSIS — N200 Calculus of kidney: Secondary | ICD-10-CM | POA: Diagnosis not present

## 2019-01-15 DIAGNOSIS — D649 Anemia, unspecified: Secondary | ICD-10-CM | POA: Diagnosis not present

## 2019-01-15 DIAGNOSIS — I10 Essential (primary) hypertension: Secondary | ICD-10-CM | POA: Diagnosis not present

## 2019-01-15 DIAGNOSIS — K56609 Unspecified intestinal obstruction, unspecified as to partial versus complete obstruction: Secondary | ICD-10-CM | POA: Diagnosis not present

## 2019-01-15 DIAGNOSIS — K5900 Constipation, unspecified: Secondary | ICD-10-CM | POA: Diagnosis not present

## 2019-01-15 DIAGNOSIS — M47816 Spondylosis without myelopathy or radiculopathy, lumbar region: Secondary | ICD-10-CM | POA: Diagnosis not present

## 2019-01-15 DIAGNOSIS — R161 Splenomegaly, not elsewhere classified: Secondary | ICD-10-CM | POA: Diagnosis not present

## 2019-01-15 DIAGNOSIS — C799 Secondary malignant neoplasm of unspecified site: Secondary | ICD-10-CM

## 2019-01-15 DIAGNOSIS — M5136 Other intervertebral disc degeneration, lumbar region: Secondary | ICD-10-CM | POA: Diagnosis not present

## 2019-01-15 NOTE — Telephone Encounter (Signed)
Referral ordered

## 2019-01-15 NOTE — Telephone Encounter (Signed)
Received message from son [name] stating that following father's appt on 10/20 the patient has made a decision not to continue diagnostic testing/care and does not want to have a biopsy. Son (as [place]) is now trying to make a decision about hospice care for father. States he called hospice and was told they needed following info: patient's diagnosis; is it terminal/does he have less than 6 months to live? Information and question given to Dr.Kale.  Per Dr.Kale: Without a biopsy, a definitive diagnosis of what type of cancer cannot be made and Dr.Kale is unable to give prognosis. It might be possible to refer to hospice care with general diagnosis of malignancy, but they may not accept a referral without a specific diagnosis. Dr. Irene Limbo stated he would support family if whatever decision they made and assist as appropriate.  Contacted son with Dr.Kale's statement as above. Son states that father is just very tired and has had multiple tests and just does not want to pursue treatment. He states he will call patient's PCP to discuss Hospice referral as she may be able to make one with regards to other ongoing health conditions. Advised son to contact Dr.Kale's office as needed. He verbalized understanding and stated appreciation for call.

## 2019-01-15 NOTE — Telephone Encounter (Signed)
Pts son states that he is not going to seek any further treatment for his condition. He is at a point where he has had enough and he is looking into getting hospice care. He is not eating and not doing well at this point. Would like a referral.

## 2019-01-15 NOTE — Telephone Encounter (Signed)
Copied from [place] [id number]. Topic: General - Other >> Jan 15, 2019 12:30 PM Burchel, Abbi R wrote: Reason for CRM: Pt's son would like to discuss options for Hospice Care for his father.  Please call:  [name]: [phone]

## 2019-01-15 NOTE — Telephone Encounter (Signed)
Ricardo Sloan ([place]) states that pt will be s/c from PT services as he does not feel he is able to complete the program.  Ricardo Sloan also states that pt has very poor apetite and is unable to eat/drink much.  She also states that pt's heart rate is 106 at rest during her visit today. Please call Ricardo Sloan if further details/discussion is needed: [phone]

## 2019-01-16 NOTE — Telephone Encounter (Signed)
Called Hospice to put in referral.

## 2019-01-21 DIAGNOSIS — Z6823 Body mass index (BMI) 23.0-23.9, adult: Secondary | ICD-10-CM

## 2019-01-21 DIAGNOSIS — N400 Enlarged prostate without lower urinary tract symptoms: Secondary | ICD-10-CM

## 2019-01-21 DIAGNOSIS — N183 Chronic kidney disease, stage 3 unspecified: Secondary | ICD-10-CM | POA: Diagnosis not present

## 2019-01-21 DIAGNOSIS — I509 Heart failure, unspecified: Secondary | ICD-10-CM | POA: Diagnosis not present

## 2019-01-21 DIAGNOSIS — K219 Gastro-esophageal reflux disease without esophagitis: Secondary | ICD-10-CM

## 2019-01-21 DIAGNOSIS — Z87891 Personal history of nicotine dependence: Secondary | ICD-10-CM

## 2019-01-21 DIAGNOSIS — I2510 Atherosclerotic heart disease of native coronary artery without angina pectoris: Secondary | ICD-10-CM | POA: Diagnosis not present

## 2019-01-21 DIAGNOSIS — J309 Allergic rhinitis, unspecified: Secondary | ICD-10-CM

## 2019-01-21 DIAGNOSIS — K449 Diaphragmatic hernia without obstruction or gangrene: Secondary | ICD-10-CM

## 2019-01-21 DIAGNOSIS — I1310 Hypertensive heart and chronic kidney disease without heart failure, with stage 1 through stage 4 chronic kidney disease, or unspecified chronic kidney disease: Secondary | ICD-10-CM | POA: Diagnosis not present

## 2019-01-21 DIAGNOSIS — R161 Splenomegaly, not elsewhere classified: Secondary | ICD-10-CM | POA: Diagnosis not present

## 2019-01-21 DIAGNOSIS — E43 Unspecified severe protein-calorie malnutrition: Secondary | ICD-10-CM | POA: Diagnosis not present

## 2019-01-21 DIAGNOSIS — Z741 Need for assistance with personal care: Secondary | ICD-10-CM

## 2019-01-21 DIAGNOSIS — E785 Hyperlipidemia, unspecified: Secondary | ICD-10-CM | POA: Diagnosis not present

## 2019-01-21 DIAGNOSIS — R634 Abnormal weight loss: Secondary | ICD-10-CM | POA: Diagnosis not present

## 2019-01-21 DIAGNOSIS — C801 Malignant (primary) neoplasm, unspecified: Secondary | ICD-10-CM | POA: Diagnosis not present

## 2019-01-21 DIAGNOSIS — D631 Anemia in chronic kidney disease: Secondary | ICD-10-CM | POA: Diagnosis not present

## 2019-02-24 DEATH — deceased

## 2020-04-21 IMAGING — DX DG ABDOMEN 2V
2 series · 2 of 2 positions shown · non-contrast
Comparison: Radiographs 12/24/2018

CLINICAL DATA: Constipation.

EXAM:
ABDOMEN - 2 VIEW

[abdomen erect]
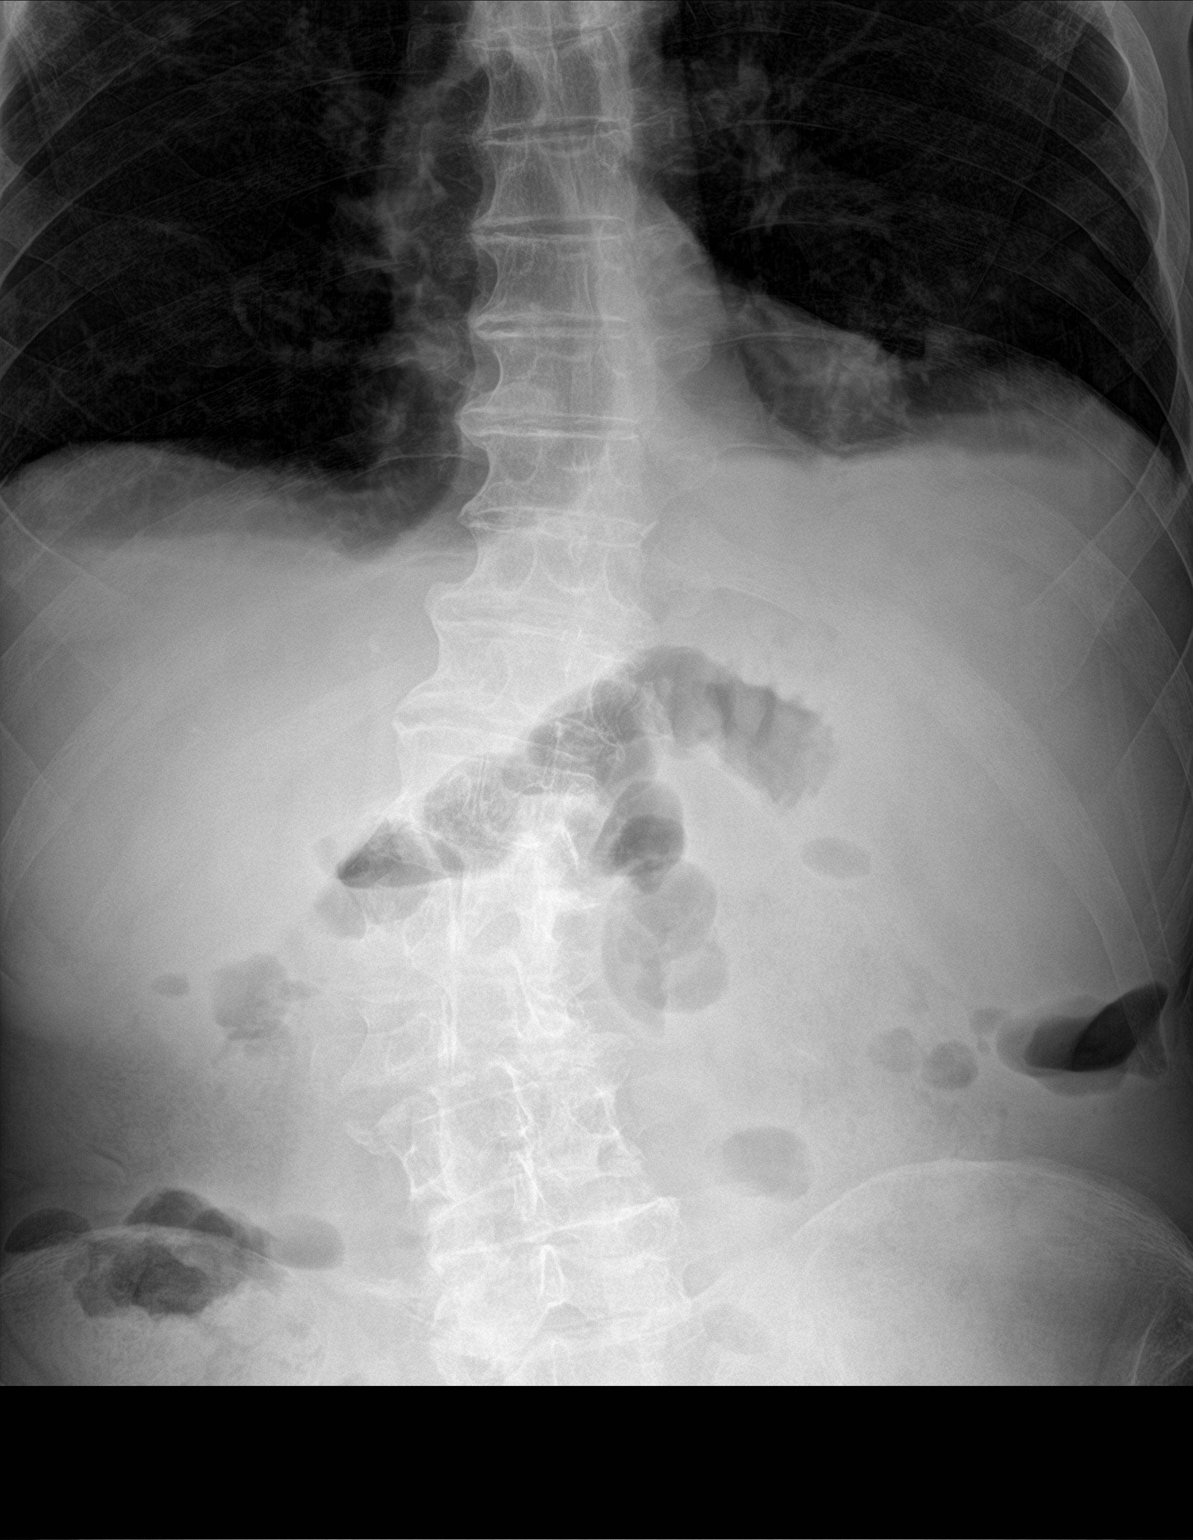

[abdomen supine]
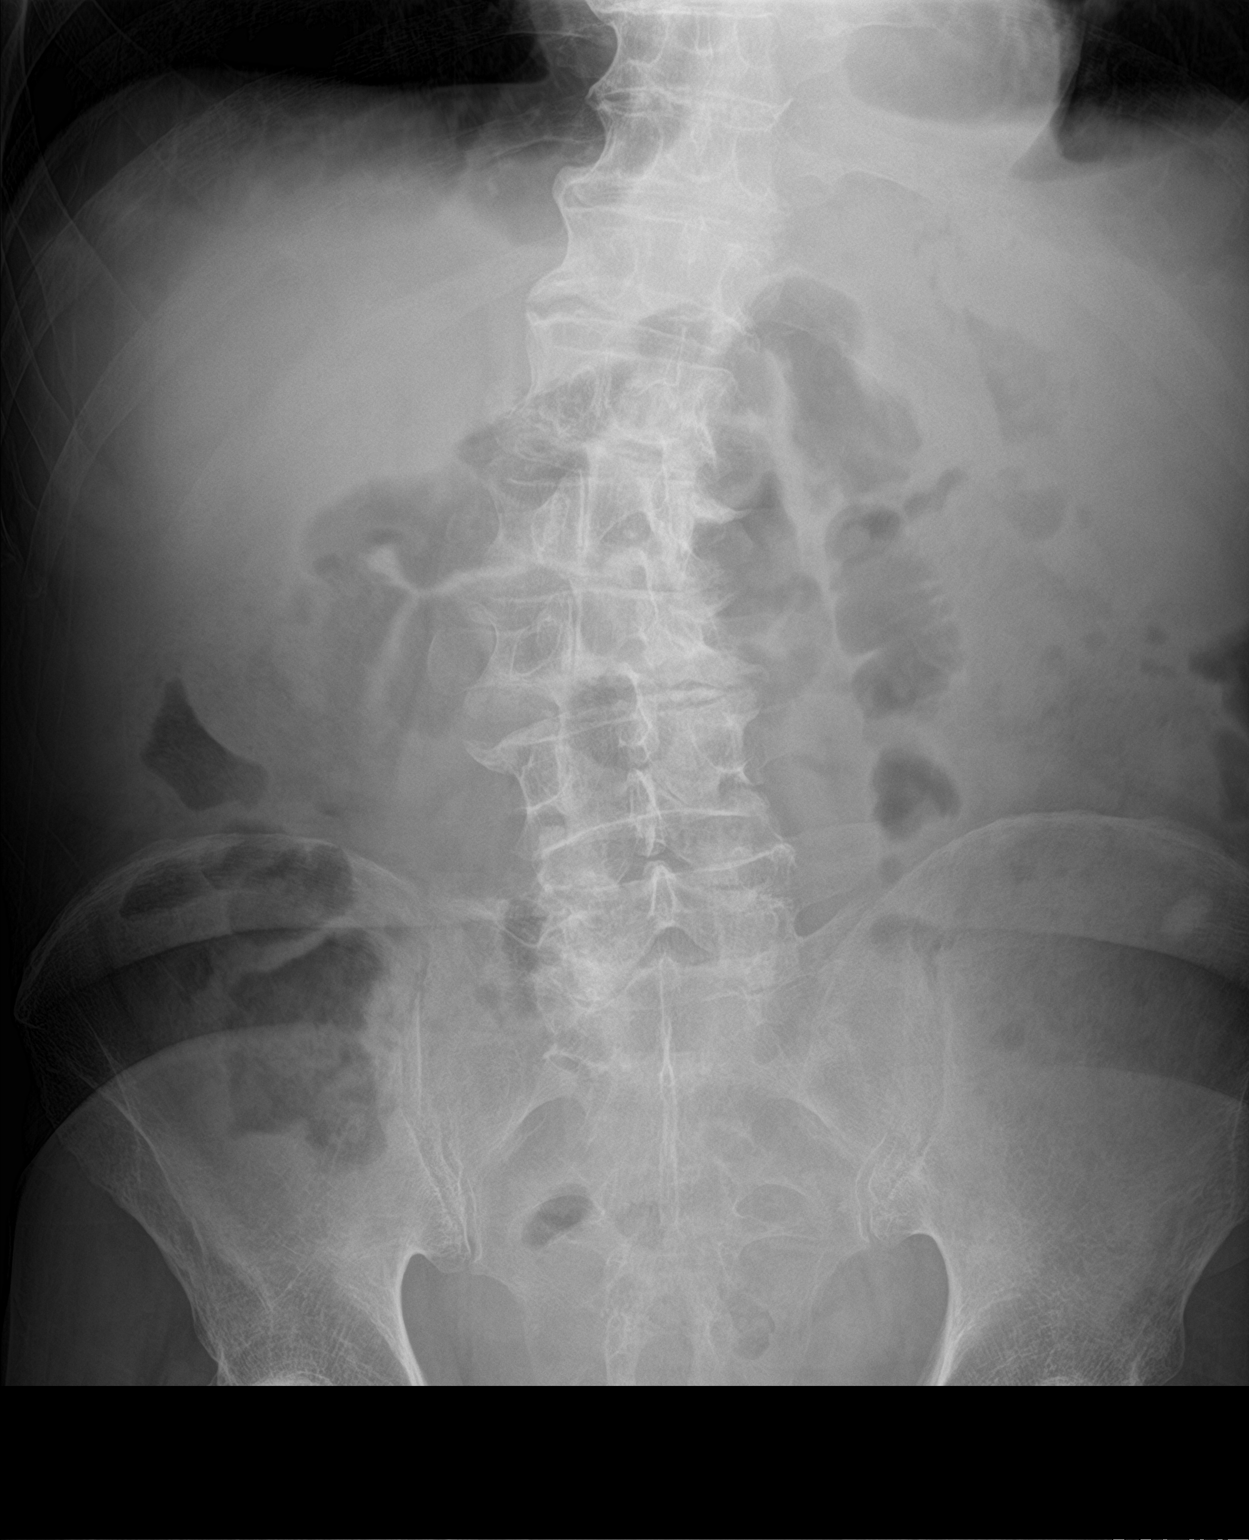

[2 of 2 positions shown; findings below may reference images not displayed]

FINDINGS: The lung bases are grossly clear.  Minimal left basilar atelectasis.

Some scattered stool in the colon but no significant stool burden.
Few scattered small bowel loops with air but no distension. The soft
tissue shadows of the abdomen are maintained. No worrisome
calcifications. Stable scoliosis and degenerative changes in the
spine.
IMPRESSION: 1. No plain film findings for an acute abdominal process.
2. No significant stool burden.

## 2020-04-25 IMAGING — PT NM PET TUM IMG INITIAL (PI) SKULL BASE T - THIGH
1 of 7 series · 1 of 25 positions shown · non-contrast
Comparison: Multiple exams, including CT abdomen from 12/23/2018
and CT chest from 12/11/2018

CLINICAL DATA: Initial treatment strategy for lymphoma.

EXAM:
NUCLEAR MEDICINE PET SKULL BASE TO THIGH
TECHNIQUE: 8.0 mCi F-18 FDG was injected intravenously. Full-ring PET imaging
was performed from the skull base to thigh after the radiotracer. CT
data was obtained and used for attenuation correction and anatomic
localization.
Fasting blood glucose: 108 mg/dl

[Series 4: ct sk_thigh 5.0 b31f · axial · 5.0mm · 0.98mm/px · 1 of 230 slices shown]
[im 230/230  brain]
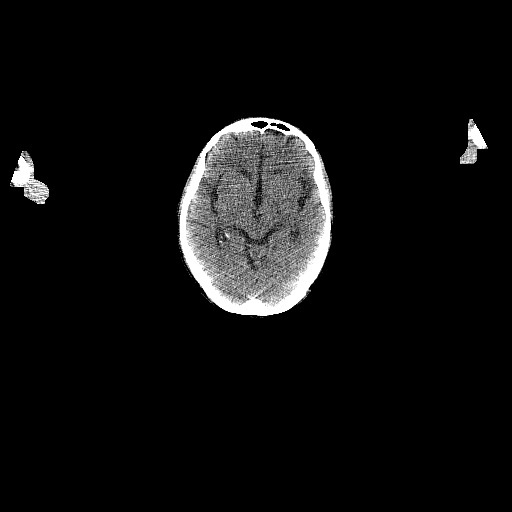

[1 of 25 positions shown; findings below may reference images not displayed]

FINDINGS: Mediastinal blood pool activity: SUV max

Liver activity: SUV max

NECK: No significant abnormal hypermetabolic activity in this
region.

Incidental CT findings: Chronic left maxillary sinusitis.

CHEST: Left supraclavicular lymph node 0.7 cm in short axis on image
47/4, maximum SUV 3.1, [HOSPITAL] 4.

Small AP window lymph node measuring 1.0 cm short axis on image
66/4, maximum SUV 5.5, [HOSPITAL] 4.

Left internal mammary lymph node, 0.9 cm in short axis on image
66/4, maximum SUV 3.0, [HOSPITAL] 4.

A 0.7 cm right upper lobe nodule on image [DATE] has a maximum SUV of
1.8, [HOSPITAL] 2. Other smaller nodules are not definitely
hypermetabolic but are below sensitive PET-CT size thresholds.

Mostly photopenic large left and moderate right pleural effusions,
increased from prior.

Incidental CT findings: Scattered mild atelectasis in addition to
the passive atelectasis. Coronary, aortic arch, and branch vessel
atherosclerotic vascular disease.

ABDOMEN/PELVIS: Marked hypermetabolic activity throughout the
spleen, with photopenia corresponding to the hypodensity centrally
in the spleen, maximum SUV 22.0 ([HOSPITAL] 5). Abnormal masslike
appearance along the splenic hilum and pancreatic tail likewise
highly hypermetabolic conglomerate with adenopathy anterior to the
abdominal aorta. Left periaortic lymph nodes measure up to 1.8 cm in
short axis individually, maximum SUV 19.6 ([HOSPITAL] 5).

There is abnormal prominence of stool in the proximal colon which
expense into the region of the left upper quadrant mass. The colon
is obscured in this vicinity but the descending colon is of normal
caliber, accordingly the left upper quadrant mass is causing some
degree of relative partial obstruction. Invasion into the splenic
flexure is not excluded. Mass origination from the splenic flexure
is not excluded.

Moderate ascites with low-grade metabolic activity, maximum SUV
about 2.5.

3.3 cm complex lesion of the left kidney upper pole has low-grade
metabolic activity, maximum SUV 3.9, suspicious for a mass.

Nodularity along the omentum, but without hypermetabolic activity.
This may be vascular but merit surveillance.

Incidental CT findings: Hepatic cyst. Aortoiliac atherosclerotic
vascular disease. Left hydronephrosis probably related to left UPJ
obstruction possibly by a band of tumor. Multiple left renal calculi
measuring up to 1.2 cm in diameter.

SKELETON: There are few scattered foci of hypermetabolic skeletal
activity. Right anterior third and fourth rib fractures are
identified, both with faintly accentuated metabolic activity.
Inferiorly in the right scapular there is focally accentuated
metabolic activity, maximum SUV 5.2 ([HOSPITAL] 4). There is a soft
tissue nodule along the inferior margin of the left twelfth rib
measuring 2.0 by 1.4 cm on image 128/4 with maximum SUV 11.9,
[HOSPITAL] 5. A small lesion in the right side of the T7 vertebra has
maximum SUV 6.8, [HOSPITAL] 4.

Incidental CT findings: none
IMPRESSION: 1. Extensive [HOSPITAL] 5 activity involving the spleen with central
necrosis and splenomegaly, as well as masslike extension along the
splenic hilum and tail the pancreas along the left anterior
perirenal space and into the periaortic and retroperitoneal region.
Most of this is [HOSPITAL] 5 activity in this region.
2. [HOSPITAL] 4 lymph nodes in the chest. [HOSPITAL] 2 right upper
lobe pulmonary nodule.
3. Abnormal foci of skeletal activity favoring malignancy, including
the inferior right scapula ([HOSPITAL] 4), right T7 vertebral body
([HOSPITAL] 4), and a soft tissue nodule along the margin of the left
twelfth rib ([HOSPITAL] 5). There also likely benign anterior rib
fractures of the right third and fourth rib with low-grade activity.
4. Likely partial obstruction of the colon at the splenic flexure
where it becomes involved in the left upper quadrant mass. Invasion
of the colon is not excluded, and strictly speaking, a primary
malignancy of the sigmoid flexure is not excluded.
5. 3.3 cm complex lesion of the left kidney upper pole has low-grade
metabolic activity, suspicious for a mass.
6. Moderate ascites with low-grade metabolic activity, malignant
ascites is not excluded. There is some nodularity along the omentum
but without significant hypermetabolic activity at this time.
7. Other imaging findings of potential clinical significance:
Chronic left maxillary sinusitis. Large left and moderate right
pleural effusions, increased from prior CT. Aortic Atherosclerosis
([id number]). Coronary atherosclerosis. Left hydronephrosis
probably from left UPJ stone obstruction due to tumor. Multiple
left-sided renal calculi.
